# Patient Record
Sex: Female | Born: 1945 | Race: White | Hispanic: No | Marital: Married
Health system: Southern US, Academic
[De-identification: ages and names within clinical notes are randomized; demographics above are authoritative.]

## PROBLEM LIST (undated history)

## (undated) DIAGNOSIS — E7800 Pure hypercholesterolemia, unspecified: Secondary | ICD-10-CM

## (undated) DIAGNOSIS — J45909 Unspecified asthma, uncomplicated: Secondary | ICD-10-CM

## (undated) DIAGNOSIS — C50919 Malignant neoplasm of unspecified site of unspecified female breast: Secondary | ICD-10-CM

## (undated) DIAGNOSIS — R0600 Dyspnea, unspecified: Secondary | ICD-10-CM

## (undated) DIAGNOSIS — K219 Gastro-esophageal reflux disease without esophagitis: Secondary | ICD-10-CM

## (undated) DIAGNOSIS — T4145XA Adverse effect of unspecified anesthetic, initial encounter: Secondary | ICD-10-CM

## (undated) DIAGNOSIS — T7840XA Allergy, unspecified, initial encounter: Secondary | ICD-10-CM

## (undated) DIAGNOSIS — T8859XA Other complications of anesthesia, initial encounter: Secondary | ICD-10-CM

## (undated) DIAGNOSIS — K589 Irritable bowel syndrome without diarrhea: Secondary | ICD-10-CM

## (undated) DIAGNOSIS — M1990 Unspecified osteoarthritis, unspecified site: Secondary | ICD-10-CM

## (undated) DIAGNOSIS — J189 Pneumonia, unspecified organism: Secondary | ICD-10-CM

## (undated) DIAGNOSIS — R011 Cardiac murmur, unspecified: Secondary | ICD-10-CM

## (undated) DIAGNOSIS — F419 Anxiety disorder, unspecified: Secondary | ICD-10-CM

## (undated) DIAGNOSIS — Z8669 Personal history of other diseases of the nervous system and sense organs: Secondary | ICD-10-CM

## (undated) HISTORY — PX: SKIN CANCER EXCISION: SHX779

## (undated) HISTORY — PX: COLONOSCOPY: SHX174

## (undated) HISTORY — PX: BREAST EXCISIONAL BIOPSY: SUR124

## (undated) HISTORY — PX: CHOLECYSTECTOMY: SHX55

## (undated) HISTORY — PX: BREAST BIOPSY: SHX20

## (undated) HISTORY — DX: Allergy, unspecified, initial encounter: T78.40XA

## (undated) HISTORY — PX: HX GALL BLADDER SURGERY/CHOLE: SHX55

## (undated) HISTORY — DX: Pure hypercholesterolemia, unspecified: E78.00

---

## 1965-03-07 HISTORY — PX: DILATION AND CURETTAGE OF UTERUS: SHX78

## 2004-08-23 ENCOUNTER — Ambulatory Visit (HOSPITAL_COMMUNITY): Payer: Self-pay

## 2006-06-16 ENCOUNTER — Other Ambulatory Visit: Payer: Self-pay | Admitting: Surgery

## 2007-03-08 HISTORY — PX: SKIN CANCER EXCISION: SHX779

## 2008-03-07 HISTORY — PX: CHOLECYSTECTOMY: SHX55

## 2008-05-19 ENCOUNTER — Ambulatory Visit (HOSPITAL_COMMUNITY): Payer: Self-pay | Admitting: Specialist

## 2011-02-26 ENCOUNTER — Emergency Department (INDEPENDENT_AMBULATORY_CARE_PROVIDER_SITE_OTHER)
Admission: EM | Admit: 2011-02-26 | Discharge: 2011-02-26 | Disposition: A | Discharge status: Home / Self Care | Payer: Medicare Other | Source: Home / Self Care

## 2011-02-26 ENCOUNTER — Encounter: Payer: Self-pay | Admitting: *Deleted

## 2011-02-26 DIAGNOSIS — M533 Sacrococcygeal disorders, not elsewhere classified: Secondary | ICD-10-CM

## 2011-02-26 HISTORY — DX: Pure hypercholesterolemia, unspecified: E78.00

## 2011-02-26 LAB — POCT URINALYSIS DIP (DEVICE)
Bilirubin Urine: NEGATIVE
Hgb urine dipstick: NEGATIVE
Ketones, ur: NEGATIVE mg/dL
Protein, ur: NEGATIVE mg/dL
Specific Gravity, Urine: 1.025 (ref 1.005–1.030)
pH: 5 (ref 5.0–8.0)

## 2011-02-26 MED ORDER — HYDROCODONE-ACETAMINOPHEN 5-325 MG PO TABS: 2.0000 | ORAL_TABLET | ORAL | Status: DC | PRN

## 2011-02-26 MED ORDER — HYDROCODONE-ACETAMINOPHEN 5-325 MG PO TABS: ORAL_TABLET | ORAL | Status: DC

## 2011-02-26 MED ORDER — DICLOFENAC SODIUM 75 MG PO TBEC: 75.0000 mg | DELAYED_RELEASE_TABLET | Freq: Two times a day (BID) | ORAL | Status: AC

## 2011-02-26 NOTE — ED Notes (Signed)
Pt c/o RIGHT sided lower back, flank, and hip pain x 3 days. Denies known injury/cause. Also reports "urinary problems. It just doesn't feel right when I go." Pt noted to be slow to ambulate.

## 2011-02-26 NOTE — ED Provider Notes (Signed)
Medical screening examination/treatment/procedure(s) were performed by non-physician practitioner and as supervising physician I was immediately available for consultation/collaboration.  LANEY,RONNIE   Ronnie Laney, MD 02/26/11 2109 

## 2011-02-26 NOTE — ED Provider Notes (Signed)
History     CSN: [id number]  Arrival date & time 02/26/11  1241   None     Chief Complaint  Patient presents with  . Back Pain  . Flank Pain  . Hip Pain    (Consider location/radiation/quality/duration/timing/severity/associated sxs/prior treatment) HPI Comments: Pt is visiting from [place] for the Christmas holiday. She began having Rt lower back pain 3 days ago. Pain is predominantly in one spot, with mild discomfort across her lower back. Pain is worse with movement and certain positions. And is also very tender to touch. No radiation to LE, and denies parasthesias. No abd pain, dysuria or urinary frequency.   Patient is a 65 y.o. female presenting with back pain, flank pain, and hip pain.  Back Pain  Pertinent negatives include no chest pain, no fever, no numbness, no abdominal pain and no dysuria.  Flank Pain Pertinent negatives include no chest pain, no abdominal pain and no shortness of breath.  Hip Pain Pertinent negatives include no chest pain, no abdominal pain and no shortness of breath.    Past Medical History  Diagnosis Date  . High cholesterol     History reviewed. No pertinent past surgical history.  Family History  Problem Relation Age of Onset  . Heart disease Other     History  Substance Use Topics  . Smoking status: Never Smoker   . Smokeless tobacco: Never Used  . Alcohol Use: Yes     Occasionally    OB History    Grav Para Term Preterm Abortions TAB SAB Ect Mult Living                  Review of Systems  Constitutional: Negative for fever and chills.  Respiratory: Negative for shortness of breath.   Cardiovascular: Negative for chest pain.  Gastrointestinal: Negative for nausea, vomiting and abdominal pain.  Genitourinary: Negative for dysuria, urgency, frequency and flank pain.  Musculoskeletal: Positive for back pain.  Neurological: Negative for numbness.    Allergies  Tetracyclines & related and [place]  Home  Medications   Current Outpatient Rx  Name Route Sig Dispense Refill  . ASPIRIN 81 MG PO TABS Oral Take 81 mg by mouth daily.      . ATORVASTATIN CALCIUM 40 MG PO TABS Oral Take 40 mg by mouth daily.      . CHOLESTYRAMINE 4 G PO PACK Oral Take 1 packet by mouth 1 day or 1 dose.      Marland Kitchen DICYCLOMINE HCL 10 MG PO CAPS Oral Take 10 mg by mouth 2 (two) times daily with a meal.      . DICLOFENAC SODIUM 75 MG PO TBEC Oral Take 1 tablet (75 mg total) by mouth 2 (two) times daily. 14 tablet 0  . HYDROCODONE-ACETAMINOPHEN 5-325 MG PO TABS  1/2 to one tablet every 6 hrs prn pain 10 tablet 0    BP 144/47  Pulse 61  Temp(Src) 98.7 F (37.1 C) (Oral)  Resp 18  SpO2 98%  Physical Exam  Nursing note and vitals reviewed. Constitutional: She appears well-developed and well-nourished. No distress.  Cardiovascular: Normal rate, regular rhythm and normal heart sounds.   Pulses:      Dorsalis pedis pulses are 2+ on the right side, and 2+ on the left side.       Posterior tibial pulses are 2+ on the right side, and 2+ on the left side.  Pulmonary/Chest: Effort normal and breath sounds normal. No respiratory distress.  Abdominal: Soft.  Bowel sounds are normal. She exhibits no distension and no mass. There is no tenderness.  Musculoskeletal: Normal range of motion.       Right hip: Normal.       Lumbar back: She exhibits tenderness. She exhibits no bony tenderness, no swelling, no deformity and no spasm.       Back:  Neurological: She has normal strength. Gait normal.  Reflex Scores:      Patellar reflexes are 2+ on the right side and 2+ on the left side.   ED Course  Procedures (including critical care time)  Labs Reviewed  POCT URINALYSIS DIP (DEVICE) - Abnormal; Notable for the following:    Leukocytes, UA SMALL (*) Biochemical Testing Only. Please order routine urinalysis from main lab if confirmatory testing is needed.   All other components within normal limits  POCT URINALYSIS DIPSTICK    URINE CULTURE   No results found.   1. Sacroiliac pain       MDM  UA small leuks. No urinary symptoms. Cx ordered.  Tender Rt SI joint non exam.        Melody Comas, PA 02/26/11 1538

## 2011-02-27 LAB — URINE CULTURE
Colony Count: NO GROWTH
Culture  Setup Time: 201212222155
Culture: NO GROWTH

## 2012-03-07 DIAGNOSIS — C50919 Malignant neoplasm of unspecified site of unspecified female breast: Secondary | ICD-10-CM | POA: Insufficient documentation

## 2012-03-07 HISTORY — DX: Malignant neoplasm of unspecified site of unspecified female breast: C50.919

## 2012-10-05 DIAGNOSIS — C50919 Malignant neoplasm of unspecified site of unspecified female breast: Secondary | ICD-10-CM

## 2012-10-05 HISTORY — PX: BREAST BIOPSY: SHX20

## 2012-10-05 HISTORY — DX: Malignant neoplasm of unspecified site of unspecified female breast: C50.919

## 2012-11-05 HISTORY — PX: BREAST LUMPECTOMY: SHX2

## 2012-11-20 ENCOUNTER — Ambulatory Visit (INDEPENDENT_AMBULATORY_CARE_PROVIDER_SITE_OTHER): Payer: Self-pay | Admitting: HEMATOLOGY/ONCOLOGY

## 2014-03-17 DIAGNOSIS — M79672 Pain in left foot: Secondary | ICD-10-CM | POA: Diagnosis not present

## 2014-03-17 DIAGNOSIS — M722 Plantar fascial fibromatosis: Secondary | ICD-10-CM | POA: Diagnosis not present

## 2014-03-17 DIAGNOSIS — M71572 Other bursitis, not elsewhere classified, left ankle and foot: Secondary | ICD-10-CM | POA: Diagnosis not present

## 2014-03-17 DIAGNOSIS — M7732 Calcaneal spur, left foot: Secondary | ICD-10-CM | POA: Diagnosis not present

## 2014-04-11 ENCOUNTER — Encounter (HOSPITAL_COMMUNITY): Payer: Self-pay | Admitting: Vascular Surgery

## 2014-04-11 ENCOUNTER — Inpatient Hospital Stay (HOSPITAL_COMMUNITY)
Admission: EM | Admit: 2014-04-11 | Discharge: 2014-04-12 | DRG: 871 | Disposition: A | Discharge status: Home / Self Care | Payer: Medicare Other | Attending: Internal Medicine | Admitting: Internal Medicine

## 2014-04-11 ENCOUNTER — Emergency Department (INDEPENDENT_AMBULATORY_CARE_PROVIDER_SITE_OTHER)
Admission: EM | Admit: 2014-04-11 | Discharge: 2014-04-11 | Disposition: A | Discharge status: Other Acute Inpatient Hospital | Payer: Medicare Other | Source: Home / Self Care | Attending: Emergency Medicine | Admitting: Emergency Medicine

## 2014-04-11 ENCOUNTER — Encounter (HOSPITAL_COMMUNITY): Payer: Self-pay

## 2014-04-11 ENCOUNTER — Emergency Department (INDEPENDENT_AMBULATORY_CARE_PROVIDER_SITE_OTHER): Payer: Medicare Other

## 2014-04-11 DIAGNOSIS — Z7982 Long term (current) use of aspirin: Secondary | ICD-10-CM | POA: Diagnosis not present

## 2014-04-11 DIAGNOSIS — K589 Irritable bowel syndrome without diarrhea: Secondary | ICD-10-CM | POA: Diagnosis present

## 2014-04-11 DIAGNOSIS — Z91013 Allergy to seafood: Secondary | ICD-10-CM

## 2014-04-11 DIAGNOSIS — Z888 Allergy status to other drugs, medicaments and biological substances status: Secondary | ICD-10-CM | POA: Diagnosis not present

## 2014-04-11 DIAGNOSIS — D0512 Intraductal carcinoma in situ of left breast: Secondary | ICD-10-CM

## 2014-04-11 DIAGNOSIS — N390 Urinary tract infection, site not specified: Secondary | ICD-10-CM | POA: Diagnosis present

## 2014-04-11 DIAGNOSIS — E780 Pure hypercholesterolemia: Secondary | ICD-10-CM | POA: Diagnosis present

## 2014-04-11 DIAGNOSIS — Z881 Allergy status to other antibiotic agents status: Secondary | ICD-10-CM | POA: Diagnosis not present

## 2014-04-11 DIAGNOSIS — A419 Sepsis, unspecified organism: Principal | ICD-10-CM | POA: Diagnosis present

## 2014-04-11 DIAGNOSIS — Z853 Personal history of malignant neoplasm of breast: Secondary | ICD-10-CM | POA: Diagnosis not present

## 2014-04-11 DIAGNOSIS — E785 Hyperlipidemia, unspecified: Secondary | ICD-10-CM | POA: Diagnosis present

## 2014-04-11 DIAGNOSIS — Z9049 Acquired absence of other specified parts of digestive tract: Secondary | ICD-10-CM | POA: Diagnosis present

## 2014-04-11 DIAGNOSIS — J189 Pneumonia, unspecified organism: Secondary | ICD-10-CM | POA: Diagnosis not present

## 2014-04-11 DIAGNOSIS — R0602 Shortness of breath: Secondary | ICD-10-CM | POA: Diagnosis not present

## 2014-04-11 DIAGNOSIS — R05 Cough: Secondary | ICD-10-CM | POA: Diagnosis not present

## 2014-04-11 DIAGNOSIS — C50919 Malignant neoplasm of unspecified site of unspecified female breast: Secondary | ICD-10-CM | POA: Diagnosis not present

## 2014-04-11 HISTORY — DX: Malignant neoplasm of unspecified site of unspecified female breast: C50.919

## 2014-04-11 LAB — COMPREHENSIVE METABOLIC PANEL
ALK PHOS: 53 U/L (ref 39–117)
ALT: 30 U/L (ref 0–35)
ANION GAP: 8 (ref 5–15)
AST: 36 U/L (ref 0–37)
Albumin: 3.5 g/dL (ref 3.5–5.2)
BUN: 14 mg/dL (ref 6–23)
CO2: 26 mmol/L (ref 19–32)
CREATININE: 1.27 mg/dL — AB (ref 0.50–1.10)
Calcium: 8.8 mg/dL (ref 8.4–10.5)
Chloride: 102 mmol/L (ref 96–112)
GFR calc Af Amer: 49 mL/min — ABNORMAL LOW (ref >90)
GFR, EST NON AFRICAN AMERICAN: 42 mL/min — AB (ref >90)
GLUCOSE: 136 mg/dL — AB (ref 70–99)
POTASSIUM: 3.9 mmol/L (ref 3.5–5.1)
SODIUM: 136 mmol/L (ref 135–145)
Total Bilirubin: 0.5 mg/dL (ref 0.3–1.2)
Total Protein: 6.8 g/dL (ref 6.0–8.3)

## 2014-04-11 LAB — I-STAT CHEM 8, ED
BUN: 14 mg/dL (ref 6–23)
CALCIUM ION: 1.06 mmol/L — AB (ref 1.13–1.30)
CHLORIDE: 101 mmol/L (ref 96–112)
Creatinine, Ser: 1.2 mg/dL — ABNORMAL HIGH (ref 0.50–1.10)
GLUCOSE: 135 mg/dL — AB (ref 70–99)
HCT: 39 % (ref 36.0–46.0)
HEMOGLOBIN: 13.3 g/dL (ref 12.0–15.0)
Potassium: 3.8 mmol/L (ref 3.5–5.1)
SODIUM: 136 mmol/L (ref 135–145)
TCO2: 21 mmol/L (ref 0–100)

## 2014-04-11 LAB — URINALYSIS, ROUTINE W REFLEX MICROSCOPIC
Bilirubin Urine: NEGATIVE
Glucose, UA: NEGATIVE mg/dL
Ketones, ur: 15 mg/dL — AB
Nitrite: NEGATIVE
PROTEIN: NEGATIVE mg/dL
SPECIFIC GRAVITY, URINE: 1.007 (ref 1.005–1.030)
UROBILINOGEN UA: 0.2 mg/dL (ref 0.0–1.0)
pH: 5.5 (ref 5.0–8.0)

## 2014-04-11 LAB — CBC WITH DIFFERENTIAL/PLATELET
BASOS ABS: 0 10*3/uL (ref 0.0–0.1)
Basophils Relative: 0 % (ref 0–1)
EOS ABS: 0 10*3/uL (ref 0.0–0.7)
EOS PCT: 0 % (ref 0–5)
HEMATOCRIT: 36.6 % (ref 36.0–46.0)
HEMOGLOBIN: 12.4 g/dL (ref 12.0–15.0)
LYMPHS PCT: 9 % — AB (ref 12–46)
Lymphs Abs: 0.7 10*3/uL (ref 0.7–4.0)
MCH: 32 pg (ref 26.0–34.0)
MCHC: 33.9 g/dL (ref 30.0–36.0)
MCV: 94.3 fL (ref 78.0–100.0)
MONOS PCT: 8 % (ref 3–12)
Monocytes Absolute: 0.6 10*3/uL (ref 0.1–1.0)
NEUTROS ABS: 6.4 10*3/uL (ref 1.7–7.7)
Neutrophils Relative %: 83 % — ABNORMAL HIGH (ref 43–77)
Platelets: 150 10*3/uL (ref 150–400)
RBC: 3.88 MIL/uL (ref 3.87–5.11)
RDW: 12.8 % (ref 11.5–15.5)
WBC: 7.7 10*3/uL (ref 4.0–10.5)

## 2014-04-11 LAB — URINE MICROSCOPIC-ADD ON

## 2014-04-11 LAB — POCT RAPID STREP A: Streptococcus, Group A Screen (Direct): NEGATIVE

## 2014-04-11 LAB — I-STAT CG4 LACTIC ACID, ED: Lactic Acid, Venous: 0.94 mmol/L (ref 0.5–2.0)

## 2014-04-11 MED ORDER — DOCUSATE SODIUM 100 MG PO CAPS
100.0000 mg | ORAL_CAPSULE | Freq: Two times a day (BID) | ORAL | Status: DC
  Filled 2014-04-11 (×3): qty 1

## 2014-04-11 MED ORDER — CHOLESTYRAMINE 4 G PO PACK
1.0000 | PACK | Freq: Every day | ORAL | Status: DC
  Filled 2014-04-11 (×2): qty 1

## 2014-04-11 MED ORDER — SODIUM CHLORIDE 0.9 % IV SOLN
INTRAVENOUS | Status: DC
  Administered 2014-04-11: 19:00:00 via INTRAVENOUS

## 2014-04-11 MED ORDER — GUAIFENESIN ER 600 MG PO TB12
600.0000 mg | ORAL_TABLET | Freq: Two times a day (BID) | ORAL | Status: DC
  Filled 2014-04-11 (×3): qty 1

## 2014-04-11 MED ORDER — ATORVASTATIN CALCIUM 40 MG PO TABS
40.0000 mg | ORAL_TABLET | Freq: Every day | ORAL | Status: DC
  Filled 2014-04-11 (×2): qty 1

## 2014-04-11 MED ORDER — ALBUTEROL SULFATE (2.5 MG/3ML) 0.083% IN NEBU: 2.0000 mL | INHALATION_SOLUTION | Freq: Four times a day (QID) | RESPIRATORY_TRACT | Status: DC | PRN

## 2014-04-11 MED ORDER — CEFTRIAXONE SODIUM IN DEXTROSE 20 MG/ML IV SOLN
1.0000 g | INTRAVENOUS | Status: DC
  Filled 2014-04-11: qty 50

## 2014-04-11 MED ORDER — TAMOXIFEN CITRATE 20 MG PO TABS
20.0000 mg | ORAL_TABLET | Freq: Every day | ORAL | Status: DC
  Administered 2014-04-11: 20 mg via ORAL
  Filled 2014-04-11 (×2): qty 1

## 2014-04-11 MED ORDER — ASPIRIN EC 81 MG PO TBEC
81.0000 mg | DELAYED_RELEASE_TABLET | ORAL | Status: DC
  Administered 2014-04-11: 81 mg via ORAL
  Filled 2014-04-11: qty 1

## 2014-04-11 MED ORDER — DEXTROSE 5 % IV SOLN
500.0000 mg | INTRAVENOUS | Status: DC
  Filled 2014-04-11: qty 500

## 2014-04-11 MED ORDER — DEXTROSE 5 % IV SOLN
1.0000 g | INTRAVENOUS | Status: DC
  Filled 2014-04-11: qty 10

## 2014-04-11 MED ORDER — AZITHROMYCIN 500 MG IV SOLR
500.0000 mg | Freq: Once | INTRAVENOUS | Status: AC
  Administered 2014-04-11: 500 mg via INTRAVENOUS
  Filled 2014-04-11: qty 500

## 2014-04-11 MED ORDER — ASPIRIN 81 MG PO TABS: 81.0000 mg | ORAL_TABLET | ORAL | Status: DC

## 2014-04-11 MED ORDER — ACETAMINOPHEN 325 MG PO TABS
650.0000 mg | ORAL_TABLET | Freq: Once | ORAL | Status: AC
  Administered 2014-04-11: 650 mg via ORAL
  Filled 2014-04-11: qty 2

## 2014-04-11 MED ORDER — HEPARIN SODIUM (PORCINE) 5000 UNIT/ML IJ SOLN
5000.0000 [IU] | Freq: Three times a day (TID) | INTRAMUSCULAR | Status: DC
  Filled 2014-04-11 (×4): qty 1

## 2014-04-11 MED ORDER — ACETAMINOPHEN 650 MG RE SUPP
650.0000 mg | Freq: Four times a day (QID) | RECTAL | Status: DC | PRN
Start: 2014-04-11 — End: 2014-04-12

## 2014-04-11 MED ORDER — DICYCLOMINE HCL 10 MG PO CAPS
10.0000 mg | ORAL_CAPSULE | Freq: Every day | ORAL | Status: DC
  Filled 2014-04-11 (×2): qty 1

## 2014-04-11 MED ORDER — ALBUTEROL SULFATE (2.5 MG/3ML) 0.083% IN NEBU: 2.5000 mg | INHALATION_SOLUTION | RESPIRATORY_TRACT | Status: DC | PRN

## 2014-04-11 MED ORDER — DIPHENHYDRAMINE HCL 50 MG/ML IJ SOLN
25.0000 mg | Freq: Once | INTRAMUSCULAR | Status: AC
  Administered 2014-04-11: 25 mg via INTRAVENOUS
  Filled 2014-04-11: qty 1

## 2014-04-11 MED ORDER — ACETAMINOPHEN 325 MG PO TABS
650.0000 mg | ORAL_TABLET | Freq: Four times a day (QID) | ORAL | Status: DC | PRN
  Administered 2014-04-11 – 2014-04-12 (×2): 650 mg via ORAL
  Filled 2014-04-11 (×3): qty 2

## 2014-04-11 MED ORDER — SENNA 8.6 MG PO TABS
1.0000 | ORAL_TABLET | Freq: Two times a day (BID) | ORAL | Status: DC
  Filled 2014-04-11 (×3): qty 1

## 2014-04-11 MED ORDER — DEXTROSE 5 % IV SOLN
1.0000 g | Freq: Once | INTRAVENOUS | Status: AC
  Administered 2014-04-11: 1 g via INTRAVENOUS
  Filled 2014-04-11: qty 10

## 2014-04-11 MED ORDER — SODIUM CHLORIDE 0.9 % IV BOLUS (SEPSIS)
1000.0000 mL | INTRAVENOUS | Status: AC
  Administered 2014-04-11 (×3): 1000 mL via INTRAVENOUS

## 2014-04-11 MED ORDER — SODIUM CHLORIDE 0.9 % IV SOLN
Freq: Once | INTRAVENOUS | Status: AC
  Administered 2014-04-11: 12:00:00 via INTRAVENOUS

## 2014-04-11 MED ORDER — ONDANSETRON HCL 4 MG/2ML IJ SOLN: 4.0000 mg | Freq: Four times a day (QID) | INTRAMUSCULAR | Status: DC | PRN

## 2014-04-11 MED ORDER — ONDANSETRON HCL 4 MG PO TABS: 4.0000 mg | ORAL_TABLET | Freq: Four times a day (QID) | ORAL | Status: DC | PRN

## 2014-04-11 NOTE — ED Notes (Signed)
Discussed need for antipyretics . Patient declines tylenol at this point

## 2014-04-11 NOTE — ED Notes (Signed)
C/o cough x 2 days, dizzy x 1 week. Dizzy when changes position too quickly, easily fatigues. History of breast CA

## 2014-04-11 NOTE — ED Notes (Signed)
Attempted report 

## 2014-04-11 NOTE — Progress Notes (Signed)
Pt admitted to the unit at 1845. Pt mental status is alert and oriented. Pt oriented to room, staff, and call bell. Skin is intact. Full assessment charted in [facility]. Call bell within reach. Visitor guidelines reviewed w/ pt and/or family.

## 2014-04-11 NOTE — ED Notes (Signed)
Lab at bedside drawing blood cultures 

## 2014-04-11 NOTE — ED Notes (Signed)
Pt watching tv, still cannot urinate at this time; no needs at this time; visitor at bedside

## 2014-04-11 NOTE — H&P (Signed)
Patient Demographics  Colleen Hancock, is a 69 y.o. female  MRN: [id number]   DOB - 1945-04-08  Admit Date - 04/11/2014  Outpatient Primary MD for the patient is No primary care provider on file.   With History of -  Past Medical History  Diagnosis Date  . High cholesterol   . Breast CA       Past Surgical History  Procedure Laterality Date  . Cholecystectomy      in for   Chief Complaint  Patient presents with  . Pneumonia     HPI  Colleen Hancock  is a 69 y.o. female, with past medical history of hyperlipidemia, breast cancer in remission, presents with complaints of shortness of breath, cough, weakness, fever and chills, patient is visiting from [place], reports she's been in [place] for the last 2 month, denies any sick contacts, no one sick at home, reports she took her to onset vaccine this year, reports she feels congested, but she can't produce any sputum, patient was febrile in ED 102.1, tachypneic, hypoxic on exertion, chest x-ray was significant for left upper lobe infiltrate, patient was started on IV Rocephin and azithromycin for community-acquired pneumonia after blood cultures were done, patient urinalysis significant for UTI.  Review of Systems    In addition to the HPI above, Reports fever and chills No Headache, No changes with Vision or hearing, No problems swallowing food or Liquids, No Chest pain, and planes of Cough and Shortness of Breath, No Abdominal pain, No Nausea or Vommitting, Bowel movements are regular, No Blood in stool or Urine, No dysuria, No new skin rashes or bruises, No new joints pains-aches,  No new weakness, tingling, numbness in any extremity, No recent weight gain or loss, No polyuria, polydypsia or polyphagia, No significant Mental Stressors.  A full 10 point Review of Systems was done, except as stated above, all other Review of Systems were negative.   Social History History  Substance Use Topics  .  Smoking status: Never Smoker   . Smokeless tobacco: Never Used  . Alcohol Use: Yes     Comment: Occasionally     Family History Family History  Problem Relation Age of Onset  . Heart disease Other     Prior to Admission medications   Medication Sig Start Date End Date Taking? Authorizing Provider  albuterol (PROVENTIL HFA;VENTOLIN HFA) 108 (90 BASE) MCG/ACT inhaler Inhale 1 puff into the lungs every 6 (six) hours as needed for wheezing or shortness of breath.     Historical Provider, MD  aspirin 81 MG tablet Take 81 mg by mouth every other day.     Historical Provider, MD  atorvastatin (LIPITOR) 40 MG tablet Take 40 mg by mouth daily.      Historical Provider, MD  cholestyramine Lucrezia Starch) 4 G packet Take 1 packet by mouth 1 day or 1 dose.      Historical Provider, MD  dicyclomine (BENTYL) 10 MG capsule Take 10 mg by mouth daily.     Historical Provider, MD  tamoxifen (NOLVADEX) 20 MG tablet Take 20 mg by mouth daily.    Historical Provider, MD    Allergies  Allergen Reactions  . Tetracyclines & Related Hives  . Pentazocine Lactate Nausea And Vomiting  . Clams [Shellfish Allergy] Diarrhea    Physical Exam  Vitals  Blood pressure 121/60, pulse 85, temperature 100 F (37.8 C), temperature source Oral, resp. rate 25, height 5\' 3"  (1.6 m), weight 95.255 kg (210 lb),  SpO2 98 %.   1. General  lying in bed in NAD,    2. Normal affect and insight, Not Suicidal or Homicidal, Awake Alert, Oriented X 3.  3. No F.N deficits, ALL C.Nerves Intact, Strength 5/5 all 4 extremities, Sensation intact all 4 extremities, Plantars down going.  4. Ears and Eyes appear Normal, Conjunctivae clear, PERRLA. Moist Oral Mucosa.  5. Supple Neck, No JVD, No cervical lymphadenopathy appriciated, No Carotid Bruits.  6. Symmetrical Chest wall movement, Good air movement bilaterally, no wheezing.  7. RRR, No Gallops, Rubs or Murmurs, No Parasternal Heave.  8. Positive Bowel Sounds, Abdomen Soft, No  tenderness, No organomegaly appriciated,No rebound -guarding or rigidity.  9.  No Cyanosis, Normal Skin Turgor, No Skin Rash or Bruise.  10. Good muscle tone,  joints appear normal , no effusions, Normal ROM.      Data Review  CBC  Recent Labs Lab 04/11/14 1305 04/11/14 1404  WBC 7.7  --   HGB 12.4 13.3  HCT 36.6 39.0  PLT 150  --   MCV 94.3  --   MCH 32.0  --   MCHC 33.9  --   RDW 12.8  --   LYMPHSABS 0.7  --   MONOABS 0.6  --   EOSABS 0.0  --   BASOSABS 0.0  --    ------------------------------------------------------------------------------------------------------------------  Chemistries   Recent Labs Lab 04/11/14 1305 04/11/14 1404  NA 136 136  K 3.9 3.8  CL 102 101  CO2 26  --   GLUCOSE 136* 135*  BUN 14 14  CREATININE 1.27* 1.20*  CALCIUM 8.8  --   AST 36  --   ALT 30  --   ALKPHOS 53  --   BILITOT 0.5  --    ------------------------------------------------------------------------------------------------------------------ estimated creatinine clearance is 48.6 mL/min (by C-G formula based on Cr of 1.2). ------------------------------------------------------------------------------------------------------------------ No results for input(s): TSH, T4TOTAL, T3FREE, THYROIDAB in the last 72 hours.  Invalid input(s): FREET3   Coagulation profile No results for input(s): INR, PROTIME in the last 168 hours. ------------------------------------------------------------------------------------------------------------------- No results for input(s): DDIMER in the last 72 hours. -------------------------------------------------------------------------------------------------------------------  Cardiac Enzymes No results for input(s): CKMB, TROPONINI, MYOGLOBIN in the last 168 hours.  Invalid input(s): CK ------------------------------------------------------------------------------------------------------------------ Invalid input(s):  POCBNP   ---------------------------------------------------------------------------------------------------------------  Urinalysis    Component Value Date/Time   COLORURINE YELLOW 04/11/2014 1556   APPEARANCEUR CLEAR 04/11/2014 1556   LABSPEC 1.007 04/11/2014 1556   PHURINE 5.5 04/11/2014 1556   GLUCOSEU NEGATIVE 04/11/2014 1556   HGBUR TRACE* 04/11/2014 1556   BILIRUBINUR NEGATIVE 04/11/2014 1556   KETONESUR 15* 04/11/2014 1556   PROTEINUR NEGATIVE 04/11/2014 1556   UROBILINOGEN 0.2 04/11/2014 1556   NITRITE NEGATIVE 04/11/2014 1556   LEUKOCYTESUR TRACE* 04/11/2014 1556    ----------------------------------------------------------------------------------------------------------------  Imaging results:   Dg Chest 2 View  04/11/2014   CLINICAL DATA:  Two day history of cough  EXAM: CHEST  2 VIEW  COMPARISON:  None.  FINDINGS: There is patchy infiltrate in the anterior segment of the left upper lobe. There is mild left base scarring. Lungs elsewhere clear. Heart size and pulmonary vascularity are normal. No adenopathy. No bone lesions.  IMPRESSION: Infiltrate left upper lobe anterior segment. Mild scarring left base. Elsewhere lungs clear.  These results will be called to the ordering clinician or representative by the Radiologist Assistant, and communication documented in the PACS or zVision Dashboard.   Electronically Signed   By: Lowella Grip M.D.   On: 04/11/2014 11:28  My personal review of EKG: Rhythm NSR, Rate  88 /min, QTc 423 , no Acute ST changes    Assessment & Plan  Active Problems:   PNA (pneumonia)   Hyperlipemia   Breast cancer   Sepsis    Sepsis -Patient presents with fever, tachypnea, sepsis-most likely secondary to community-acquired pneumonia and UTI. -Follow on blood cultures, continue with gentle hydration  Community-acquired pneumonia -We'll start on Rocephin and azithromycin - When necessary nebulizer as needed, oxygen as needed, Mucinex,  flutter valve.  UTI -Continue with Rocephin, follow on urine cultures.  Hyperlipidemia -Continue with statin  History of breast cancer -Continue with tamoxifen   DVT Prophylaxis Heparin   AM Labs Ordered, also please review Full Orders  Family Communication: Admission, patients condition and plan of care including tests being ordered have been discussed with the patient and husband  who indicate understanding and agree with the plan and Code Status.  Code Status Full  Likely DC to  Home when stable  Condition GUARDED    Time spent in minutes : 55 minutes    ELGERGAWY, DAWOOD M.D on 04/11/2014 at 4:38 PM  Between 7am to 7pm - Pager - [phone]  After 7pm go to [email or web address] - password [email or web address]  And look for the night coverage person covering me after hours  [facility] Office  [phone]   **Disclaimer: This note may have been dictated with voice recognition software. Similar sounding words can inadvertently be transcribed and this note may contain transcription errors which may not have been corrected upon publication of note.**

## 2014-04-11 NOTE — ED Notes (Signed)
Pt called out to report rash on right arm. This nurse assessed pts arm. Pt has small red spot on arm which Zithromax is going in. Reported this to primary RN and Dr. Kathrynn Humble. Dr. Kathrynn Humble instructed this RN to slow infusion to 125 an hour.

## 2014-04-11 NOTE — ED Notes (Signed)
Pt reports to the ED from [facility] for eval of PNA. Pt reports a non-productive cough x 2 days. She has been trying to use OTC cough medications with no relief. She does take Tamoxifin every day for her hx of breat cancer. Last dose was 2 days ago. Pt febrile at 102.1. Denies any CP or SOB. Reports she has been having some dizziness since the end of January. Chest X-ray shows left upper lobe infiltrate. Reports she has had some increased belching. Denies any N/V. Pt reports decreased appetite and malaise. PT NSR on the monitor. RA sats 93%. PtA&Ox4, resp e/u, and skin very warm and dry.

## 2014-04-11 NOTE — ED Notes (Signed)
Pt made aware of need of urine specimen; pt stated "a nurse just took me to the bathroom, she didn't tell me she needed one"; informed pt to let us know when she has to go again so a sample can be collected; pt acknowledged

## 2014-04-11 NOTE — ED Provider Notes (Signed)
CSN: [id number]     Arrival date & time 04/11/14  1228 History   First MD Initiated Contact with Patient 04/11/14 1301     Chief Complaint  Patient presents with  . Pneumonia     (Consider location/radiation/quality/duration/timing/severity/associated sxs/prior Treatment) HPI   69 year old female with history of breast cancer, hypercholesterolemia and prior surgical history of cholecystectomy who was sent here from urgent care center for further management of pneumonia. For the past 2 days patient has endorsed the fever and a cough. She reported having sore throat, generalized fatigue, increased nausea with increased belching. She has decreased appetite, and for the past 2 weeks she has been lightheadedness, dizziness and difficulty walking. She also endorses dizziness worsen with positional change. She's been drinking fluids but haven't been eating much due to her loss of appetite. Denies any significant headache, neck pain, chest pain, increased shortness of breath, abdominal pain, back pain, dysuria, rash. She currently taking tamoxifen for breast cancer and has been doing so for the past 14 months. Her primary care doctor is in [place], she is here to visit her daughter and has been staying since last November.  Past Medical History  Diagnosis Date  . High cholesterol   . Breast CA    Past Surgical History  Procedure Laterality Date  . Cholecystectomy     Family History  Problem Relation Age of Onset  . Heart disease Other    History  Substance Use Topics  . Smoking status: Never Smoker   . Smokeless tobacco: Never Used  . Alcohol Use: Yes     Comment: Occasionally   OB History    No data available     Review of Systems  All other systems reviewed and are negative.     Allergies  Tetracyclines & related; Pentazocine lactate; and Clams  Home Medications   Prior to Admission medications   Medication Sig Start Date End Date Taking? Authorizing Provider   albuterol (PROVENTIL HFA;VENTOLIN HFA) 108 (90 BASE) MCG/ACT inhaler Inhale into the lungs every 6 (six) hours as needed for wheezing or shortness of breath.    Historical Provider, MD  aspirin 81 MG tablet Take 81 mg by mouth daily.      Historical Provider, MD  atorvastatin (LIPITOR) 40 MG tablet Take 40 mg by mouth daily.      Historical Provider, MD  cholestyramine Lucrezia Starch) 4 G packet Take 1 packet by mouth 1 day or 1 dose.      Historical Provider, MD  dicyclomine (BENTYL) 10 MG capsule Take 10 mg by mouth 2 (two) times daily with a meal.      Historical Provider, MD  HYDROcodone-acetaminophen (NORCO) 5-325 MG per tablet 1/2 to one tablet every 6 hrs prn pain 02/26/11   Carmel [place], PA-C  tamoxifen (NOLVADEX) 20 MG tablet Take 20 mg by mouth daily.    Historical Provider, MD   BP 137/48 mmHg  Pulse 90  Temp(Src) 102.1 F (38.9 C) (Oral)  Resp 20  Ht 5\' 3"  (1.6 m)  Wt 210 lb (95.255 kg)  BMI 37.21 kg/m2  SpO2 95% Physical Exam  Constitutional: She is oriented to person, place, and time. She appears well-developed and well-nourished. No distress.  HENT:  Head: Atraumatic.  Mouth/Throat: Oropharynx is clear and moist.  Eyes: Conjunctivae are normal.  Neck: Normal range of motion. Neck supple.  Cardiovascular: Normal rate and regular rhythm.  Exam reveals no gallop and no friction rub.   No murmur heard. Pulmonary/Chest:  Effort normal and breath sounds normal. No respiratory distress. She has no wheezes. She has no rales. She exhibits no tenderness.  Abdominal: Soft. There is no tenderness.  Musculoskeletal: She exhibits no edema.  Lymphadenopathy:    She has no cervical adenopathy.  Neurological: She is alert and oriented to person, place, and time.  Skin: No rash noted.  Psychiatric: She has a normal mood and affect.  Nursing note and vitals reviewed.   ED Course  Procedures (including critical care time)  Patient sent here for a chest x-ray that shows pneumonia.  This is a community-acquired pneumonia as patient has not been hospitalized recently. She meets sepsis criteria based on temperature, tachypnea and a positive chest x-ray for pneumonia. Code sepsis initiated.  CURB-65 = 2 (RR 30,  Age>65)  3:10 PM Patient has positive orthostatic vital signs. She was tachypnea with respiratory rate 30. At one point she became hypoxic with an O2 89%, oxygen saturation given. She is febrile with temperature of 102.1, Tylenol given.  ABx given, IVF given, BP is stable.  Plan to consult for admission.    3:35 PM i have consulted with [facility]. Waldron [facility] who agrees to admit pt for further care.    CRITICAL CARE Performed by: Domenic Moras Total critical care time: 30 min Critical care time was exclusive of separately billable procedures and treating other patients. Critical care was necessary to treat or prevent imminent or life-threatening deterioration. Critical care was time spent personally by me on the following activities: development of treatment plan with patient and/or surrogate as well as nursing, discussions with consultants, evaluation of patient's response to treatment, examination of patient, obtaining history from patient or surrogate, ordering and performing treatments and interventions, ordering and review of laboratory studies, ordering and review of radiographic studies, pulse oximetry and re-evaluation of patient's condition.  Labs Review Labs Reviewed  CBC WITH DIFFERENTIAL/PLATELET - Abnormal; Notable for the following:    Neutrophils Relative % 83 (*)    Lymphocytes Relative 9 (*)    All other components within normal limits  COMPREHENSIVE METABOLIC PANEL - Abnormal; Notable for the following:    Glucose, Bld 136 (*)    Creatinine, Ser 1.27 (*)    GFR calc non Af Amer 42 (*)    GFR calc Af Amer 49 (*)    All other components within normal limits  I-STAT CHEM 8, ED - Abnormal; Notable for the following:    Creatinine, Ser 1.20  (*)    Glucose, Bld 135 (*)    Calcium, Ion 1.06 (*)    All other components within normal limits  CULTURE, GROUP A STREP  CULTURE, BLOOD (ROUTINE X 2)  CULTURE, BLOOD (ROUTINE X 2)  URINE CULTURE  URINALYSIS, ROUTINE W REFLEX MICROSCOPIC  I-STAT CG4 LACTIC ACID, ED    Imaging Review Dg Chest 2 View  04/11/2014   CLINICAL DATA:  Two day history of cough  EXAM: CHEST  2 VIEW  COMPARISON:  None.  FINDINGS: There is patchy infiltrate in the anterior segment of the left upper lobe. There is mild left base scarring. Lungs elsewhere clear. Heart size and pulmonary vascularity are normal. No adenopathy. No bone lesions.  IMPRESSION: Infiltrate left upper lobe anterior segment. Mild scarring left base. Elsewhere lungs clear.  These results will be called to the ordering clinician or representative by the Radiologist Assistant, and communication documented in the PACS or zVision Dashboard.   Electronically Signed   By: Lowella Grip M.D.  On: 04/11/2014 11:28     EKG Interpretation   Date/Time:  Friday April 11 2014 12:49:20 EST Ventricular Rate:  88 PR Interval:  156 QRS Duration: 84 QT Interval:  391 QTC Calculation: 473 R Axis:   29 Text Interpretation:  Sinus rhythm Atrial premature complex No previous  ECGs available Confirmed by Kathrynn Humble, MD, Thelma Comp [phone]) on 04/11/2014  1:05:49 PM      MDM   Final diagnoses:  CAP (community acquired pneumonia)    BP 121/60 mmHg  Pulse 85  Temp(Src) 100 F (37.8 C) (Oral)  Resp 25  Ht 5\' 3"  (1.6 m)  Wt 210 lb (95.255 kg)  BMI 37.21 kg/m2  SpO2 98%  I have reviewed nursing notes and vital signs. I personally reviewed the imaging tests through PACS system  I reviewed available ER/hospitalization records thought the EMR     Domenic Moras, PA-C 04/11/14 [place], MD 04/15/14 1058

## 2014-04-11 NOTE — ED Notes (Signed)
Refused Tylenol because she is on the Tamoxifin. She states she was told to take Tramadol.

## 2014-04-11 NOTE — ED Provider Notes (Signed)
CSN: [id number]     Arrival date & time 04/11/14  1003 History   First MD Initiated Contact with Patient 04/11/14 1025     Chief Complaint  Patient presents with  . Fever   (Consider location/radiation/quality/duration/timing/severity/associated sxs/prior Treatment) HPI She is a 69 year old woman here with her husband for evaluation of fever. For the last 2 days she has had fever and cough. This is associated with a sore throat and fatigue. No nasal discharge or congestion. She denies any shortness of breath. She does report nausea and increased belching. No vomiting or diarrhea. She reports decreased appetite. She is tolerating fluids. She has had fever to 102 at home.    She also reports 2 weeks of dizziness and disequilibrium. She states when she is lying in the bed every time she turns to the left the bed spins. Her husband also states she has difficulty walking a straight line.  Past Medical History  Diagnosis Date  . High cholesterol   . Breast CA    Past Surgical History  Procedure Laterality Date  . Cholecystectomy     Family History  Problem Relation Age of Onset  . Heart disease Other    History  Substance Use Topics  . Smoking status: Never Smoker   . Smokeless tobacco: Never Used  . Alcohol Use: Yes     Comment: Occasionally   OB History    No data available     Review of Systems  Constitutional: Positive for fever, appetite change and fatigue.  HENT: Positive for sinus pressure. Negative for congestion, rhinorrhea and trouble swallowing.   Respiratory: Positive for cough. Negative for shortness of breath.   Cardiovascular: Negative for chest pain.  Gastrointestinal: Positive for nausea. Negative for vomiting, abdominal pain and diarrhea.  Musculoskeletal: Negative for myalgias.  Neurological: Positive for dizziness.    Allergies  Tetracyclines & related and Pentazocine lactate  Home Medications   Prior to Admission medications   Medication Sig Start  Date End Date Taking? Authorizing Provider  aspirin 81 MG tablet Take 81 mg by mouth daily.     Yes Historical Provider, MD  atorvastatin (LIPITOR) 40 MG tablet Take 40 mg by mouth daily.     Yes Historical Provider, MD  cholestyramine Lucrezia Starch) 4 G packet Take 1 packet by mouth 1 day or 1 dose.     Yes Historical Provider, MD  dicyclomine (BENTYL) 10 MG capsule Take 10 mg by mouth 2 (two) times daily with a meal.     Yes Historical Provider, MD  albuterol (PROVENTIL HFA;VENTOLIN HFA) 108 (90 BASE) MCG/ACT inhaler Inhale into the lungs every 6 (six) hours as needed for wheezing or shortness of breath.    Historical Provider, MD  HYDROcodone-acetaminophen [facility]) 5-325 MG per tablet 1/2 to one tablet every 6 hrs prn pain 02/26/11   Carmel Sacramento, PA-C  tamoxifen (NOLVADEX) 20 MG tablet Take 20 mg by mouth daily.    Historical Provider, MD   BP 135/68 mmHg  Pulse 98  Temp(Src) 102.2 F (39 C) (Oral)  Resp 20  SpO2 93% Physical Exam  Constitutional: She is oriented to person, place, and time. She appears well-developed and well-nourished. She appears distressed (mild).  HENT:  Head: Normocephalic and atraumatic.  Nose: Nose normal.  Mouth/Throat: Posterior oropharyngeal erythema present. No oropharyngeal exudate.  Neck: Neck supple.  Cardiovascular: Normal rate, regular rhythm and normal heart sounds.   No murmur heard. Pulmonary/Chest: Effort normal and breath sounds normal. No respiratory distress. She  has no wheezes. She has no rales.  Lymphadenopathy:    She has no cervical adenopathy.  Neurological: She is alert and oriented to person, place, and time.    ED Course  Procedures (including critical care time) Labs Review Labs Reviewed - No data to display  Imaging Review Dg Chest 2 View  04/11/2014   CLINICAL DATA:  Two day history of cough  EXAM: CHEST  2 VIEW  COMPARISON:  None.  FINDINGS: There is patchy infiltrate in the anterior segment of the left upper lobe. There is mild  left base scarring. Lungs elsewhere clear. Heart size and pulmonary vascularity are normal. No adenopathy. No bone lesions.  IMPRESSION: Infiltrate left upper lobe anterior segment. Mild scarring left base. Elsewhere lungs clear.  These results will be called to the ordering clinician or representative by the Radiologist Assistant, and communication documented in the PACS or zVision Dashboard.   Electronically Signed   By: Lowella Grip M.D.   On: 04/11/2014 11:28     MDM   1. CAP (community acquired pneumonia)    X-ray shows pneumonia. Orthostatic vital signs are negative from a number standpoint but she reports symptomatic dizziness. We'll start IV and transfer to [facility] ER. Pneumonia severity score is indicate need for short-term admission. She also would likely benefit from additional workup of her vertigo and dizziness as she does have breast cancer.    Melony Overly, MD 04/11/14 [phone]

## 2014-04-11 NOTE — ED Notes (Signed)
Pts O2 sats dropped to 89% on RA. Pt placed on 2 L via nasal cannula and O2 now >92%. EDP made aware.

## 2014-04-11 NOTE — ED Notes (Signed)
RN attempted to obtain blood cultures IV flushes well but unable to draw blood. Phlebotomy called.

## 2014-04-12 DIAGNOSIS — C50919 Malignant neoplasm of unspecified site of unspecified female breast: Secondary | ICD-10-CM

## 2014-04-12 DIAGNOSIS — E785 Hyperlipidemia, unspecified: Secondary | ICD-10-CM

## 2014-04-12 DIAGNOSIS — A419 Sepsis, unspecified organism: Principal | ICD-10-CM

## 2014-04-12 DIAGNOSIS — J189 Pneumonia, unspecified organism: Secondary | ICD-10-CM

## 2014-04-12 LAB — CBC
HCT: 33.8 % — ABNORMAL LOW (ref 36.0–46.0)
HEMOGLOBIN: 11.2 g/dL — AB (ref 12.0–15.0)
MCH: 31.6 pg (ref 26.0–34.0)
MCHC: 33.1 g/dL (ref 30.0–36.0)
MCV: 95.5 fL (ref 78.0–100.0)
Platelets: 133 10*3/uL — ABNORMAL LOW (ref 150–400)
RBC: 3.54 MIL/uL — ABNORMAL LOW (ref 3.87–5.11)
RDW: 12.9 % (ref 11.5–15.5)
WBC: 6 10*3/uL (ref 4.0–10.5)

## 2014-04-12 LAB — BASIC METABOLIC PANEL
Anion gap: 9 (ref 5–15)
BUN: 9 mg/dL (ref 6–23)
CO2: 20 mmol/L (ref 19–32)
CREATININE: 1.12 mg/dL — AB (ref 0.50–1.10)
Calcium: 7.7 mg/dL — ABNORMAL LOW (ref 8.4–10.5)
Chloride: 109 mmol/L (ref 96–112)
GFR calc Af Amer: 57 mL/min — ABNORMAL LOW (ref >90)
GFR calc non Af Amer: 49 mL/min — ABNORMAL LOW (ref >90)
Glucose, Bld: 146 mg/dL — ABNORMAL HIGH (ref 70–99)
Potassium: 3.6 mmol/L (ref 3.5–5.1)
SODIUM: 138 mmol/L (ref 135–145)

## 2014-04-12 MED ORDER — LEVOFLOXACIN 750 MG PO TABS: 750.0000 mg | ORAL_TABLET | Freq: Every day | ORAL | Status: DC

## 2014-04-12 NOTE — Discharge Summary (Signed)
Physician Discharge Summary  Colleen Hancock PPI:[id number] DOB: 06-18-1945 DOA: 04/11/2014  PCP: No primary care provider on file.  Admit date: 04/11/2014 Discharge date: 04/12/2014  Time spent: 40 minutes  Recommendations for Outpatient Follow-up:  1. Follow-up with primary care physician within one week. 2. Levofloxacin for total 7 days. 3. Patient requested to be discharged from the hospital, her husband and daughter at bedside and requested the same, I think patient can benefit from IV antibiotics but the refused and requested oral antibiotics at home.  Discharge Diagnoses:  Active Problems:   PNA (pneumonia)   Hyperlipemia   Breast cancer   Sepsis   Discharge Condition: Stable  Diet recommendation: Heart healthy  Filed Weights   04/11/14 1236 04/11/14 1800  Weight: 95.255 kg (210 lb) 95.255 kg (210 lb)    History of present illness:  Colleen Hancock is a 69 y.o. female, with past medical history of hyperlipidemia, breast cancer in remission, presents with complaints of shortness of breath, cough, weakness, fever and chills, patient is visiting from [place], reports she's been in [place] for the last 2 month, denies any sick contacts, no one sick at home, reports she took her to onset vaccine this year, reports she feels congested, but she can't produce any sputum, patient was febrile in ED 102.1, tachypneic, hypoxic on exertion, chest x-ray was significant for left upper lobe infiltrate, patient was started on IV Rocephin and azithromycin for community-acquired pneumonia after blood cultures were done, patient urinalysis significant for UTI.  Hospital Course:   Sepsis -Patient presents with fever, tachypnea, sepsis-most likely secondary to community-acquired pneumonia. -Follow on blood cultures, continue with gentle hydration. -Patient was still febrile earlier today, feels better today and requested to be discharged. -I asked the patient to stay in the hospital in  the presence of her husband and daughter at bedside, she refused. -Patient wants to be discharged home, pressing that she has a lot of help and can do antibiotics at home.  Community-acquired pneumonia -We'll start on Rocephin and azithromycin -When necessary nebulizer as needed, oxygen as needed, Mucinex, flutter valve. -Patient discharged on levofloxacin 750 mg for 7 days. Asked to take Mucinex and home.  UTI -Urinalysis showed questionable UTI, patient will be on levofloxacin anyway.  Hyperlipidemia -Continue with statin  History of breast cancer -Continue with tamoxifen  IBS -Continue Bentyl and cholestyramine per home dose. -Patient said she had diarrhea because she did not get her medications.  Procedures:  None  Consultations:  None  Discharge Exam: Filed Vitals:   04/12/14 0546  BP: 140/55  Pulse: 93  Temp: 102.9 F (39.4 C)  Resp: 18   General: Alert and awake, oriented x3, not in any acute distress. HEENT: anicteric sclera, pupils reactive to light and accommodation, EOMI CVS: S1-S2 clear, no murmur rubs or gallops Chest: clear to auscultation bilaterally, no wheezing, rales or rhonchi Abdomen: soft nontender, nondistended, normal bowel sounds, no organomegaly Extremities: no cyanosis, clubbing or edema noted bilaterally Neuro: Cranial nerves II-XII intact, no focal neurological deficits  Discharge Instructions   Discharge Instructions    Diet - low sodium heart healthy    Complete by:  As directed      Increase activity slowly    Complete by:  As directed           Current Discharge Medication List    START taking these medications   Details  levofloxacin (LEVAQUIN) 750 MG tablet Take 1 tablet (750 mg total) by mouth daily. Qty: 7  tablet, Refills: 0      CONTINUE these medications which have NOT CHANGED   Details  albuterol (PROVENTIL HFA;VENTOLIN HFA) 108 (90 BASE) MCG/ACT inhaler Inhale 1 puff into the lungs every 6 (six) hours as needed  for wheezing or shortness of breath.     aspirin 81 MG tablet Take 81 mg by mouth every other day.     atorvastatin (LIPITOR) 40 MG tablet Take 40 mg by mouth daily.      cholestyramine (QUESTRAN) 4 G packet Take 1 packet by mouth 1 day or 1 dose.      dicyclomine (BENTYL) 10 MG capsule Take 10 mg by mouth daily.     tamoxifen (NOLVADEX) 20 MG tablet Take 20 mg by mouth daily.       Allergies  Allergen Reactions  . Tetracyclines & Related Hives  . Pentazocine Lactate Nausea And Vomiting  . Clams [Shellfish Allergy] Diarrhea   Follow-up Information    Follow up with Velna Hatchet, MD In 1 week.   Specialty:  Internal Medicine   Contact information:   [place] [address] [phone]        The results of significant diagnostics from this hospitalization (including imaging, microbiology, ancillary and laboratory) are listed below for reference.    Significant Diagnostic Studies: Dg Chest 2 View  04/11/2014   CLINICAL DATA:  Two day history of cough  EXAM: CHEST  2 VIEW  COMPARISON:  None.  FINDINGS: There is patchy infiltrate in the anterior segment of the left upper lobe. There is mild left base scarring. Lungs elsewhere clear. Heart size and pulmonary vascularity are normal. No adenopathy. No bone lesions.  IMPRESSION: Infiltrate left upper lobe anterior segment. Mild scarring left base. Elsewhere lungs clear.  These results will be called to the ordering clinician or representative by the Radiologist Assistant, and communication documented in the PACS or zVision Dashboard.   Electronically Signed   By: Lowella Grip M.D.   On: 04/11/2014 11:28    Microbiology: Recent Results (from the past 240 hour(s))  Blood Culture (routine x 2)     Status: None (Preliminary result)   Collection Time: 04/11/14  1:50 PM  Result Value Ref Range Status   Specimen Description BLOOD LEFT ANTECUBITAL  Final   Special Requests BOTTLES DRAWN AEROBIC AND ANAEROBIC 5CC   Final   Culture   Final           BLOOD CULTURE RECEIVED NO GROWTH TO DATE CULTURE WILL BE HELD FOR 5 DAYS BEFORE ISSUING A FINAL NEGATIVE REPORT Performed at [facility]    Report Status PENDING  Incomplete  Blood Culture (routine x 2)     Status: None (Preliminary result)   Collection Time: 04/11/14  1:59 PM  Result Value Ref Range Status   Specimen Description BLOOD LEFT HAND  Final   Special Requests BOTTLES DRAWN AEROBIC AND ANAEROBIC 5CC  Final   Culture   Final           BLOOD CULTURE RECEIVED NO GROWTH TO DATE CULTURE WILL BE HELD FOR 5 DAYS BEFORE ISSUING A FINAL NEGATIVE REPORT Performed at [facility]    Report Status PENDING  Incomplete     Labs: Basic Metabolic Panel:  Recent Labs Lab 04/11/14 1305 04/11/14 1404 04/12/14 0515  NA 136 136 138  K 3.9 3.8 3.6  CL 102 101 109  CO2 26  --  20  GLUCOSE 136* 135* 146*  BUN 14 14  9  CREATININE 1.27* 1.20* 1.12*  CALCIUM 8.8  --  7.7*   Liver Function Tests:  Recent Labs Lab 04/11/14 1305  AST 36  ALT 30  ALKPHOS 53  BILITOT 0.5  PROT 6.8  ALBUMIN 3.5   No results for input(s): LIPASE, AMYLASE in the last 168 hours. No results for input(s): AMMONIA in the last 168 hours. CBC:  Recent Labs Lab 04/11/14 1305 04/11/14 1404 04/12/14 0515  WBC 7.7  --  6.0  NEUTROABS 6.4  --   --   HGB 12.4 13.3 11.2*  HCT 36.6 39.0 33.8*  MCV 94.3  --  95.5  PLT 150  --  133*   Cardiac Enzymes: No results for input(s): CKTOTAL, CKMB, CKMBINDEX, TROPONINI in the last 168 hours. BNP: BNP (last 3 results) No results for input(s): BNP in the last 8760 hours.  ProBNP (last 3 results) No results for input(s): PROBNP in the last 8760 hours.  CBG: No results for input(s): GLUCAP in the last 168 hours.     Signed:  Elainah Rhyne A  [facility] 04/12/2014, 10:12 AM

## 2014-04-12 NOTE — Progress Notes (Signed)
Patient discharge teaching given, including activity, diet, follow-up appoints, and medications. Patient verbalized understanding of all discharge instructions. IV access was d/c'd. Vitals are stable. Skin is intact except as charted in most recent assessments. Pt to be escorted out by NT, to be driven home by family.Patient discharge teaching given, including activity, diet, follow-up appoints, and medications. Patient verbalized understanding of all discharge instructions. IV access was d/c'd. Vitals are stable. Skin is intact except as charted in most recent assessments. Pt to be escorted out by NT, to be driven home by family.

## 2014-04-13 LAB — CULTURE, GROUP A STREP

## 2014-04-13 LAB — URINE CULTURE: Colony Count: >=100000

## 2014-04-17 LAB — CULTURE, BLOOD (ROUTINE X 2)
CULTURE: NO GROWTH
CULTURE: NO GROWTH

## 2014-04-18 DIAGNOSIS — E785 Hyperlipidemia, unspecified: Secondary | ICD-10-CM | POA: Diagnosis not present

## 2014-04-18 DIAGNOSIS — C50919 Malignant neoplasm of unspecified site of unspecified female breast: Secondary | ICD-10-CM | POA: Diagnosis not present

## 2014-04-18 DIAGNOSIS — J189 Pneumonia, unspecified organism: Secondary | ICD-10-CM | POA: Diagnosis not present

## 2014-04-18 DIAGNOSIS — Z6834 Body mass index (BMI) 34.0-34.9, adult: Secondary | ICD-10-CM | POA: Diagnosis not present

## 2014-04-18 DIAGNOSIS — K589 Irritable bowel syndrome without diarrhea: Secondary | ICD-10-CM | POA: Diagnosis not present

## 2014-04-18 DIAGNOSIS — K259 Gastric ulcer, unspecified as acute or chronic, without hemorrhage or perforation: Secondary | ICD-10-CM | POA: Diagnosis not present

## 2014-05-19 ENCOUNTER — Encounter (INDEPENDENT_AMBULATORY_CARE_PROVIDER_SITE_OTHER): Payer: Medicare Other | Admitting: Gastroenterology

## 2014-06-25 DIAGNOSIS — I10 Essential (primary) hypertension: Secondary | ICD-10-CM | POA: Diagnosis not present

## 2014-06-25 DIAGNOSIS — H8120 Vestibular neuronitis, unspecified ear: Secondary | ICD-10-CM | POA: Diagnosis not present

## 2014-06-25 DIAGNOSIS — M150 Primary generalized (osteo)arthritis: Secondary | ICD-10-CM | POA: Diagnosis not present

## 2014-06-25 DIAGNOSIS — Z6837 Body mass index (BMI) 37.0-37.9, adult: Secondary | ICD-10-CM | POA: Diagnosis not present

## 2014-06-25 DIAGNOSIS — E780 Pure hypercholesterolemia: Secondary | ICD-10-CM | POA: Diagnosis not present

## 2014-06-25 DIAGNOSIS — E785 Hyperlipidemia, unspecified: Secondary | ICD-10-CM | POA: Diagnosis not present

## 2014-06-25 DIAGNOSIS — K580 Irritable bowel syndrome with diarrhea: Secondary | ICD-10-CM | POA: Diagnosis not present

## 2014-06-25 DIAGNOSIS — R5383 Other fatigue: Secondary | ICD-10-CM | POA: Diagnosis not present

## 2014-06-25 DIAGNOSIS — Z79899 Other long term (current) drug therapy: Secondary | ICD-10-CM | POA: Diagnosis not present

## 2014-06-26 DIAGNOSIS — H8112 Benign paroxysmal vertigo, left ear: Secondary | ICD-10-CM | POA: Diagnosis not present

## 2014-06-26 DIAGNOSIS — Z6835 Body mass index (BMI) 35.0-35.9, adult: Secondary | ICD-10-CM | POA: Diagnosis not present

## 2014-07-02 DIAGNOSIS — C50919 Malignant neoplasm of unspecified site of unspecified female breast: Secondary | ICD-10-CM | POA: Diagnosis not present

## 2014-07-03 DIAGNOSIS — Z853 Personal history of malignant neoplasm of breast: Secondary | ICD-10-CM | POA: Diagnosis not present

## 2014-07-03 DIAGNOSIS — Z08 Encounter for follow-up examination after completed treatment for malignant neoplasm: Secondary | ICD-10-CM | POA: Diagnosis not present

## 2014-07-14 DIAGNOSIS — H8112 Benign paroxysmal vertigo, left ear: Secondary | ICD-10-CM | POA: Diagnosis not present

## 2014-07-14 DIAGNOSIS — Z6835 Body mass index (BMI) 35.0-35.9, adult: Secondary | ICD-10-CM | POA: Diagnosis not present

## 2014-07-17 DIAGNOSIS — Z01419 Encounter for gynecological examination (general) (routine) without abnormal findings: Secondary | ICD-10-CM | POA: Diagnosis not present

## 2014-07-17 DIAGNOSIS — C50912 Malignant neoplasm of unspecified site of left female breast: Secondary | ICD-10-CM | POA: Diagnosis not present

## 2014-07-17 DIAGNOSIS — Z6837 Body mass index (BMI) 37.0-37.9, adult: Secondary | ICD-10-CM | POA: Diagnosis not present

## 2014-07-17 DIAGNOSIS — Z853 Personal history of malignant neoplasm of breast: Secondary | ICD-10-CM | POA: Diagnosis not present

## 2014-07-17 DIAGNOSIS — M8580 Other specified disorders of bone density and structure, unspecified site: Secondary | ICD-10-CM | POA: Diagnosis not present

## 2014-07-17 DIAGNOSIS — M899 Disorder of bone, unspecified: Secondary | ICD-10-CM | POA: Diagnosis not present

## 2014-07-17 DIAGNOSIS — Z1211 Encounter for screening for malignant neoplasm of colon: Secondary | ICD-10-CM | POA: Diagnosis not present

## 2014-07-17 DIAGNOSIS — R928 Other abnormal and inconclusive findings on diagnostic imaging of breast: Secondary | ICD-10-CM | POA: Diagnosis not present

## 2014-07-17 DIAGNOSIS — N6489 Other specified disorders of breast: Secondary | ICD-10-CM | POA: Diagnosis not present

## 2014-07-17 DIAGNOSIS — Z124 Encounter for screening for malignant neoplasm of cervix: Secondary | ICD-10-CM | POA: Diagnosis not present

## 2014-07-22 DIAGNOSIS — Z6836 Body mass index (BMI) 36.0-36.9, adult: Secondary | ICD-10-CM | POA: Diagnosis not present

## 2014-07-22 DIAGNOSIS — C50919 Malignant neoplasm of unspecified site of unspecified female breast: Secondary | ICD-10-CM | POA: Diagnosis not present

## 2014-10-09 ENCOUNTER — Encounter (INDEPENDENT_AMBULATORY_CARE_PROVIDER_SITE_OTHER): Payer: Self-pay | Admitting: Gastroenterology

## 2014-10-09 ENCOUNTER — Ambulatory Visit: Payer: Medicare Other | Attending: Gastroenterology | Admitting: Gastroenterology

## 2014-10-09 VITALS — BP 153/80 | HR 88 | Temp 98.5°F | Ht 63.0 in | Wt 213.8 lb

## 2014-10-09 DIAGNOSIS — K580 Irritable bowel syndrome with diarrhea: Secondary | ICD-10-CM | POA: Insufficient documentation

## 2014-10-09 DIAGNOSIS — K589 Irritable bowel syndrome without diarrhea: Secondary | ICD-10-CM

## 2014-10-09 DIAGNOSIS — R197 Diarrhea, unspecified: Secondary | ICD-10-CM

## 2014-10-09 DIAGNOSIS — Z9049 Acquired absence of other specified parts of digestive tract: Secondary | ICD-10-CM | POA: Insufficient documentation

## 2014-10-09 DIAGNOSIS — K5790 Diverticulosis of intestine, part unspecified, without perforation or abscess without bleeding: Secondary | ICD-10-CM | POA: Insufficient documentation

## 2014-10-09 DIAGNOSIS — Z7982 Long term (current) use of aspirin: Secondary | ICD-10-CM | POA: Insufficient documentation

## 2014-10-09 DIAGNOSIS — E780 Pure hypercholesterolemia: Secondary | ICD-10-CM | POA: Insufficient documentation

## 2014-10-09 DIAGNOSIS — IMO0001 Reserved for inherently not codable concepts without codable children: Secondary | ICD-10-CM

## 2014-10-09 DIAGNOSIS — R143 Flatulence: Secondary | ICD-10-CM | POA: Insufficient documentation

## 2014-10-09 NOTE — Progress Notes (Signed)
Requesting Physician: Maud Deed,*  History of Present Illness  Christine Gilmore is a 69 y.o. female who presents with a chief complaint of Irritable Bowel Syndrome and Diarrhea   to clinic.  Has been dealing with diarrhea-predominant IBS for many years (since 36s).  Was seen by me at [facility] >3 years ago.  Has been taking dicyclomine and cholestyramine for many years.  Takes 3/4 scoop of cholestyramine daily. +flatus. Has tried more but causes constipation.  Taking dicyclomine once daily. Had colonoscopy 3 years ago that was normal. Has had 10 over the years.  BMs are 5-10 times daily. Loose, watery, runny. Last normal BM was 10 years ago.  Had a chole years ago. Tried hyoscyamine in the past.  Peas, spinach, other foods don't digest. No rectal bleeding, no weight loss.  Has not tried imodium in the past.    I have previously seen her at my old office, more than 3 years ago, similar complaints today as in the past as well as 20 years ago. Continues to have diarrhea/loose bowel movements on a daily basis. Sometimes associated with abdominal cramping. Denies any unintentional weight loss or rectal bleeding. Has been treated with dicyclomine and cholestyramine for many years. Feels that this medication is helping but is not completely resolving her symptoms.    Past History  Current Outpatient Prescriptions   Medication Sig    albuterol sulfate (PROVENTIL OR VENTOLIN OR PROAIR) 90 mcg/actuation Inhalation HFA Aerosol Inhaler Take 1-2 Puffs by inhalation Every 6 hours as needed    aspirin (ECOTRIN) 81 mg Oral Tablet, Delayed Release (E.C.) Take 81 mg by mouth Once a day    atorvastatin (LIPITOR) 20 mg Oral Tablet Take 20 mg by mouth Every evening    CALCIUM CARBONATE/VITAMIN D3 (CALTRATE 600 + D ORAL) Take by mouth    cholestyramine-aspartame (PREVALITE) 4 gram Oral Powder in Packet Take 4 g by mouth Every evening with dinner    dicyclomine (BENTYL) 10 mg Oral Capsule Take 10 mg by mouth Four times  a day    FOLIC ACID/MULTIVIT-MIN/LUTEIN (CENTRUM SILVER ORAL) Take by mouth    OMEGA-3S/DHA/EPA/FISH OIL (OMEGA 3 ORAL) Take by mouth    Tamoxifen (NOLVADEX) 20 mg Oral Tablet Take 20 mg by mouth Once a day     Allergies   Allergen Reactions    Other      Sea food    [place] [Pentazocine Lactate] Nausea/ Vomiting    Tetracycline Hives/ Urticaria     Past Medical History   Diagnosis Date    Hypercholesterolemia      Past Surgical History   Procedure Laterality Date    Hx gall bladder surgery/chole      Hx cesarean section       Family History  Family History   Problem Relation Age of Onset    Alzheimer's/Dementia Mother     Coronary Artery Disease Father      Social History  History     Social History    Marital Status: Married     Spouse Name: N/A    Number of Children: N/A    Years of Education: N/A     Social History Main Topics    Smoking status: Not on file    Smokeless tobacco: Not on file    Alcohol Use: Not on file    Drug Use: Not on file    Sexual Activity: Not on file     Other Topics Concern    Not  on file     Social History Narrative     Review of Systems      Constitutional: negative for fevers, chills and sweats   Eyes: negative for visual disturbance and icterus   Ears, nose, mouth, throat, and face: negative for hearing loss and tinnitus   Respiratory: negative for cough; +dyspnea on exertion   Cardiovascular: negative for chest pain and palpitations   Gastrointestinal: See HPI   Genitourinary:negative for urinary incontinence and hematuria   Integument/breast: negative for rash and skin lesion(s)   Hematologic/lymphatic: negative for easy bruising and bleeding   Musculoskeletal: negative for myalgias and arthralgias   Neurological: negative for headaches and dizziness   Behavioral/Psych: negative for anxiety and depression   Allergic/Immunologic: negative for urticaria      Examination  Vitals: BP 153/80 mmHg   Pulse 88   Temp(Src) 36.9 C (98.5 F) (Tympanic)   Ht 1.6 m (5\' 3" )    Wt 97 kg (213 lb 13.5 oz)   BMI 37.89 kg/m2   SpO2 93%  General: appears in good health and appears stated age  Eyes: Pupils equal and round, anicteric sclera.   HENT:normocephalic, mucous membranes moist.   Lungs: clear to auscultation bilaterally.   Cardiovascular:    Heart regular rate and rhythm without murmer  Abdomen: soft, non-tender, bowel sounds normal, non-distended   Extremities: no cyanosis or edema  Skin: Skin warm and dry, no obvious lesions  Neurologic: grossly normal  Lymphatics: no lower extremity edema  Psychiatric: Alert and Oriented x3, affect normal    Impression  1. Irritable bowel syndrome    2. Diarrhea    3. Gas    4. Diverticulosis      Very pleasant 69 year old female with long-standing irritable bowel syndrome, diarrhea predominant, likely compounded by her post cholecystectomy status. No alarm features such as unintentional weight loss or rectal bleeding.    Recommendations:  No orders of the defined types were placed in this encounter.     We had a discussion about continuation of the medication she is currently taking. Anticholinergics have been deemed somewhat concerning in patients older than 70 years old however since she has no ill effects from the medication and seems to be getting some relief from the medication, it would be in her best interest to continue medication once daily. I do not feel that increasing the dosage would be of any benefit.    She will continue the cholestyramine. I feel that she can alter the dosage of this medication as needed so as not to worsen her symptoms or to cause constipation.    I feel that ideally she would benefit from the use of soluble fiber. I recommended several supplements over-the-counter that might be beneficial to her.    In addition, judicious use of Imodium might be beneficial, she may end up with some constipation if she is not careful.    Currently no indication to repeat an EGD or colonoscopy-she has had multiple colonoscopies in the  past without any abnormal findings.    Thank you for the referral, followup as needed    Idalia Needle, MD, [facility]   10/09/2014, 13:38  Assistant Professor of Medicine  Section of Digestive Diseases  [facility] / [place]

## 2014-10-13 DIAGNOSIS — Z6836 Body mass index (BMI) 36.0-36.9, adult: Secondary | ICD-10-CM | POA: Diagnosis not present

## 2014-10-13 DIAGNOSIS — K580 Irritable bowel syndrome with diarrhea: Secondary | ICD-10-CM | POA: Diagnosis not present

## 2014-10-13 DIAGNOSIS — J309 Allergic rhinitis, unspecified: Secondary | ICD-10-CM | POA: Diagnosis not present

## 2014-10-13 DIAGNOSIS — E780 Pure hypercholesterolemia: Secondary | ICD-10-CM | POA: Diagnosis not present

## 2014-10-13 DIAGNOSIS — Z79899 Other long term (current) drug therapy: Secondary | ICD-10-CM | POA: Diagnosis not present

## 2014-10-13 DIAGNOSIS — J4520 Mild intermittent asthma, uncomplicated: Secondary | ICD-10-CM | POA: Diagnosis not present

## 2014-10-13 DIAGNOSIS — R350 Frequency of micturition: Secondary | ICD-10-CM | POA: Diagnosis not present

## 2014-10-13 DIAGNOSIS — I2510 Atherosclerotic heart disease of native coronary artery without angina pectoris: Secondary | ICD-10-CM | POA: Diagnosis not present

## 2014-10-13 DIAGNOSIS — M722 Plantar fascial fibromatosis: Secondary | ICD-10-CM | POA: Diagnosis not present

## 2014-10-15 DIAGNOSIS — M79672 Pain in left foot: Secondary | ICD-10-CM | POA: Diagnosis not present

## 2014-10-15 DIAGNOSIS — M722 Plantar fascial fibromatosis: Secondary | ICD-10-CM | POA: Diagnosis not present

## 2014-11-03 DIAGNOSIS — Z6836 Body mass index (BMI) 36.0-36.9, adult: Secondary | ICD-10-CM | POA: Diagnosis not present

## 2014-11-03 DIAGNOSIS — L299 Pruritus, unspecified: Secondary | ICD-10-CM | POA: Diagnosis not present

## 2014-11-04 DIAGNOSIS — I658 Occlusion and stenosis of other precerebral arteries: Secondary | ICD-10-CM | POA: Diagnosis not present

## 2014-11-06 DIAGNOSIS — M722 Plantar fascial fibromatosis: Secondary | ICD-10-CM | POA: Diagnosis not present

## 2014-11-06 DIAGNOSIS — M79672 Pain in left foot: Secondary | ICD-10-CM | POA: Diagnosis not present

## 2014-11-25 DIAGNOSIS — M79672 Pain in left foot: Secondary | ICD-10-CM | POA: Diagnosis not present

## 2014-11-25 DIAGNOSIS — M722 Plantar fascial fibromatosis: Secondary | ICD-10-CM | POA: Diagnosis not present

## 2014-12-18 DIAGNOSIS — M79672 Pain in left foot: Secondary | ICD-10-CM | POA: Diagnosis not present

## 2014-12-18 DIAGNOSIS — M722 Plantar fascial fibromatosis: Secondary | ICD-10-CM | POA: Diagnosis not present

## 2015-01-13 DIAGNOSIS — D0590 Unspecified type of carcinoma in situ of unspecified breast: Secondary | ICD-10-CM | POA: Diagnosis not present

## 2015-01-15 DIAGNOSIS — H2513 Age-related nuclear cataract, bilateral: Secondary | ICD-10-CM | POA: Diagnosis not present

## 2015-01-15 DIAGNOSIS — Z79899 Other long term (current) drug therapy: Secondary | ICD-10-CM | POA: Diagnosis not present

## 2015-01-15 DIAGNOSIS — Z23 Encounter for immunization: Secondary | ICD-10-CM | POA: Diagnosis not present

## 2015-01-15 DIAGNOSIS — I10 Essential (primary) hypertension: Secondary | ICD-10-CM | POA: Diagnosis not present

## 2015-01-15 DIAGNOSIS — E7800 Pure hypercholesterolemia, unspecified: Secondary | ICD-10-CM | POA: Diagnosis not present

## 2015-01-15 DIAGNOSIS — R350 Frequency of micturition: Secondary | ICD-10-CM | POA: Diagnosis not present

## 2015-01-15 DIAGNOSIS — H16223 Keratoconjunctivitis sicca, not specified as Sjogren's, bilateral: Secondary | ICD-10-CM | POA: Diagnosis not present

## 2015-01-15 DIAGNOSIS — I2510 Atherosclerotic heart disease of native coronary artery without angina pectoris: Secondary | ICD-10-CM | POA: Diagnosis not present

## 2015-01-20 DIAGNOSIS — Z6837 Body mass index (BMI) 37.0-37.9, adult: Secondary | ICD-10-CM | POA: Diagnosis not present

## 2015-01-20 DIAGNOSIS — J4520 Mild intermittent asthma, uncomplicated: Secondary | ICD-10-CM | POA: Diagnosis not present

## 2015-01-20 DIAGNOSIS — J309 Allergic rhinitis, unspecified: Secondary | ICD-10-CM | POA: Diagnosis not present

## 2015-01-20 DIAGNOSIS — M722 Plantar fascial fibromatosis: Secondary | ICD-10-CM | POA: Diagnosis not present

## 2015-01-20 DIAGNOSIS — K580 Irritable bowel syndrome with diarrhea: Secondary | ICD-10-CM | POA: Diagnosis not present

## 2015-01-20 DIAGNOSIS — E7800 Pure hypercholesterolemia, unspecified: Secondary | ICD-10-CM | POA: Diagnosis not present

## 2015-02-24 DIAGNOSIS — M722 Plantar fascial fibromatosis: Secondary | ICD-10-CM | POA: Diagnosis not present

## 2015-02-24 DIAGNOSIS — M79672 Pain in left foot: Secondary | ICD-10-CM | POA: Diagnosis not present

## 2015-03-10 DIAGNOSIS — M722 Plantar fascial fibromatosis: Secondary | ICD-10-CM | POA: Diagnosis not present

## 2015-03-24 DIAGNOSIS — M722 Plantar fascial fibromatosis: Secondary | ICD-10-CM | POA: Diagnosis not present

## 2015-04-01 DIAGNOSIS — M722 Plantar fascial fibromatosis: Secondary | ICD-10-CM | POA: Diagnosis not present

## 2015-04-07 DIAGNOSIS — M79672 Pain in left foot: Secondary | ICD-10-CM | POA: Diagnosis not present

## 2015-04-15 DIAGNOSIS — M722 Plantar fascial fibromatosis: Secondary | ICD-10-CM | POA: Diagnosis not present

## 2015-05-12 DIAGNOSIS — M722 Plantar fascial fibromatosis: Secondary | ICD-10-CM | POA: Diagnosis not present

## 2015-06-29 DIAGNOSIS — C50919 Malignant neoplasm of unspecified site of unspecified female breast: Secondary | ICD-10-CM | POA: Diagnosis not present

## 2015-07-06 DIAGNOSIS — Z6836 Body mass index (BMI) 36.0-36.9, adult: Secondary | ICD-10-CM | POA: Diagnosis not present

## 2015-07-06 DIAGNOSIS — R5383 Other fatigue: Secondary | ICD-10-CM | POA: Diagnosis not present

## 2015-07-06 DIAGNOSIS — K580 Irritable bowel syndrome with diarrhea: Secondary | ICD-10-CM | POA: Diagnosis not present

## 2015-07-06 DIAGNOSIS — J309 Allergic rhinitis, unspecified: Secondary | ICD-10-CM | POA: Diagnosis not present

## 2015-07-06 DIAGNOSIS — I2510 Atherosclerotic heart disease of native coronary artery without angina pectoris: Secondary | ICD-10-CM | POA: Diagnosis not present

## 2015-07-06 DIAGNOSIS — M150 Primary generalized (osteo)arthritis: Secondary | ICD-10-CM | POA: Diagnosis not present

## 2015-07-06 DIAGNOSIS — E7800 Pure hypercholesterolemia, unspecified: Secondary | ICD-10-CM | POA: Diagnosis not present

## 2015-07-06 DIAGNOSIS — I10 Essential (primary) hypertension: Secondary | ICD-10-CM | POA: Diagnosis not present

## 2015-07-06 DIAGNOSIS — E559 Vitamin D deficiency, unspecified: Secondary | ICD-10-CM | POA: Diagnosis not present

## 2015-07-06 DIAGNOSIS — Z79899 Other long term (current) drug therapy: Secondary | ICD-10-CM | POA: Diagnosis not present

## 2015-07-06 DIAGNOSIS — J4520 Mild intermittent asthma, uncomplicated: Secondary | ICD-10-CM | POA: Diagnosis not present

## 2015-07-15 DIAGNOSIS — Z79810 Long term (current) use of selective estrogen receptor modulators (SERMs): Secondary | ICD-10-CM | POA: Diagnosis not present

## 2015-07-15 DIAGNOSIS — Z853 Personal history of malignant neoplasm of breast: Secondary | ICD-10-CM | POA: Diagnosis not present

## 2015-07-15 DIAGNOSIS — Z923 Personal history of irradiation: Secondary | ICD-10-CM | POA: Diagnosis not present

## 2015-07-15 DIAGNOSIS — N644 Mastodynia: Secondary | ICD-10-CM | POA: Diagnosis not present

## 2015-07-15 DIAGNOSIS — E785 Hyperlipidemia, unspecified: Secondary | ICD-10-CM | POA: Diagnosis not present

## 2015-07-24 DIAGNOSIS — Z1231 Encounter for screening mammogram for malignant neoplasm of breast: Secondary | ICD-10-CM | POA: Diagnosis not present

## 2015-07-29 DIAGNOSIS — Z853 Personal history of malignant neoplasm of breast: Secondary | ICD-10-CM | POA: Diagnosis not present

## 2015-09-25 DIAGNOSIS — M79661 Pain in right lower leg: Secondary | ICD-10-CM | POA: Diagnosis not present

## 2015-09-25 DIAGNOSIS — M7989 Other specified soft tissue disorders: Secondary | ICD-10-CM | POA: Diagnosis not present

## 2015-09-26 DIAGNOSIS — S86912A Strain of unspecified muscle(s) and tendon(s) at lower leg level, left leg, initial encounter: Secondary | ICD-10-CM | POA: Diagnosis not present

## 2015-09-26 DIAGNOSIS — X501XXA Overexertion from prolonged static or awkward postures, initial encounter: Secondary | ICD-10-CM | POA: Diagnosis not present

## 2015-09-26 DIAGNOSIS — M79661 Pain in right lower leg: Secondary | ICD-10-CM | POA: Diagnosis not present

## 2015-09-26 DIAGNOSIS — K589 Irritable bowel syndrome without diarrhea: Secondary | ICD-10-CM | POA: Diagnosis not present

## 2015-09-26 DIAGNOSIS — S86811A Strain of other muscle(s) and tendon(s) at lower leg level, right leg, initial encounter: Secondary | ICD-10-CM | POA: Diagnosis not present

## 2015-09-26 DIAGNOSIS — E785 Hyperlipidemia, unspecified: Secondary | ICD-10-CM | POA: Diagnosis not present

## 2015-10-02 DIAGNOSIS — M79661 Pain in right lower leg: Secondary | ICD-10-CM | POA: Diagnosis not present

## 2015-10-02 DIAGNOSIS — S86011A Strain of right Achilles tendon, initial encounter: Secondary | ICD-10-CM | POA: Diagnosis not present

## 2015-10-06 DIAGNOSIS — Z1211 Encounter for screening for malignant neoplasm of colon: Secondary | ICD-10-CM | POA: Diagnosis not present

## 2015-10-06 DIAGNOSIS — Z01419 Encounter for gynecological examination (general) (routine) without abnormal findings: Secondary | ICD-10-CM | POA: Diagnosis not present

## 2015-11-27 DIAGNOSIS — R0609 Other forms of dyspnea: Secondary | ICD-10-CM | POA: Diagnosis not present

## 2015-11-27 DIAGNOSIS — Z23 Encounter for immunization: Secondary | ICD-10-CM | POA: Diagnosis not present

## 2015-11-27 DIAGNOSIS — Z6831 Body mass index (BMI) 31.0-31.9, adult: Secondary | ICD-10-CM | POA: Diagnosis not present

## 2015-11-27 DIAGNOSIS — C50919 Malignant neoplasm of unspecified site of unspecified female breast: Secondary | ICD-10-CM | POA: Diagnosis not present

## 2015-12-09 ENCOUNTER — Encounter: Payer: Self-pay | Admitting: Hematology and Oncology

## 2015-12-15 ENCOUNTER — Other Ambulatory Visit: Payer: Self-pay | Admitting: Internal Medicine

## 2015-12-15 DIAGNOSIS — R0600 Dyspnea, unspecified: Secondary | ICD-10-CM

## 2015-12-15 DIAGNOSIS — E785 Hyperlipidemia, unspecified: Secondary | ICD-10-CM

## 2015-12-15 DIAGNOSIS — R0609 Other forms of dyspnea: Secondary | ICD-10-CM

## 2015-12-17 ENCOUNTER — Telehealth (HOSPITAL_COMMUNITY): Payer: Self-pay

## 2015-12-17 NOTE — Telephone Encounter (Signed)
Encounter complete. 

## 2015-12-18 ENCOUNTER — Ambulatory Visit (HOSPITAL_COMMUNITY)
Admission: RE | Admit: 2015-12-18 | Discharge: 2015-12-18 | Disposition: A | Discharge status: Home / Self Care | Payer: Medicare Other | Source: Ambulatory Visit | Attending: Cardiology | Admitting: Cardiology

## 2015-12-18 DIAGNOSIS — E785 Hyperlipidemia, unspecified: Secondary | ICD-10-CM | POA: Insufficient documentation

## 2015-12-18 DIAGNOSIS — R0609 Other forms of dyspnea: Secondary | ICD-10-CM | POA: Diagnosis not present

## 2015-12-18 DIAGNOSIS — R0600 Dyspnea, unspecified: Secondary | ICD-10-CM

## 2015-12-18 LAB — EXERCISE TOLERANCE TEST
CHL CUP RESTING HR STRESS: 86 {beats}/min
CHL RATE OF PERCEIVED EXERTION: 17
CSEPEW: 5.8 METS
CSEPPHR: 141 {beats}/min
Exercise duration (min): 4 min
Exercise duration (sec): 0 s
MPHR: 150 {beats}/min
Percent HR: 94 %

## 2016-01-05 ENCOUNTER — Ambulatory Visit: Payer: Medicare Other | Admitting: Hematology and Oncology

## 2016-01-05 ENCOUNTER — Telehealth: Payer: Self-pay | Admitting: Hematology and Oncology

## 2016-01-05 ENCOUNTER — Other Ambulatory Visit: Payer: Medicare Other

## 2016-01-05 NOTE — Telephone Encounter (Signed)
Returned the pt's vm to reschedule appt. Reschedule due to tooth extraction. Appt scheduled w/Gudena on 11/21 at 345pm. Pt agreed.

## 2016-01-26 ENCOUNTER — Encounter: Payer: Self-pay | Admitting: Hematology and Oncology

## 2016-01-26 ENCOUNTER — Ambulatory Visit (HOSPITAL_BASED_OUTPATIENT_CLINIC_OR_DEPARTMENT_OTHER): Payer: Medicare Other | Admitting: Hematology and Oncology

## 2016-01-26 VITALS — BP 154/52 | HR 84 | Temp 98.5°F | Resp 18 | Wt 160.1 lb

## 2016-01-26 DIAGNOSIS — D0512 Intraductal carcinoma in situ of left breast: Secondary | ICD-10-CM | POA: Diagnosis not present

## 2016-01-26 DIAGNOSIS — Z171 Estrogen receptor negative status [ER-]: Secondary | ICD-10-CM | POA: Diagnosis not present

## 2016-01-26 DIAGNOSIS — J45909 Unspecified asthma, uncomplicated: Secondary | ICD-10-CM | POA: Insufficient documentation

## 2016-01-26 MED ORDER — TAMOXIFEN CITRATE 20 MG PO TABS: 20.0000 mg | ORAL_TABLET | Freq: Every day | ORAL | 3 refills | Status: DC

## 2016-01-26 NOTE — Progress Notes (Signed)
[place] NOTE  Patient Care Team: Velna Hatchet, MD as PCP - General (Internal Medicine)  CHIEF COMPLAINTS/PURPOSE OF CONSULTATION:  Left breast DCIS transfer of care  HISTORY OF PRESENTING ILLNESS:  Colleen Hancock 70 y.o. 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 She had a mammogram that showed an abnormality which on biopsy came back as DCIS. She underwent lumpectomy followed by adjuvant radiation. 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 Apparently lymph node was also sampled and it was negative. HER-2 was also done it was negative. She started tamoxifen therapy in September 2014 and has been on tamoxifen ever since. It appears that she had been tolerating tamoxifen fairly well. This was all done in [place]. She moved to [facility] to be closer to her daughter because her husband had a gastric bypass surgery and is currently undergoing rehabilitation and wanted to stay close to her daughter's in [place]. She reports that tamoxifen is being well tolerated   I reviewed her records extensively and collaborated the history with the patient.  MEDICAL HISTORY:  Past Medical History:  Diagnosis Date  . Breast CA   . High cholesterol     SURGICAL HISTORY: Past Surgical History:  Procedure Laterality Date  . CHOLECYSTECTOMY      SOCIAL HISTORY: Social History   Social History  . Marital status: Married    Spouse name: N/A  . Number of children: N/A  . Years of education: N/A   Occupational History  . Not on file.   Social History Main Topics  . Smoking status: Never Smoker  . Smokeless tobacco: Never Used  . Alcohol use Yes     Comment: Occasionally  . Drug use: No  . Sexual activity: Not on file   Other Topics Concern  . Not on file   Social History Narrative  . No narrative on file    FAMILY HISTORY: Family History  Problem Relation Age of Onset  . Heart  disease Other     ALLERGIES:  is allergic to tetracyclines & related; pentazocine lactate; clams [shellfish allergy]; and fentanyl.  MEDICATIONS:  Current Outpatient Prescriptions  Medication Sig Dispense Refill  . albuterol (PROVENTIL HFA;VENTOLIN HFA) 108 (90 BASE) MCG/ACT inhaler Inhale 1 puff into the lungs every 6 (six) hours as needed for wheezing or shortness of breath.     Marland Kitchen aspirin 81 MG tablet Take 81 mg by mouth every other day.     Marland Kitchen atorvastatin (LIPITOR) 40 MG tablet Take 40 mg by mouth daily.      . cholestyramine (QUESTRAN) 4 G packet Take 1 packet by mouth 1 day or 1 dose.      . dicyclomine (BENTYL) 10 MG capsule Take 10 mg by mouth daily.     Marland Kitchen levofloxacin (LEVAQUIN) 750 MG tablet Take 1 tablet (750 mg total) by mouth daily. 7 tablet 0  . tamoxifen (NOLVADEX) 20 MG tablet Take 1 tablet (20 mg total) by mouth daily. 90 tablet 3   No current facility-administered medications for this visit.     REVIEW OF SYSTEMS:   Constitutional: Denies fevers, chills or abnormal night sweats Eyes: Denies blurriness of vision, double vision or watery eyes Ears, nose, mouth, throat, and face: Denies mucositis or sore throat Respiratory: Denies cough, dyspnea or wheezes Cardiovascular: Denies palpitation, chest discomfort or lower extremity swelling Gastrointestinal:  Denies nausea, heartburn or change in bowel habits Skin: Denies abnormal skin rashes  Lymphatics: Denies new lymphadenopathy or easy bruising Neurological:Denies numbness, tingling or new weaknesses Behavioral/Psych: Mood is stable, no new changes  Breast:  Denies any palpable lumps or discharge All other systems were reviewed with the patient and are negative.  PHYSICAL EXAMINATION: ECOG PERFORMANCE STATUS: 0 - Asymptomatic  Vitals:   01/26/16 1522  BP: (!) 154/52  Pulse: 84  Resp: 18  Temp: 98.5 F (36.9 C)   Filed Weights   01/26/16 1522  Weight: 160 lb 1.6 oz (72.6 kg)    GENERAL:alert, no distress  and comfortable SKIN: skin color, texture, turgor are normal, no rashes or significant lesions EYES: normal, conjunctiva are pink and non-injected, sclera clear OROPHARYNX:no exudate, no erythema and lips, buccal mucosa, and tongue normal  NECK: supple, thyroid normal size, non-tender, without nodularity LYMPH:  no palpable lymphadenopathy in the cervical, axillary or inguinal LUNGS: clear to auscultation and percussion with normal breathing effort HEART: regular rate & rhythm and no murmurs and no lower extremity edema ABDOMEN:abdomen soft, non-tender and normal bowel sounds Musculoskeletal:no cyanosis of digits and no clubbing  PSYCH: alert & oriented x 3 with fluent speech NEURO: no focal motor/sensory deficits BREAST: No palpable nodules in breast. No palpable axillary or supraclavicular lymphadenopathy (exam performed in the presence of a chaperone)   LABORATORY DATA:  I have reviewed the data as listed Lab Results  Component Value Date   WBC 6.0 04/12/2014   HGB 11.2 (L) 04/12/2014   HCT 33.8 (L) 04/12/2014   MCV 95.5 04/12/2014   PLT 133 (L) 04/12/2014   Lab Results  Component Value Date   NA 138 04/12/2014   K 3.6 04/12/2014   CL 109 04/12/2014   CO2 20 04/12/2014    RADIOGRAPHIC STUDIES: I have personally reviewed the radiological reports and agreed with the findings in the report.  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 Tamoxifen toxicities: No major side effects to tamoxifen. Breast cancer surveillance: 1. Mammograms done May 2017: normal 2. breast exam November 2017: Benign  I discussed with her extensively about what is DCIS and the significance of ER and PR receptors. I anticipate that she would have very low benefit from tamoxifen therapy since her DCIS was ER/PR negative. However it is reasonable to continue with this treatment for the total  duration of 5 years.  I sent a prescription for tamoxifen to her pharmacy  Return to clinic in 1 year for follow-up.  All questions were answered. The patient knows to call the clinic with any problems, questions or concerns.    Rulon Eisenmenger, MD 01/26/16

## 2016-01-31 DIAGNOSIS — H2513 Age-related nuclear cataract, bilateral: Secondary | ICD-10-CM | POA: Diagnosis not present

## 2016-03-07 HISTORY — PX: MASTECTOMY: SHX3

## 2016-04-25 ENCOUNTER — Other Ambulatory Visit: Payer: Self-pay | Admitting: *Deleted

## 2016-04-25 MED ORDER — TAMOXIFEN CITRATE 20 MG PO TABS: 20.0000 mg | ORAL_TABLET | Freq: Every day | ORAL | 3 refills | Status: DC

## 2016-07-05 ENCOUNTER — Ambulatory Visit
Admission: RE | Admit: 2016-07-05 | Discharge: 2016-07-05 | Disposition: A | Discharge status: Home / Self Care | Payer: Medicare Other | Source: Ambulatory Visit | Attending: Hematology and Oncology | Admitting: Hematology and Oncology

## 2016-07-05 DIAGNOSIS — D0512 Intraductal carcinoma in situ of left breast: Secondary | ICD-10-CM

## 2016-07-06 ENCOUNTER — Ambulatory Visit: Payer: Medicare Other | Admitting: Hematology and Oncology

## 2016-07-11 DIAGNOSIS — M545 Low back pain: Secondary | ICD-10-CM | POA: Diagnosis not present

## 2016-07-11 DIAGNOSIS — E784 Other hyperlipidemia: Secondary | ICD-10-CM | POA: Diagnosis not present

## 2016-07-11 DIAGNOSIS — Z0000 Encounter for general adult medical examination without abnormal findings: Secondary | ICD-10-CM | POA: Diagnosis not present

## 2016-07-18 DIAGNOSIS — C50919 Malignant neoplasm of unspecified site of unspecified female breast: Secondary | ICD-10-CM | POA: Diagnosis not present

## 2016-07-18 DIAGNOSIS — Z6833 Body mass index (BMI) 33.0-33.9, adult: Secondary | ICD-10-CM | POA: Diagnosis not present

## 2016-07-18 DIAGNOSIS — Z0000 Encounter for general adult medical examination without abnormal findings: Secondary | ICD-10-CM | POA: Diagnosis not present

## 2016-07-18 DIAGNOSIS — R5383 Other fatigue: Secondary | ICD-10-CM | POA: Diagnosis not present

## 2016-07-18 DIAGNOSIS — M545 Low back pain: Secondary | ICD-10-CM | POA: Diagnosis not present

## 2016-07-18 DIAGNOSIS — K589 Irritable bowel syndrome without diarrhea: Secondary | ICD-10-CM | POA: Diagnosis not present

## 2016-07-18 DIAGNOSIS — E784 Other hyperlipidemia: Secondary | ICD-10-CM | POA: Diagnosis not present

## 2016-07-18 DIAGNOSIS — M859 Disorder of bone density and structure, unspecified: Secondary | ICD-10-CM | POA: Diagnosis not present

## 2016-07-18 DIAGNOSIS — Z1389 Encounter for screening for other disorder: Secondary | ICD-10-CM | POA: Diagnosis not present

## 2016-07-19 DIAGNOSIS — Z1212 Encounter for screening for malignant neoplasm of rectum: Secondary | ICD-10-CM | POA: Diagnosis not present

## 2016-07-25 ENCOUNTER — Other Ambulatory Visit: Payer: Self-pay | Admitting: Hematology and Oncology

## 2016-07-25 ENCOUNTER — Ambulatory Visit
Admission: RE | Admit: 2016-07-25 | Discharge: 2016-07-25 | Disposition: A | Discharge status: Home / Self Care | Payer: Medicare Other | Source: Ambulatory Visit | Attending: Hematology and Oncology | Admitting: Hematology and Oncology

## 2016-07-25 DIAGNOSIS — R921 Mammographic calcification found on diagnostic imaging of breast: Secondary | ICD-10-CM

## 2016-07-25 DIAGNOSIS — D0512 Intraductal carcinoma in situ of left breast: Secondary | ICD-10-CM

## 2016-07-25 DIAGNOSIS — R928 Other abnormal and inconclusive findings on diagnostic imaging of breast: Secondary | ICD-10-CM | POA: Diagnosis not present

## 2016-07-25 DIAGNOSIS — N6489 Other specified disorders of breast: Secondary | ICD-10-CM | POA: Diagnosis not present

## 2016-07-26 ENCOUNTER — Ambulatory Visit
Admission: RE | Admit: 2016-07-26 | Discharge: 2016-07-26 | Disposition: A | Discharge status: Home / Self Care | Payer: Medicare Other | Source: Ambulatory Visit | Attending: Hematology and Oncology | Admitting: Hematology and Oncology

## 2016-07-26 ENCOUNTER — Other Ambulatory Visit: Payer: Self-pay | Admitting: Hematology and Oncology

## 2016-07-26 DIAGNOSIS — R921 Mammographic calcification found on diagnostic imaging of breast: Secondary | ICD-10-CM

## 2016-07-26 DIAGNOSIS — D0512 Intraductal carcinoma in situ of left breast: Secondary | ICD-10-CM | POA: Diagnosis not present

## 2016-07-27 ENCOUNTER — Telehealth: Payer: Self-pay

## 2016-07-27 NOTE — Telephone Encounter (Signed)
Pt was diagnosed and worked up at [facility] for return of DCIS. On same side and area as prior. They are referring her to a surgeon. Pt wants to talk with Dr Lindi Adie on phone about which surgeon he would suggest. And if she should have a mastectomy rather than a lumpectomy if it keeps coming back. She has appt on 29th at [facility] but wants to talk with Dr Lindi Adie as soon as possible.

## 2016-07-27 NOTE — Telephone Encounter (Signed)
Will forward this message of concern to Dr.Gudena today.

## 2016-07-29 ENCOUNTER — Other Ambulatory Visit: Payer: Self-pay | Admitting: General Surgery

## 2016-07-29 DIAGNOSIS — D0512 Intraductal carcinoma in situ of left breast: Secondary | ICD-10-CM

## 2016-08-02 ENCOUNTER — Encounter: Payer: Self-pay | Admitting: Hematology and Oncology

## 2016-08-02 ENCOUNTER — Ambulatory Visit (HOSPITAL_BASED_OUTPATIENT_CLINIC_OR_DEPARTMENT_OTHER): Payer: Medicare Other | Admitting: Hematology and Oncology

## 2016-08-02 DIAGNOSIS — Z171 Estrogen receptor negative status [ER-]: Secondary | ICD-10-CM

## 2016-08-02 DIAGNOSIS — D0512 Intraductal carcinoma in situ of left breast: Secondary | ICD-10-CM | POA: Diagnosis not present

## 2016-08-02 MED ORDER — CETIRIZINE HCL 10 MG PO TABS: 10.0000 mg | ORAL_TABLET | Freq: Every day | ORAL | Status: AC

## 2016-08-02 MED ORDER — OMEGA-3-ACID ETHYL ESTERS 1 G PO CAPS: 1.0000 g | ORAL_CAPSULE | Freq: Every day | ORAL | Status: DC

## 2016-08-02 MED ORDER — CENTRUM SILVER 50+WOMEN PO TABS: 1.0000 | ORAL_TABLET | Freq: Every day | ORAL | Status: DC

## 2016-08-02 NOTE — Assessment & Plan Note (Signed)
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 Tamoxifen toxicities: No major side effects to tamoxifen.  Recurrence: 07/25/2016: Suspicious 0.9 cm group of calcifications lower inner quadrant left breast biopsy-proven to be ER/PR negative high-grade DCIS  Pathology review: I discussed with the patient that once again she has DCIS. Being on tamoxifen did not prevent her DCIS which is ER/PR negative.  Recommendation: 1. Mastectomy Followed by surveillance

## 2016-08-02 NOTE — Progress Notes (Signed)
Patient Care Team: Velna Hatchet, MD as PCP - General (Internal Medicine)  DIAGNOSIS:  Encounter Diagnosis  Name Primary?  . Ductal carcinoma in situ (DCIS) of left breast     SUMMARY OF ONCOLOGIC HISTORY:   Ductal carcinoma in situ (DCIS) of left breast   09/2012 Initial Diagnosis    Mammographically detected left breast abnormality biopsy-proven high-grade  DCIS ER/PR/ HER-2 negative status post lumpectomy (SLN neg) followed by radiation (in [place])      10/2012 -  Anti-estrogen oral therapy    Tamoxifen 20 mg daily started in [place] (patient wishes to take it in spite of her DCIS being ER/PR negative)      07/26/2016 Relapse/Recurrence    Left breast suspicious 0.9 cm group of linear branching calcifications, biopsy revealed DCIS with calcifications ER 0%, PR 0%       CHIEF COMPLIANT: Follow-up after recently diagnosed relapse of DCIS  INTERVAL HISTORY: Colleen Hancock is a 71 year old with above-mentioned history of left breast DCIS diagnosed in 2014 and took tamoxifen for the past 4 years in spite of the DCIS being ER/PR negative. Most recently she felt a knot in the left breast and subsequently underwent a mammogram the detected a 0.9 cm group of calcifications which on biopsy revealed DCIS with calcifications ER PR negative. She is here today to discuss optimal treatment plan. She had seen Dr. Donne Hazel who recommended mastectomy.  REVIEW OF SYSTEMS:   Constitutional: Denies fevers, chills or abnormal weight loss Eyes: Denies blurriness of vision Ears, nose, mouth, throat, and face: Denies mucositis or sore throat Respiratory: Denies cough, dyspnea or wheezes Cardiovascular: Denies palpitation, chest discomfort Gastrointestinal:  Denies nausea, heartburn or change in bowel habits Skin: Denies abnormal skin rashes Lymphatics: Denies new lymphadenopathy or easy bruising Neurological:Denies numbness, tingling or new weaknesses Behavioral/Psych: Mood is  stable, no new changes  Extremities: No lower extremity edema Breast:  denies any pain or lumps or nodules in either breasts All other systems were reviewed with the patient and are negative.  I have reviewed the past medical history, past surgical history, social history and family history with the patient and they are unchanged from previous note.  ALLERGIES:  is allergic to tetracyclines & related; pentazocine lactate; clams [shellfish allergy]; and fentanyl.  MEDICATIONS:  Current Outpatient Prescriptions  Medication Sig Dispense Refill  . albuterol (PROVENTIL HFA;VENTOLIN HFA) 108 (90 BASE) MCG/ACT inhaler Inhale 1 puff into the lungs every 6 (six) hours as needed for wheezing or shortness of breath.     Marland Kitchen aspirin 81 MG tablet Take 81 mg by mouth every other day.     Marland Kitchen atorvastatin (LIPITOR) 40 MG tablet Take 40 mg by mouth daily.      . cholestyramine (QUESTRAN) 4 G packet Take 1 packet by mouth 1 day or 1 dose.      . dicyclomine (BENTYL) 10 MG capsule Take 10 mg by mouth daily.     Marland Kitchen levofloxacin (LEVAQUIN) 750 MG tablet Take 1 tablet (750 mg total) by mouth daily. 7 tablet 0  . tamoxifen (NOLVADEX) 20 MG tablet Take 1 tablet (20 mg total) by mouth daily. 90 tablet 3   No current facility-administered medications for this visit.     PHYSICAL EXAMINATION: ECOG PERFORMANCE STATUS: 1 - Symptomatic but completely ambulatory  Vitals:   08/02/16 1544  BP: (!) 109/59  Pulse: 78  Resp: 18  Temp: 98.6 F (37 C)   Filed Weights   08/02/16 1544  Weight: 194  lb 11.2 oz (88.3 kg)    GENERAL:alert, no distress and comfortable SKIN: skin color, texture, turgor are normal, no rashes or significant lesions EYES: normal, Conjunctiva are pink and non-injected, sclera clear OROPHARYNX:no exudate, no erythema and lips, buccal mucosa, and tongue normal  NECK: supple, thyroid normal size, non-tender, without nodularity LYMPH:  no palpable lymphadenopathy in the cervical, axillary or  inguinal LUNGS: clear to auscultation and percussion with normal breathing effort HEART: regular rate & rhythm and no murmurs and no lower extremity edema ABDOMEN:abdomen soft, non-tender and normal bowel sounds MUSCULOSKELETAL:no cyanosis of digits and no clubbing  NEURO: alert & oriented x 3 with fluent speech, no focal motor/sensory deficits EXTREMITIES: No lower extremity edema  LABORATORY DATA:  I have reviewed the data as listed   Chemistry      Component Value Date/Time   NA 138 04/12/2014 0515   K 3.6 04/12/2014 0515   CL 109 04/12/2014 0515   CO2 20 04/12/2014 0515   BUN 9 04/12/2014 0515   CREATININE 1.12 (H) 04/12/2014 0515      Component Value Date/Time   CALCIUM 7.7 (L) 04/12/2014 0515   ALKPHOS 53 04/11/2014 1305   AST 36 04/11/2014 1305   ALT 30 04/11/2014 1305   BILITOT 0.5 04/11/2014 1305       Lab Results  Component Value Date   WBC 6.0 04/12/2014   HGB 11.2 (L) 04/12/2014   HCT 33.8 (L) 04/12/2014   MCV 95.5 04/12/2014   PLT 133 (L) 04/12/2014   NEUTROABS 6.4 04/11/2014    ASSESSMENT & PLAN:  Ductal carcinoma in situ (DCIS) of left breast Left breast DCIS ER/PR negative high-grade status post lumpectomy July 2014 currently on adjuvant tamoxifen since September 2014. Tamoxifen toxicities: No major side effects to tamoxifen.  Recurrence: 07/25/2016: Suspicious 0.9 cm group of calcifications lower inner quadrant left breast biopsy-proven to be ER/PR negative high-grade DCIS  Pathology review: I discussed with the patient that once again she has DCIS. Being on tamoxifen did not prevent her DCIS which is ER/PR negative.  Recommendation: 1. Mastectomy Followed by surveillance I recommended the patient discontinue tamoxifen. Return to clinic after surgery to discuss the final pathology report  I spent 25 minutes talking to the patient of which more than half was spent in counseling and coordination of care.  No orders of the defined types were  placed in this encounter.  The patient has a good understanding of the overall plan. she agrees with it. she will call with any problems that may develop before the next visit here.   Rulon Eisenmenger, MD 08/02/16

## 2016-08-16 ENCOUNTER — Encounter (HOSPITAL_COMMUNITY): Payer: Self-pay

## 2016-08-16 ENCOUNTER — Encounter (HOSPITAL_COMMUNITY)
Admission: RE | Admit: 2016-08-16 | Discharge: 2016-08-16 | Disposition: A | Discharge status: Home / Self Care | Payer: Medicare Other | Source: Ambulatory Visit | Attending: General Surgery | Admitting: General Surgery

## 2016-08-16 DIAGNOSIS — E7800 Pure hypercholesterolemia, unspecified: Secondary | ICD-10-CM | POA: Diagnosis not present

## 2016-08-16 DIAGNOSIS — Z79899 Other long term (current) drug therapy: Secondary | ICD-10-CM | POA: Diagnosis not present

## 2016-08-16 DIAGNOSIS — E785 Hyperlipidemia, unspecified: Secondary | ICD-10-CM | POA: Diagnosis not present

## 2016-08-16 DIAGNOSIS — Z79810 Long term (current) use of selective estrogen receptor modulators (SERMs): Secondary | ICD-10-CM | POA: Diagnosis not present

## 2016-08-16 DIAGNOSIS — J45909 Unspecified asthma, uncomplicated: Secondary | ICD-10-CM | POA: Diagnosis not present

## 2016-08-16 DIAGNOSIS — Z923 Personal history of irradiation: Secondary | ICD-10-CM | POA: Diagnosis not present

## 2016-08-16 DIAGNOSIS — D0512 Intraductal carcinoma in situ of left breast: Secondary | ICD-10-CM | POA: Diagnosis not present

## 2016-08-16 DIAGNOSIS — Z6833 Body mass index (BMI) 33.0-33.9, adult: Secondary | ICD-10-CM | POA: Diagnosis not present

## 2016-08-16 HISTORY — DX: Unspecified asthma, uncomplicated: J45.909

## 2016-08-16 HISTORY — DX: Gastro-esophageal reflux disease without esophagitis: K21.9

## 2016-08-16 HISTORY — DX: Other complications of anesthesia, initial encounter: T88.59XA

## 2016-08-16 HISTORY — DX: Adverse effect of unspecified anesthetic, initial encounter: T41.45XA

## 2016-08-16 HISTORY — DX: Irritable bowel syndrome without diarrhea: K58.9

## 2016-08-16 HISTORY — DX: Dyspnea, unspecified: R06.00

## 2016-08-16 HISTORY — DX: Anxiety disorder, unspecified: F41.9

## 2016-08-16 HISTORY — DX: Cardiac murmur, unspecified: R01.1

## 2016-08-16 HISTORY — DX: Unspecified osteoarthritis, unspecified site: M19.90

## 2016-08-16 LAB — CBC
HCT: 39.4 % (ref 36.0–46.0)
HEMOGLOBIN: 13.1 g/dL (ref 12.0–15.0)
MCH: 31.8 pg (ref 26.0–34.0)
MCHC: 33.2 g/dL (ref 30.0–36.0)
MCV: 95.6 fL (ref 78.0–100.0)
Platelets: 180 10*3/uL (ref 150–400)
RBC: 4.12 MIL/uL (ref 3.87–5.11)
RDW: 12.1 % (ref 11.5–15.5)
WBC: 6.2 10*3/uL (ref 4.0–10.5)

## 2016-08-16 LAB — BASIC METABOLIC PANEL
Anion gap: 8 (ref 5–15)
BUN: 17 mg/dL (ref 6–20)
CALCIUM: 9.2 mg/dL (ref 8.9–10.3)
CHLORIDE: 105 mmol/L (ref 101–111)
CO2: 26 mmol/L (ref 22–32)
Creatinine, Ser: 1.1 mg/dL — ABNORMAL HIGH (ref 0.44–1.00)
GFR calc non Af Amer: 49 mL/min — ABNORMAL LOW (ref >60)
GFR, EST AFRICAN AMERICAN: 57 mL/min — AB (ref >60)
GLUCOSE: 169 mg/dL — AB (ref 65–99)
Potassium: 3.8 mmol/L (ref 3.5–5.1)
Sodium: 139 mmol/L (ref 135–145)

## 2016-08-16 NOTE — Pre-Procedure Instructions (Signed)
Colleen Hancock  08/16/2016      [facility] [place], [place] [address] [place] [address] Phone: [phone] Fax: [phone]  [facility] [place], [place] [address] [place] [address] Phone: [phone] Fax: [phone]    Your procedure is scheduled on Thurs. June 14  Report to [facility] Admitting at 5:30 A.M.  Call this number if you have problems the morning of surgery:  [phone]   Remember:  Do not eat food or drink liquids after midnight on Wed. June 13         EXCEPT  DRINK BOOST BREEZE   3:30 a.m.   Take these medicines the morning of surgery with A SIP OF WATER :albuterol inhaler if needed-bring to hospital, bentyl, eye drops if needed, tramadol if needed             Stop advil, motrin, aleve, ibuprofen, BC Powders, Goody's, vitamins/herbal medicines: fish oil,   Do not wear jewelry, make-up or nail polish.  Do not wear lotions, powders, or perfumes, or deoderant.  Do not shave 48 hours prior to surgery.  Men may shave face and neck.  Do not bring valuables to the hospital.  [facility] is not responsible for any belongings or valuables.  Contacts, dentures or bridgework may not be worn into surgery.  Leave your suitcase in the car.  After surgery it may be brought to your room.  For patients admitted to the hospital, discharge time will be determined by your treatment team.  Patients discharged the day of surgery will not be allowed to drive home.    Special instructions:  [place] [facility]- Preparing For Surgery  Before surgery, you can play an important role. Because skin is not sterile, your skin needs to be as free of germs as possible. You can reduce the number of germs on your skin by washing with CHG (chlorahexidine gluconate) Soap before surgery.  CHG is an antiseptic cleaner which kills germs and bonds with the  skin to continue killing germs even after washing.  Please do not use if you have an allergy to CHG or antibacterial soaps. If your skin becomes reddened/irritated stop using the CHG.  Do not shave (including legs and underarms) for at least 48 hours prior to first CHG shower. It is OK to shave your face.  Please follow these instructions carefully.   1. Shower the NIGHT BEFORE SURGERY and the MORNING OF SURGERY with CHG.   2. If you chose to wash your hair, wash your hair first as usual with your normal shampoo.  3. After you shampoo, rinse your hair and body thoroughly to remove the shampoo.  4. Use CHG as you would any other liquid soap. You can apply CHG directly to the skin and wash gently with a scrungie or a clean washcloth.   5. Apply the CHG Soap to your body ONLY FROM THE NECK DOWN.  Do not use on open wounds or open sores. Avoid contact with your eyes, ears, mouth and genitals (private parts). Wash genitals (private parts) with your normal soap.  6. Wash thoroughly, paying special attention to the area where your surgery will be performed.  7. Thoroughly rinse your body with warm water from the neck down.  8. DO NOT shower/wash with your normal soap after using and rinsing off the CHG Soap.  9. Pat yourself  dry with a CLEAN TOWEL.   10. Wear CLEAN PAJAMAS   11. Place CLEAN SHEETS on your bed the night of your first shower and DO NOT SLEEP WITH PETS.    Day of Surgery: Do not apply any deodorants/lotions. Please wear clean clothes to the hospital/surgery center.      Please read over the following fact sheets that you were given. Coughing and Deep Breathing and Surgical Site Infection Prevention

## 2016-08-16 NOTE — Progress Notes (Signed)
PCP: Dr. Ardeth Perfect @ [facility]

## 2016-08-18 ENCOUNTER — Ambulatory Visit (HOSPITAL_COMMUNITY): Payer: Medicare Other | Admitting: Certified Registered Nurse Anesthetist

## 2016-08-18 ENCOUNTER — Encounter (HOSPITAL_COMMUNITY)
Admission: RE | Disposition: A | Discharge status: Home / Self Care | Payer: Self-pay | Source: Ambulatory Visit | Attending: General Surgery

## 2016-08-18 ENCOUNTER — Encounter (HOSPITAL_COMMUNITY)
Admission: RE | Admit: 2016-08-18 | Discharge: 2016-08-18 | Disposition: A | Discharge status: Home / Self Care | Payer: Medicare Other | Source: Ambulatory Visit | Attending: General Surgery | Admitting: General Surgery

## 2016-08-18 ENCOUNTER — Encounter (HOSPITAL_COMMUNITY): Payer: Self-pay | Admitting: *Deleted

## 2016-08-18 ENCOUNTER — Observation Stay (HOSPITAL_COMMUNITY)
Admission: RE | Admit: 2016-08-18 | Discharge: 2016-08-20 | Disposition: A | Discharge status: Home / Self Care | Payer: Medicare Other | Source: Ambulatory Visit | Attending: General Surgery | Admitting: General Surgery

## 2016-08-18 DIAGNOSIS — Z6833 Body mass index (BMI) 33.0-33.9, adult: Secondary | ICD-10-CM | POA: Diagnosis not present

## 2016-08-18 DIAGNOSIS — J45909 Unspecified asthma, uncomplicated: Secondary | ICD-10-CM | POA: Diagnosis not present

## 2016-08-18 DIAGNOSIS — Z79810 Long term (current) use of selective estrogen receptor modulators (SERMs): Secondary | ICD-10-CM | POA: Diagnosis not present

## 2016-08-18 DIAGNOSIS — E7800 Pure hypercholesterolemia, unspecified: Secondary | ICD-10-CM | POA: Diagnosis not present

## 2016-08-18 DIAGNOSIS — D0512 Intraductal carcinoma in situ of left breast: Principal | ICD-10-CM | POA: Diagnosis present

## 2016-08-18 DIAGNOSIS — Z79899 Other long term (current) drug therapy: Secondary | ICD-10-CM | POA: Diagnosis not present

## 2016-08-18 DIAGNOSIS — G8918 Other acute postprocedural pain: Secondary | ICD-10-CM | POA: Diagnosis not present

## 2016-08-18 DIAGNOSIS — E785 Hyperlipidemia, unspecified: Secondary | ICD-10-CM | POA: Diagnosis not present

## 2016-08-18 DIAGNOSIS — Z923 Personal history of irradiation: Secondary | ICD-10-CM | POA: Diagnosis not present

## 2016-08-18 HISTORY — PX: MASTECTOMY W/ SENTINEL NODE BIOPSY: SHX2001

## 2016-08-18 SURGERY — MASTECTOMY WITH SENTINEL LYMPH NODE BIOPSY
Anesthesia: Regional | Site: Breast | Laterality: Left

## 2016-08-18 MED ORDER — HYDROMORPHONE HCL 1 MG/ML IJ SOLN
INTRAMUSCULAR | Status: AC
  Filled 2016-08-18: qty 0.5

## 2016-08-18 MED ORDER — MIDAZOLAM HCL 2 MG/2ML IJ SOLN
INTRAMUSCULAR | Status: AC
Start: 2016-08-18 — End: 2016-08-18
  Filled 2016-08-18: qty 2

## 2016-08-18 MED ORDER — CEFAZOLIN SODIUM-DEXTROSE 2-4 GM/100ML-% IV SOLN
2.0000 g | INTRAVENOUS | Status: AC
  Administered 2016-08-18: 2 g via INTRAVENOUS
  Filled 2016-08-18: qty 100

## 2016-08-18 MED ORDER — CHLORHEXIDINE GLUCONATE CLOTH 2 % EX PADS: 6.0000 | MEDICATED_PAD | Freq: Once | CUTANEOUS | Status: DC

## 2016-08-18 MED ORDER — OXYCODONE HCL 5 MG PO TABS
5.0000 mg | ORAL_TABLET | ORAL | Status: DC | PRN
  Administered 2016-08-18 (×2): 5 mg via ORAL
  Administered 2016-08-19 (×2): 10 mg via ORAL
  Filled 2016-08-18: qty 1
  Filled 2016-08-18 (×2): qty 2
  Filled 2016-08-18: qty 1
  Filled 2016-08-18: qty 2

## 2016-08-18 MED ORDER — METHYLENE BLUE 0.5 % INJ SOLN
INTRAVENOUS | Status: AC
  Filled 2016-08-18: qty 10

## 2016-08-18 MED ORDER — FENTANYL CITRATE (PF) 250 MCG/5ML IJ SOLN
INTRAMUSCULAR | Status: AC
  Filled 2016-08-18: qty 5

## 2016-08-18 MED ORDER — ONDANSETRON HCL 4 MG/2ML IJ SOLN: 4.0000 mg | Freq: Four times a day (QID) | INTRAMUSCULAR | Status: DC | PRN

## 2016-08-18 MED ORDER — LORATADINE 10 MG PO TABS
10.0000 mg | ORAL_TABLET | Freq: Every day | ORAL | Status: DC
  Filled 2016-08-18 (×2): qty 1

## 2016-08-18 MED ORDER — PHENYLEPHRINE HCL 10 MG/ML IJ SOLN
INTRAVENOUS | Status: DC | PRN
  Administered 2016-08-18: 20 ug/min via INTRAVENOUS

## 2016-08-18 MED ORDER — ACETAMINOPHEN 500 MG PO TABS: 1000.0000 mg | ORAL_TABLET | Freq: Four times a day (QID) | ORAL | Status: DC

## 2016-08-18 MED ORDER — ONDANSETRON HCL 4 MG/2ML IJ SOLN
INTRAMUSCULAR | Status: DC | PRN
  Administered 2016-08-18: 4 mg via INTRAVENOUS

## 2016-08-18 MED ORDER — 0.9 % SODIUM CHLORIDE (POUR BTL) OPTIME
TOPICAL | Status: DC | PRN
  Administered 2016-08-18: 1000 mL

## 2016-08-18 MED ORDER — MORPHINE SULFATE (PF) 4 MG/ML IV SOLN: 2.0000 mg | INTRAVENOUS | Status: DC | PRN

## 2016-08-18 MED ORDER — TECHNETIUM TC 99M SULFUR COLLOID FILTERED
1.0000 | Freq: Once | INTRAVENOUS | Status: AC | PRN
  Administered 2016-08-18: 1 via INTRADERMAL

## 2016-08-18 MED ORDER — OXYCODONE HCL 5 MG PO TABS: 5.0000 mg | ORAL_TABLET | Freq: Once | ORAL | Status: DC | PRN

## 2016-08-18 MED ORDER — LACTATED RINGERS IV SOLN
INTRAVENOUS | Status: DC | PRN
  Administered 2016-08-18: 07:00:00 via INTRAVENOUS

## 2016-08-18 MED ORDER — HYDROMORPHONE HCL 1 MG/ML IJ SOLN
INTRAMUSCULAR | Status: DC | PRN
  Administered 2016-08-18 (×2): 0.5 mg via INTRAVENOUS

## 2016-08-18 MED ORDER — ATORVASTATIN CALCIUM 20 MG PO TABS
20.0000 mg | ORAL_TABLET | Freq: Every day | ORAL | Status: DC
  Administered 2016-08-18 – 2016-08-20 (×3): 20 mg via ORAL
  Filled 2016-08-18 (×3): qty 1

## 2016-08-18 MED ORDER — ALBUTEROL SULFATE (2.5 MG/3ML) 0.083% IN NEBU: 2.5000 mg | INHALATION_SOLUTION | Freq: Four times a day (QID) | RESPIRATORY_TRACT | Status: DC | PRN

## 2016-08-18 MED ORDER — CHOLESTYRAMINE 4 G PO PACK
3.0000 g | PACK | Freq: Every day | ORAL | Status: DC
  Administered 2016-08-18 – 2016-08-19 (×2): 4 g via ORAL
  Filled 2016-08-18 (×4): qty 1

## 2016-08-18 MED ORDER — METHOCARBAMOL 500 MG PO TABS
500.0000 mg | ORAL_TABLET | Freq: Four times a day (QID) | ORAL | Status: DC | PRN
  Administered 2016-08-18 – 2016-08-20 (×5): 500 mg via ORAL
  Filled 2016-08-18 (×6): qty 1

## 2016-08-18 MED ORDER — OXYCODONE HCL 5 MG/5ML PO SOLN
5.0000 mg | Freq: Once | ORAL | Status: DC | PRN
Start: 2016-08-18 — End: 2016-08-18

## 2016-08-18 MED ORDER — ONDANSETRON 4 MG PO TBDP: 4.0000 mg | ORAL_TABLET | Freq: Four times a day (QID) | ORAL | Status: DC | PRN

## 2016-08-18 MED ORDER — PROPOFOL 10 MG/ML IV BOLUS
INTRAVENOUS | Status: AC
  Filled 2016-08-18: qty 20

## 2016-08-18 MED ORDER — HYDROMORPHONE HCL 1 MG/ML IJ SOLN
0.2500 mg | INTRAMUSCULAR | Status: DC | PRN
  Administered 2016-08-18: 0.25 mg via INTRAVENOUS

## 2016-08-18 MED ORDER — SODIUM CHLORIDE 0.9 % IV SOLN
INTRAVENOUS | Status: DC
  Administered 2016-08-18: 18:00:00 via INTRAVENOUS

## 2016-08-18 MED ORDER — POLYVINYL ALCOHOL 1.4 % OP SOLN
1.0000 [drp] | OPHTHALMIC | Status: DC | PRN
  Filled 2016-08-18: qty 15

## 2016-08-18 MED ORDER — CHOLESTYRAMINE 4 G PO PACK: 3.0000 g | PACK | Freq: Every day | ORAL | Status: DC

## 2016-08-18 MED ORDER — SIMETHICONE 80 MG PO CHEW: 40.0000 mg | CHEWABLE_TABLET | Freq: Four times a day (QID) | ORAL | Status: DC | PRN

## 2016-08-18 MED ORDER — POLYETHYL GLYCOL-PROPYL GLYCOL 0.4-0.3 % OP SOLN: 1.0000 [drp] | Freq: Every day | OPHTHALMIC | Status: DC | PRN

## 2016-08-18 MED ORDER — MIDAZOLAM HCL 5 MG/5ML IJ SOLN
INTRAMUSCULAR | Status: DC | PRN
  Administered 2016-08-18: 2 mg via INTRAVENOUS

## 2016-08-18 MED ORDER — SODIUM CHLORIDE 0.9 % IJ SOLN
INTRAVENOUS | Status: DC | PRN
  Administered 2016-08-18: 08:00:00 via INTRADERMAL

## 2016-08-18 MED ORDER — PROPOFOL 10 MG/ML IV BOLUS
INTRAVENOUS | Status: DC | PRN
  Administered 2016-08-18: 150 mg via INTRAVENOUS

## 2016-08-18 MED ORDER — EPHEDRINE SULFATE 50 MG/ML IJ SOLN
INTRAMUSCULAR | Status: DC | PRN
  Administered 2016-08-18 (×2): 5 mg via INTRAVENOUS

## 2016-08-18 MED ORDER — DICYCLOMINE HCL 10 MG PO CAPS
10.0000 mg | ORAL_CAPSULE | Freq: Every day | ORAL | Status: DC
  Administered 2016-08-19 – 2016-08-20 (×2): 10 mg via ORAL
  Filled 2016-08-18 (×3): qty 1

## 2016-08-18 SURGICAL SUPPLY — 64 items
APPLIER CLIP 9.375 MED OPEN (MISCELLANEOUS)
BINDER BREAST LRG (GAUZE/BANDAGES/DRESSINGS) IMPLANT
BINDER BREAST XLRG (GAUZE/BANDAGES/DRESSINGS) IMPLANT
BINDER BREAST XXLRG (GAUZE/BANDAGES/DRESSINGS) ×2 IMPLANT
BIOPATCH RED 1 DISK 7.0 (GAUZE/BANDAGES/DRESSINGS) ×2 IMPLANT
CANISTER SUCT 3000ML PPV (MISCELLANEOUS) ×2 IMPLANT
CHLORAPREP W/TINT 26ML (MISCELLANEOUS) ×2 IMPLANT
CLIP APPLIE 9.375 MED OPEN (MISCELLANEOUS) IMPLANT
CONT SPEC 4OZ CLIKSEAL STRL BL (MISCELLANEOUS) ×2 IMPLANT
COVER PROBE W GEL 5X96 (DRAPES) ×2 IMPLANT
COVER SURGICAL LIGHT HANDLE (MISCELLANEOUS) ×2 IMPLANT
DERMABOND ADVANCED (GAUZE/BANDAGES/DRESSINGS) ×1
DERMABOND ADVANCED .7 DNX12 (GAUZE/BANDAGES/DRESSINGS) ×1 IMPLANT
DRAIN CHANNEL 19F RND (DRAIN) ×2 IMPLANT
DRAPE LAPAROSCOPIC ABDOMINAL (DRAPES) ×2 IMPLANT
DRSG TEGADERM 4X4.75 (GAUZE/BANDAGES/DRESSINGS) ×2 IMPLANT
ELECT BLADE 4.0 EZ CLEAN MEGAD (MISCELLANEOUS) ×2
ELECT CAUTERY BLADE 6.4 (BLADE) ×2 IMPLANT
ELECT REM PT RETURN 9FT ADLT (ELECTROSURGICAL) ×2
ELECTRODE BLDE 4.0 EZ CLN MEGD (MISCELLANEOUS) ×1 IMPLANT
ELECTRODE REM PT RTRN 9FT ADLT (ELECTROSURGICAL) ×1 IMPLANT
EVACUATOR SILICONE 100CC (DRAIN) ×2 IMPLANT
GAUZE SPONGE 4X4 12PLY STRL (GAUZE/BANDAGES/DRESSINGS) ×2 IMPLANT
GAUZE SPONGE 4X4 12PLY STRL LF (GAUZE/BANDAGES/DRESSINGS) ×2 IMPLANT
GLOVE BIO SURGEON STRL SZ7 (GLOVE) ×2 IMPLANT
GLOVE BIOGEL PI IND STRL 6.5 (GLOVE) ×1 IMPLANT
GLOVE BIOGEL PI IND STRL 7.0 (GLOVE) ×1 IMPLANT
GLOVE BIOGEL PI IND STRL 7.5 (GLOVE) ×1 IMPLANT
GLOVE BIOGEL PI INDICATOR 6.5 (GLOVE) ×1
GLOVE BIOGEL PI INDICATOR 7.0 (GLOVE) ×1
GLOVE BIOGEL PI INDICATOR 7.5 (GLOVE) ×1
GLOVE SURG SS PI 6.5 STRL IVOR (GLOVE) ×2 IMPLANT
GLOVE SURG SS PI 7.0 STRL IVOR (GLOVE) ×2 IMPLANT
GOWN STRL REUS W/ TWL LRG LVL3 (GOWN DISPOSABLE) ×4 IMPLANT
GOWN STRL REUS W/TWL LRG LVL3 (GOWN DISPOSABLE) ×4
KIT BASIN OR (CUSTOM PROCEDURE TRAY) ×2 IMPLANT
KIT ROOM TURNOVER OR (KITS) ×2 IMPLANT
LIGHT WAVEGUIDE WIDE FLAT (MISCELLANEOUS) ×2 IMPLANT
MARKER SKIN DUAL TIP RULER LAB (MISCELLANEOUS) ×2 IMPLANT
NEEDLE 18GX1X1/2 (RX/OR ONLY) (NEEDLE) ×2 IMPLANT
NEEDLE FILTER BLUNT 18X 1/2SAF (NEEDLE) ×1
NEEDLE FILTER BLUNT 18X1 1/2 (NEEDLE) ×1 IMPLANT
NEEDLE HYPO 25GX1X1/2 BEV (NEEDLE) ×2 IMPLANT
NS IRRIG 1000ML POUR BTL (IV SOLUTION) ×2 IMPLANT
PACK GENERAL/GYN (CUSTOM PROCEDURE TRAY) ×2 IMPLANT
PAD ABD 8X10 STRL (GAUZE/BANDAGES/DRESSINGS) ×4 IMPLANT
PAD ARMBOARD 7.5X6 YLW CONV (MISCELLANEOUS) ×2 IMPLANT
PIN SAFETY STERILE (MISCELLANEOUS) IMPLANT
SPECIMEN JAR X LARGE (MISCELLANEOUS) ×2 IMPLANT
SPONGE LAP 18X18 X RAY DECT (DISPOSABLE) ×2 IMPLANT
STAPLER VISISTAT 35W (STAPLE) ×2 IMPLANT
STRIP CLOSURE SKIN 1/2X4 (GAUZE/BANDAGES/DRESSINGS) ×2 IMPLANT
SUT ETHILON 2 0 FS 18 (SUTURE) ×2 IMPLANT
SUT ETHILON 3 0 FSL (SUTURE) ×4 IMPLANT
SUT MNCRL AB 4-0 PS2 18 (SUTURE) ×2 IMPLANT
SUT MON AB 4-0 PC3 18 (SUTURE) ×2 IMPLANT
SUT SILK 2 0 SH (SUTURE) IMPLANT
SUT VIC AB 3-0 54X BRD REEL (SUTURE) ×1 IMPLANT
SUT VIC AB 3-0 BRD 54 (SUTURE) ×1
SUT VIC AB 3-0 SH 18 (SUTURE) ×2 IMPLANT
SUT VIC AB 3-0 SH 8-18 (SUTURE) ×4 IMPLANT
SYR CONTROL 10ML LL (SYRINGE) ×2 IMPLANT
TOWEL OR 17X24 6PK STRL BLUE (TOWEL DISPOSABLE) ×2 IMPLANT
TOWEL OR 17X26 10 PK STRL BLUE (TOWEL DISPOSABLE) IMPLANT

## 2016-08-18 NOTE — Op Note (Signed)
Preoperative diagnosis: recurrent left breast dcis s/p lump/radiotherapy at outside facility Postoperative diagnosis: saa Procedure 1. Left total mastectomy 2. Left deep axillary sentinel node biopsy 3. Injection blue dye for sn identification Surgeon Dr Serita Grammes EBL 50 cc Drains 19 Fr Blake drain to mastectomy space Complications none Specimens left breast marked short superior, long lateral Left axillary sentinel node with count of 69 Sponge and needle count correct times two dispo to recovery stable  Indications:70 yof with prior dcis treated in [facility] with lumpectomy/radiotherapy with recurrent dcis near prior site.  We discussed options and have elected to proceed with total mastectomy, attempted sn biopsy without reconstruction.  Procedure: After informed consent obtained patient was taken to the OR. She was given antibiotics and scds were in place.  She had pectoral block placed. She was placed under general anesthesia without complication.  She was prepped and draped in standard sterile surgical fashion. A timeout was performed.  I injected a mixture of saline and blue dye around the areola and then massaged this. I made a large elliptical incision to encompass old incision and nipple areola complex. I then created flaps to the clavicle, parasternal area, inframammary fold and the latissiumus laterally. I then removed the breast and fascia from the pectoralis fascia. This was marked and passed of the table as above. I then was able to locate a single sentinel node with count as above. I excised this and there was no background radioactivity.  There was no blue dye present.  I then obtained hemostasis.  I inserted a 19 Fr blake drain and secured this with a 2-0 nylon. biopatch and tegaderm were eventually placed. I then closed the medial 2/3's of incision with 3-0 vicryl. There was a lot of excess tissue that I then excised and closed the lateral portion in a Y fashion with 3-0 vicryl.  Skin was closed with 4-0 monocryl.. I did placed some external 3-0 nylon sutures on inferior portion of the Y.  I then placed glue and steristrips.  Binder was placed. She was extubated and transferred to recovery stable

## 2016-08-18 NOTE — Anesthesia Procedure Notes (Addendum)
Procedure Name: LMA Insertion Performed by: Shirlyn Goltz Pre-anesthesia Checklist: Patient identified, Emergency Drugs available, Suction available, Patient being monitored and Timeout performed Patient Re-evaluated:Patient Re-evaluated prior to inductionOxygen Delivery Method: Circle system utilized Preoxygenation: Pre-oxygenation with 100% oxygen Intubation Type: IV induction LMA: LMA inserted LMA Size: 4.0 Tube secured with: Tape Dental Injury: Teeth and Oropharynx as per pre-operative assessment

## 2016-08-18 NOTE — Progress Notes (Signed)
Discontinued Tylenol order as patient states she is allergic to it.

## 2016-08-18 NOTE — Brief Op Note (Signed)
08/18/2016  9:21 AM  PATIENT:  Colleen Hancock  71 y.o. female  PRE-OPERATIVE DIAGNOSIS:  LEFT BREAST DCIS  POST-OPERATIVE DIAGNOSIS:  LEFT BREAST DCIS  PROCEDURE:  Procedure(s): LEFT TOTAL MASTECTOMY WITH SENTINEL LYMPH NODE BIOPSY WITH BLUE DYE INJECTION (Left)  SURGEON:  Surgeon(s) and Role:    Rolm Bookbinder, MD - Primary  PHYSICIAN ASSISTANT:   ASSISTANTS: none   ANESTHESIA:   general with pec block  EBL:  Total I/O In: -  Out: 150 [Blood:150]  BLOOD ADMINISTERED:none  DRAINS: 1 blake drain  LOCAL MEDICATIONS USED:  NONE  SPECIMEN:  Simple Mastectomy  DISPOSITION OF SPECIMEN:  PATHOLOGY  COUNTS:  YES  TOURNIQUET:  * No tourniquets in log *  DICTATION: .Dragon Dictation  PLAN OF CARE: Admit for overnight observation  PATIENT DISPOSITION:  PACU - hemodynamically stable.   Delay start of Pharmacological VTE agent (>24hrs) due to surgical blood loss or risk of bleeding: not app

## 2016-08-18 NOTE — H&P (Signed)
71 yof referred by Dr Curlene Dolphin for new left breast cancer. She has prior history of left lump with sentinel node in [place] that sounds like was hr negative but was treated with tamoxifen. this was dcis. she had hematoma after last surgery. she moved to [place] and lives with daughter now as her husband has had cardiac issues. she underwent routine screening mm recently that shows c density breasts. near the lumpectomy site in liq of left breast there is a 34mm group of calcifications. there was concern over palpable site also that is not evident on mm or [place]. she underwent stereo biopsy with clip placement. 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 she is here today with her daughter and her husband   Past Surgical History Dalbert Mayotte, [place]; 07/29/2016 10:32 AM) Breast Mass; Local Excision  Left. Cesarean Section - 1  Gallbladder Surgery - Laparoscopic  Sentinel Lymph Node Biopsy   Diagnostic Studies History Dalbert Mayotte, [place]; 07/29/2016 10:32 AM) Colonoscopy  1-5 years ago Mammogram  within last year Pap Smear  1-5 years ago  Allergies Dalbert Mayotte, CMA; 07/29/2016 10:36 AM) Fish  shell fish Acetaminophen  Tetracyclines  FentaNYL *ANALGESICS - OPIOID*  Allergies Reconciled   Medication History Dalbert Mayotte, CMA; 07/29/2016 10:39 AM) Tamoxifen Citrate (20MG  Tablet, Oral) Active. Dicyclomine HCl (10MG  Capsule, Oral) Active. Proventil HFA (108 (90 Base)MCG/ACT Aerosol Soln, Inhalation) Active. Atorvastatin Calcium (40MG  Tablet, Oral) Active. Cholestyramine (4GM/DOSE Powder, Oral) Active. Caltrate 600+D Plus Minis (Oral) Specific strength unknown - Active. Centrum Silver (Oral) Specific strength unknown - Active. Medications Reconciled  Social History Dalbert Mayotte, [place]; 07/29/2016 10:32 AM) Alcohol use  Occasional alcohol use. Caffeine use  Tea. No drug use  Tobacco use  Never smoker.  Family History Dalbert Mayotte, [place]; 07/29/2016 10:32  AM) Arthritis  Brother, Daughter, Father, Mother. Cancer  Son. Cerebrovascular Accident  Mother. Diabetes Mellitus  Brother, Son. Heart Disease  Brother, Father, Son. Heart disease in female family member before age 81  Kidney Disease  Brother. Seizure disorder  Brother.  Pregnancy / Birth History Dalbert Mayotte, [place]; 07/29/2016 10:32 AM) Age at menarche  39 years. Age of menopause  51-55 Contraceptive History  Oral contraceptives. Gravida  3 Maternal age  47-20 Para  2  Other Problems Dalbert Mayotte, [place]; 07/29/2016 10:32 AM) Arthritis  Asthma  Breast Cancer  Cholelithiasis  Gastric Ulcer  General anesthesia - complications  Hypercholesterolemia  Lump In Breast   Review of Systems Dalbert Mayotte CMA; 07/29/2016 10:32 AM) General Present- Fatigue, Weight Gain and Weight Loss. Not Present- Appetite Loss, Chills, Fever and Night Sweats. Skin Present- Dryness. Not Present- Change in Wart/Mole, Hives, Jaundice, New Lesions, Non-Healing Wounds, Rash and Ulcer. HEENT Present- Earache, Seasonal Allergies and Wears glasses/contact lenses. Not Present- Hearing Loss, Hoarseness, Nose Bleed, Oral Ulcers, Ringing in the Ears, Sinus Pain, Sore Throat, Visual Disturbances and Yellow Eyes. Respiratory Present- Chronic Cough. Not Present- Bloody sputum, Difficulty Breathing, Snoring and Wheezing. Cardiovascular Present- Shortness of Breath and Swelling of Extremities. Not Present- Chest Pain, Difficulty Breathing Lying Down, Leg Cramps, Palpitations and Rapid Heart Rate. Gastrointestinal Present- Abdominal Pain, Bloating, Chronic diarrhea, Excessive gas and Hemorrhoids. Not Present- Bloody Stool, Change in Bowel Habits, Constipation, Difficulty Swallowing, Gets full quickly at meals, Indigestion, Nausea, Rectal Pain and Vomiting. Female Genitourinary Present- Frequency. Not Present- Nocturia, Painful Urination, Pelvic Pain and Urgency. Musculoskeletal Present- Back Pain and  Joint Stiffness. Not Present- Joint Pain, Muscle Pain, Muscle Weakness and Swelling of Extremities. Neurological Present- Decreased Memory  and Trouble walking. Not Present- Fainting, Headaches, Numbness, Seizures, Tingling, Tremor and Weakness. Psychiatric Present- Anxiety. Not Present- Bipolar, Change in Sleep Pattern, Depression, Fearful and Frequent crying. Endocrine Present- Hair Changes and Heat Intolerance. Not Present- Cold Intolerance, Excessive Hunger, Hot flashes and New Diabetes. Hematology Not Present- Blood Thinners, Easy Bruising, Excessive bleeding, Gland problems, HIV and Persistent Infections.  Vitals Dalbert Mayotte CMA; 07/29/2016 10:40 AM) 07/29/2016 10:39 AM Weight: 194 lb Height: 64in Body Surface Area: 1.93 m Body Mass Index: 33.3 kg/m  Temp.: 99.79F  Pulse: 85 (Regular)  BP: 160/80 (Sitting, Left Arm, Standard) Physical Exam Rolm Bookbinder MD; 07/30/2016 3:24 PM) General Mental Status-Alert. Orientation-Oriented X3. Head and Neck Note: no thyromegaly Eye Sclera/Conjunctiva - Bilateral-No scleral icterus. Pupil - Bilateral-Equal. Chest and Lung Exam Chest and lung exam reveals -quiet, even and easy respiratory effort with no use of accessory muscles and on auscultation, normal breath sounds, no adventitious sounds and normal vocal resonance. Breast Nipples-No Discharge. Note: mulitple left breast incisions, lumpectomy incision is radial from nac inferiorly, scar tissue throughout liq no discrete mass Cardiovascular Cardiovascular examination reveals -normal heart sounds, regular rate and rhythm with no murmurs. Abdomen Note: soft nt Lymphatic Head & Neck General Head & Neck Lymphatics: Bilateral - Description - Normal. Axillary General Axillary Region: Bilateral - Description - Normal. Note: no [place] adenopathy   Assessment & Plan Rolm Bookbinder MD; 07/30/2016 3:27 PM) BREAST NEOPLASM, TIS (DCIS), LEFT (D05.12) Story:  Left total mastectomy, attempt repeat left axillary sn biopsy We discussed the staging and pathophysiology of breast cancer. We discussed all of the different options for treatment for breast cancer including surgery, chemotherapy, radiation therapy, Herceptin, and antiestrogen therapy. she is aware of much of this due to recent treatment. this does appear to be dcis recurrence. We discussed a sentinel lymph node biopsy as she does not appear to having lymph node involvement right now. We discussed the performance of that with injection of radioactive tracer. this might not be successful given prior surgery but will attempt. if unable to localize will stop as this does appear to be dcis. We discussed up to a 5% risk lifetime of chronic shoulder pain as well as lymphedema associated with a sentinel lymph node biopsy. I think she also needs total mastectomy due to prior radiotherapy. she desires this as well. discussed possibility of reconstruction which she declined. discussed mastectomy, postop risks and then further therapy will be based on results of surgery. we discussed there is no benefit to double mastectomy for her.

## 2016-08-18 NOTE — Transfer of Care (Signed)
Immediate Anesthesia Transfer of Care Note  Patient: Colleen Hancock  Procedure(s) Performed: Procedure(s): LEFT TOTAL MASTECTOMY WITH SENTINEL LYMPH NODE BIOPSY WITH BLUE DYE INJECTION (Left)  Patient Location: PACU  Anesthesia Type:General and Regional  Level of Consciousness: awake, alert , oriented and patient cooperative  Airway & Oxygen Therapy: Patient Spontanous Breathing and Patient connected to nasal cannula oxygen  Post-op Assessment: Report given to RN and Post -op Vital signs reviewed and stable  Post vital signs: Reviewed and stable  Last Vitals:  Vitals:   08/18/16 0630 08/18/16 0932  BP: (!) 144/40   Pulse: 84   Resp: 16   Temp: 36.9 C 36.2 C    Last Pain:  Vitals:   08/18/16 0630  TempSrc: Oral      Patients Stated Pain Goal: 5 (70/17/79 3903)  Complications: No apparent anesthesia complications

## 2016-08-18 NOTE — Interval H&P Note (Signed)
History and Physical Interval Note:  08/18/2016 7:10 AM  Colleen Hancock  has presented today for surgery, with the diagnosis of LEFT BREAST DCIS  The various methods of treatment have been discussed with the patient and family. After consideration of risks, benefits and other options for treatment, the patient has consented to  Procedure(s): LEFT TOTAL MASTECTOMY WITH SENTINEL LYMPH NODE BIOPSY WITH BLUE DYE (Left) as a surgical intervention .  The patient's history has been reviewed, patient examined, no change in status, stable for surgery.  I have reviewed the patient's chart and labs.  Questions were answered to the patient's satisfaction.     Thurza Kwiecinski

## 2016-08-18 NOTE — Care Management Note (Signed)
Case Management Note  Patient Details  Name: Kathline Banbury MRN: [id number] Date of Birth: 1945-09-30  Subjective/Objective:                    Action/Plan:  1. Left total mastectomy Expected Discharge Date:                  Expected Discharge Plan:  Home/Self Care  In-House Referral:     Discharge planning Services     Post Acute Care Choice:    Choice offered to:     DME Arranged:    DME Agency:     HH Arranged:    HH Agency:     Status of Service:  In process, will continue to follow  If discussed at Long Length of Stay Meetings, dates discussed:    Additional Comments:  Marilu Favre, RN 08/18/2016, 11:33 AM

## 2016-08-18 NOTE — Anesthesia Preprocedure Evaluation (Addendum)
Anesthesia Evaluation  Patient identified by MRN, date of birth, ID band Patient awake    Reviewed: Allergy & Precautions, NPO status , Patient's Chart, lab work & pertinent test results  History of Anesthesia Complications Negative for: history of anesthetic complications  Airway Mallampati: II  TM Distance: >3 FB Neck ROM: Full    Dental  (+) Teeth Intact   Pulmonary shortness of breath, asthma ,    breath sounds clear to auscultation       Cardiovascular negative cardio ROS   Rhythm:Regular     Neuro/Psych PSYCHIATRIC DISORDERS Anxiety negative neurological ROS     GI/Hepatic Neg liver ROS, GERD  Controlled,  Endo/Other  Morbid obesity  Renal/GU negative Renal ROS     Musculoskeletal  (+) Arthritis ,   Abdominal   Peds  Hematology negative hematology ROS (+)   Anesthesia Other Findings   Reproductive/Obstetrics                            Anesthesia Physical Anesthesia Plan  ASA: II  Anesthesia Plan: General and Regional   Post-op Pain Management:    Induction: Intravenous  PONV Risk Score and Plan: 3 and Ondansetron, Dexamethasone, Propofol and Midazolam  Airway Management Planned: Oral ETT and LMA  Additional Equipment:   Intra-op Plan:   Post-operative Plan: Extubation in OR  Informed Consent: I have reviewed the patients History and Physical, chart, labs and discussed the procedure including the risks, benefits and alternatives for the proposed anesthesia with the patient or authorized representative who has indicated his/her understanding and acceptance.   Dental advisory given  Plan Discussed with: CRNA and Surgeon  Anesthesia Plan Comments:         Anesthesia Quick Evaluation

## 2016-08-18 NOTE — Discharge Instructions (Signed)
[facility] [place] °[phone] ° °MASTECTOMY: POST OP INSTRUCTIONS ° °Always review your discharge instruction sheet given to you by the facility where your surgery was performed. °IF YOU HAVE DISABILITY OR FAMILY LEAVE FORMS, YOU MUST BRING THEM TO THE OFFICE FOR PROCESSING.   °DO NOT GIVE THEM TO YOUR DOCTOR. °A prescription for pain medication may be given to you upon discharge.  Take your pain medication as prescribed, if needed.  If narcotic pain medicine is not needed, then you may take acetaminophen (Tylenol), naprosyn (Alleve) or ibuprofen (Advil) as needed. °1. Take your usually prescribed medications unless otherwise directed. °2. If you need a refill on your pain medication, please contact your pharmacy.  They will contact our office to request authorization.  Prescriptions will not be filled after 5pm or on week-ends. °3. You should follow a light diet the first few days after arrival home, such as soup and crackers, etc.  Resume your normal diet the day after surgery. °4. Most patients will experience some swelling and bruising on the chest and underarm.  Ice packs will help.  Swelling and bruising can take several days to resolve. Wear the binder day and night until you return to the office.  °5. It is common to experience some constipation if taking pain medication after surgery.  Increasing fluid intake and taking a stool softener (such as Colace) will usually help or prevent this problem from occurring.  A mild laxative (Milk of Magnesia or Miralax) should be taken according to package instructions if there are no bowel movements after 48 hours. °6. Unless discharge instructions indicate otherwise, leave your bandage dry and in place until your next appointment in 3-5 days.  You may take a limited sponge bath.  No tube baths or showers until the drains are removed.  You may have steri-strips (small skin tapes) in place directly over the incision.  These strips should be left on the  skin for 7-10 days. If you have glue it will come off in next couple week.  Any sutures will be removed at an office visit °7. DRAINS:  If you have drains in place, it is important to keep a list of the amount of drainage produced each day in your drains.  Before leaving the hospital, you should be instructed on drain care.  Call our office if you have any questions about your drains. I will remove your drains when they put out less than 30 cc or ml for 2 consecutive days. °8. ACTIVITIES:  You may resume regular (light) daily activities beginning the next day--such as daily self-care, walking, climbing stairs--gradually increasing activities as tolerated.  You may have sexual intercourse when it is comfortable.  Refrain from any heavy lifting or straining until approved by your doctor. °a. You may drive when you are no longer taking prescription pain medication, you can comfortably wear a seatbelt, and you can safely maneuver your car and apply brakes. °b. RETURN TO WORK:  __________________________________________________________ °9. You should see your doctor in the office for a follow-up appointment approximately 3-5 days after your surgery.  Your doctor’s nurse will typically make your follow-up appointment when she calls you with your pathology report.  Expect your pathology report 3-4business days after surgery. °10. OTHER INSTRUCTIONS: ______________________________________________________________________________________________ ____________________________________________________________________________________________ °WHEN TO CALL YOUR DR Damaris Geers: °1. Fever over 101.0 °2. Nausea and/or vomiting °3. Extreme swelling or bruising °4. Continued bleeding from incision. °5. Increased pain, redness, or drainage from the incision. °The clinic staff is available   to answer your questions during regular business hours.  Please don’t hesitate to call and ask to speak to one of the nurses for clinical concerns.  If  you have a medical emergency, go to the nearest emergency room or call 911.  A surgeon from [facility] is always on call at the hospital. °[address], [address], [place], [place]  [address] ? [address] [address], [place],    [address] °[phone] ? [phone] ? FAX [phone] °Web site: [email or web address] ° °

## 2016-08-19 ENCOUNTER — Encounter (HOSPITAL_COMMUNITY): Payer: Self-pay | Admitting: General Surgery

## 2016-08-19 DIAGNOSIS — D0512 Intraductal carcinoma in situ of left breast: Secondary | ICD-10-CM | POA: Diagnosis not present

## 2016-08-19 DIAGNOSIS — E785 Hyperlipidemia, unspecified: Secondary | ICD-10-CM | POA: Diagnosis not present

## 2016-08-19 DIAGNOSIS — Z923 Personal history of irradiation: Secondary | ICD-10-CM | POA: Diagnosis not present

## 2016-08-19 DIAGNOSIS — J45909 Unspecified asthma, uncomplicated: Secondary | ICD-10-CM | POA: Diagnosis not present

## 2016-08-19 DIAGNOSIS — E7800 Pure hypercholesterolemia, unspecified: Secondary | ICD-10-CM | POA: Diagnosis not present

## 2016-08-19 LAB — BASIC METABOLIC PANEL
Anion gap: 9 (ref 5–15)
BUN: 12 mg/dL (ref 6–20)
CALCIUM: 8 mg/dL — AB (ref 8.9–10.3)
CO2: 20 mmol/L — AB (ref 22–32)
CREATININE: 0.97 mg/dL (ref 0.44–1.00)
Chloride: 105 mmol/L (ref 101–111)
GFR calc non Af Amer: 57 mL/min — ABNORMAL LOW (ref >60)
Glucose, Bld: 122 mg/dL — ABNORMAL HIGH (ref 65–99)
Potassium: 4.2 mmol/L (ref 3.5–5.1)
SODIUM: 134 mmol/L — AB (ref 135–145)

## 2016-08-19 MED ORDER — OXYCODONE HCL 5 MG PO TABS: 5.0000 mg | ORAL_TABLET | ORAL | 0 refills | Status: DC | PRN

## 2016-08-19 NOTE — Progress Notes (Signed)
1 Day Post-Op   Subjective/Chief Complaint: Sore, tol diet, voiding   Objective: Vital signs in last 24 hours: Temp:  [99 F (37.2 C)-99.8 F (37.7 C)] 99 F (37.2 C) (06/15 0509) Pulse Rate:  [77-85] 83 (06/15 0509) Resp:  [16-18] 18 (06/15 0509) BP: (118-125)/(39-48) 119/48 (06/15 0509) SpO2:  [92 %-95 %] 92 % (06/15 0509) Last BM Date: 08/18/16  Intake/Output from previous day: 06/14 0701 - 06/15 0700 In: 1941 [P.O.:210; I.V.:1731] Out: 2370 [Urine:2150; Drains:70; Blood:150] Intake/Output this shift: Total I/O In: -  Out: 430 [Urine:400; Drains:30]  mastectomy incision clean, drains with minimal serosang drainage, swelling above binder  Lab Results:   Recent Labs  08/16/16 1329  WBC 6.2  HGB 13.1  HCT 39.4  PLT 180   BMET  Recent Labs  08/16/16 1329 08/19/16 0625  NA 139 134*  K 3.8 4.2  CL 105 105  CO2 26 20*  GLUCOSE 169* 122*  BUN 17 12  CREATININE 1.10* 0.97  CALCIUM 9.2 8.0*   PT/INR No results for input(s): LABPROT, INR in the last 72 hours. ABG No results for input(s): PHART, HCO3 in the last 72 hours.  Invalid input(s): PCO2, PO2  Studies/Results: No results found.  Anti-infectives: Anti-infectives    Start     Dose/Rate Route Frequency Ordered Stop   08/18/16 0617  ceFAZolin (ANCEF) IVPB 2g/100 mL premix     2 g 200 mL/hr over 30 Minutes Intravenous On call to O.R. 08/18/16 0617 08/18/16 0743      Assessment/Plan: POD 1 left tm with left ax sn biopsy  Will dc home today if doing well with pain control   [facility] 08/19/2016

## 2016-08-19 NOTE — Care Management Obs Status (Signed)
[place] NOTIFICATION   Patient Details  Name: Colleen Hancock MRN: [id number] Date of Birth: 1945/04/18   Medicare Observation Status Notification Given:  Yes    Marilu Favre, RN 08/19/2016, 8:48 AM

## 2016-08-20 DIAGNOSIS — E785 Hyperlipidemia, unspecified: Secondary | ICD-10-CM | POA: Diagnosis not present

## 2016-08-20 DIAGNOSIS — J45909 Unspecified asthma, uncomplicated: Secondary | ICD-10-CM | POA: Diagnosis not present

## 2016-08-20 DIAGNOSIS — D0512 Intraductal carcinoma in situ of left breast: Secondary | ICD-10-CM | POA: Diagnosis not present

## 2016-08-20 DIAGNOSIS — Z923 Personal history of irradiation: Secondary | ICD-10-CM | POA: Diagnosis not present

## 2016-08-20 DIAGNOSIS — E7800 Pure hypercholesterolemia, unspecified: Secondary | ICD-10-CM | POA: Diagnosis not present

## 2016-08-20 NOTE — Progress Notes (Signed)
Pt for discharge going home, health teachings on how to empty the JP bulb and record it, next appointment, prescriptions given for pain, given all personal belongings, removed peripheral IV, her daughter at the bedside, dressing dry and intact, no complain of pain.

## 2016-08-20 NOTE — Discharge Summary (Signed)
[facility] Discharge Summary   Patient ID: Colleen Hancock MRN: [id number] DOB/AGE: 72-12-1945 72 y.o.  Admit date: 08/18/2016 Discharge date: 08/20/2016  Discharge Diagnosis Patient Active Problem List   Diagnosis Date Noted  . Breast neoplasm, Tis (DCIS), 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  . Asthma 01/26/2016  . PNA (pneumonia) 04/11/2014  . Hyperlipemia 04/11/2014  . 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   Imaging: 08/18/16 NM Sentinel Node INj-No Rpt (Breast)  Procedures Dr. Rolm Bookbinder (08/18/16) - LEFT TOTAL MASTECTOMY WITH SENTINEL LYMPH NODE BIOPSY WITH BLUE DYE INJECTION (Left)  Hospital Course:  Colleen Hancock is a 71 y.o. Female recurrent left breast cancer (DCIS) with a PMH of left lumpectomy with sentinel node in [place] that sounds like was hr negative but was treated with tamoxifen. She was admitted to the hospital on 08/18/16 to undergo total mastectomy and sentinel node BX for DCIS of left breast. She underwent the procedure without complication. 70 F drain was placed to drain mastectomy space. On POD#1 the patients vitals were stable, pain controlled, tolerating PO, and stable for discharge. She will follow up with Dr. Donne Hazel in 7-10 days.   Physical Exam: General:  Alert, NAD, pleasant, comfortable Pulm: normal effort, CTAB  Breast: breast binder removed revealing left breast incisions covered with steri-strips; no surround cellulitis, no active drainage. Mild swelling superior to mastectomy site. JP drain site c/d/i with SS drainage (50 cc/24h) CV: RRR, no m/r/g   Allergies as of 08/20/2016      Reactions   Fentanyl Other (See Comments)   Low blood pressure.   Tetracyclines & Related Hives   Tylagesic [acetaminophen] Swelling   Other    SEAFOOD > UNSPECIFIED REACTION    Clams [shellfish Allergy] Diarrhea   Nsaids Nausea And Vomiting   Stomach upset    Pentazocine Lactate Nausea And Vomiting      Medication List    TAKE  these medications   albuterol 108 (90 Base) MCG/ACT inhaler Commonly known as:  PROVENTIL HFA;VENTOLIN HFA Inhale 1 puff into the lungs every 6 (six) hours as needed for wheezing or shortness of breath.   atorvastatin 20 MG tablet Commonly known as:  LIPITOR Take 20 mg by mouth daily before breakfast.   cetirizine 10 MG tablet Commonly known as:  ZYRTEC ALLERGY Take 1 tablet (10 mg total) by mouth daily. What changed:  when to take this   cholecalciferol 1000 units tablet Commonly known as:  VITAMIN D Take 1,000 Units by mouth every evening.   cholestyramine 4 GM/DOSE powder Commonly known as:  QUESTRAN Take 0.75 packets by mouth daily before breakfast. 3/4 scoop (MUST TAKE 1 hour after dicyclomine)   dicyclomine 10 MG capsule Commonly known as:  BENTYL Take 10 mg by mouth daily at 6 (six) AM. (1st thing in the morning)   FISH OIL PO Take 720 mg by mouth every evening.   MULTIVITAMIN GUMMIES WOMENS PO Take 2 tablets by mouth every evening. One-A-Day Women's   oxyCODONE 5 MG immediate release tablet Commonly known as:  Oxy IR/ROXICODONE Take 1-2 tablets (5-10 mg total) by mouth every 4 (four) hours as needed for moderate pain.   SYSTANE 0.4-0.3 % Soln Generic drug:  Polyethyl Glycol-Propyl Glycol Place 1 drop into both eyes daily as needed (for dry/irritated eyes.).   traMADol 50 MG tablet Commonly known as:  ULTRAM Take 50 mg by mouth every 6 (six) hours as needed (for pain.).       Follow-up Information  Rolm Bookbinder, MD Follow up in 1 week(s).   Specialty:  General Surgery Contact information: [address] [place] [address] [phone]           Signed: Obie Dredge, [facility] 08/20/2016, 8:15 AM Pager: [phone] Consults: [phone] Mon-Fri 7:00 am-4:30 pm Sat-Sun 7:00 am-11:30 am

## 2016-08-22 NOTE — Anesthesia Postprocedure Evaluation (Signed)
Anesthesia Post Note  Patient: Colleen Hancock  Procedure(s) Performed: Procedure(s) (LRB): LEFT TOTAL MASTECTOMY WITH SENTINEL LYMPH NODE BIOPSY WITH BLUE DYE INJECTION (Left)     Patient location during evaluation: PACU Anesthesia Type: Regional and General Level of consciousness: awake and alert Pain management: pain level controlled Vital Signs Assessment: post-procedure vital signs reviewed and stable Respiratory status: spontaneous breathing, nonlabored ventilation, respiratory function stable and patient connected to nasal cannula oxygen Cardiovascular status: blood pressure returned to baseline and stable Postop Assessment: no signs of nausea or vomiting Anesthetic complications: no    Last Vitals:  Vitals:   08/19/16 2106 08/20/16 0520  BP: (!) 137/52 (!) 129/50  Pulse: 85 73  Resp: 18 18  Temp: 37.2 C 37.2 C    Last Pain:  Vitals:   08/20/16 0520  TempSrc: Oral  PainSc:                  Caelum Federici

## 2016-08-24 MED ORDER — BUPIVACAINE-EPINEPHRINE (PF) 0.5% -1:200000 IJ SOLN
INTRAMUSCULAR | Status: DC | PRN
  Administered 2016-08-18: 25 mL via PERINEURAL

## 2016-08-24 NOTE — Anesthesia Procedure Notes (Signed)
Anesthesia Regional Block: Pectoralis block   Pre-Anesthetic Checklist: ,, timeout performed, Correct Patient, Correct Site, Correct Laterality, Correct Procedure, Correct Position, risks and benefits discussed, surgical consent, pre-op evaluation,  At surgeon's request and post-op pain management  Laterality: N/A and Left  Prep: chloraprep       Needles:  Injection technique: Single-shot  Needle Type: Echogenic Needle          Additional Needles:   Procedures: ultrasound guided,,,,,,,,  Narrative:  Start time: 08/18/2016 7:18 AM End time: 08/18/2016 7:23 AM Injection made incrementally with aspirations every 5 mL.  Performed by: Personally   Additional Notes: H+P and labs reviewed, risks and benefits discussed with patient, procedure tolerated well without complications

## 2016-08-24 NOTE — Addendum Note (Signed)
Addendum  created 08/24/16 4356 by Oleta Mouse, MD   Anesthesia Intra Blocks edited, Anesthesia Intra Meds edited, Child order released for a procedure order, Sign clinical note

## 2016-08-30 ENCOUNTER — Ambulatory Visit (HOSPITAL_BASED_OUTPATIENT_CLINIC_OR_DEPARTMENT_OTHER): Payer: Medicare Other | Admitting: Hematology and Oncology

## 2016-08-30 ENCOUNTER — Encounter: Payer: Self-pay | Admitting: Hematology and Oncology

## 2016-08-30 DIAGNOSIS — D0512 Intraductal carcinoma in situ of left breast: Secondary | ICD-10-CM | POA: Diagnosis not present

## 2016-08-30 NOTE — Progress Notes (Signed)
Patient Care Team: Velna Hatchet, MD as PCP - General (Internal Medicine)  DIAGNOSIS:  Encounter Diagnosis  Name Primary?  . Ductal carcinoma in situ (DCIS) of left breast     SUMMARY OF ONCOLOGIC HISTORY:   Ductal carcinoma in situ (DCIS) of left breast   09/2012 Initial Diagnosis    Mammographically detected left breast abnormality biopsy-proven high-grade  DCIS ER/PR/ HER-2 negative status post lumpectomy (SLN neg) followed by radiation (in [place])      10/2012 -  Anti-estrogen oral therapy    Tamoxifen 20 mg daily started in [place] (patient wishes to take it in spite of her DCIS being ER/PR negative)      07/26/2016 Relapse/Recurrence    Left breast suspicious 0.9 cm group of linear branching calcifications, biopsy revealed DCIS with calcifications ER 0%, PR 0%      08/22/2016 Surgery    Left simple mastectomy: DCIS with necrosis and calcifications, grade 3, margins negative, 0/1 lymph node negative, ER 0%, PR 0% stage 0       CHIEF COMPLIANT: Follow-up after mastectomy for left breast DCIS  INTERVAL HISTORY: Colleen Hancock is a 71 year old with above-mentioned history of recurrent left breast DCIS who underwent mastectomy and is here to discuss the final pathology report. She is in a lot of discomfortrelated to the mastectomy surgical scar. There is some blood blisters as well as mild oozing around the incision sites.There is mild redness as well. She denies fevers or chills. She has appointments to see surgery next week.  REVIEW OF SYSTEMS:   Constitutional: Denies fevers, chills or abnormal weight loss Eyes: Denies blurriness of vision Ears, nose, mouth, throat, and face: Denies mucositis or sore throat Respiratory: Denies cough, dyspnea or wheezes Cardiovascular: Denies palpitation, chest discomfort Gastrointestinal:  Denies nausea, heartburn or change in bowel habits Skin: Denies abnormal skin rashes Lymphatics: Denies new lymphadenopathy or easy  bruising Neurological:Denies numbness, tingling or new weaknesses Behavioral/Psych: Mood is stable, no new changes  Extremities: No lower extremity edema Breast: left mastectomy All other systems were reviewed with the patient and are negative.  I have reviewed the past medical history, past surgical history, social history and family history with the patient and they are unchanged from previous note.  ALLERGIES:  is allergic to fentanyl; tetracyclines & related; tylagesic [acetaminophen]; other; clams [shellfish allergy]; nsaids; and pentazocine lactate.  MEDICATIONS:  Current Outpatient Prescriptions  Medication Sig Dispense Refill  . albuterol (PROVENTIL HFA;VENTOLIN HFA) 108 (90 BASE) MCG/ACT inhaler Inhale 1 puff into the lungs every 6 (six) hours as needed for wheezing or shortness of breath.     Marland Kitchen atorvastatin (LIPITOR) 20 MG tablet Take 20 mg by mouth daily before breakfast.    . cetirizine (ZYRTEC ALLERGY) 10 MG tablet Take 1 tablet (10 mg total) by mouth daily. (Patient taking differently: Take 10 mg by mouth every evening. )    . cholecalciferol (VITAMIN D) 1000 units tablet Take 1,000 Units by mouth every evening.    . cholestyramine (QUESTRAN) 4 GM/DOSE powder Take 0.75 packets by mouth daily before breakfast. 3/4 scoop (MUST TAKE 1 hour after dicyclomine)    . dicyclomine (BENTYL) 10 MG capsule Take 10 mg by mouth daily at 6 (six) AM. (1st thing in the morning)    . Multiple Vitamins-Minerals (MULTIVITAMIN GUMMIES WOMENS PO) Take 2 tablets by mouth every evening. One-A-Day Women's    . Omega-3 Fatty Acids (FISH OIL PO) Take 720 mg by mouth every evening.    Marland Kitchen  oxyCODONE (OXY IR/ROXICODONE) 5 MG immediate release tablet Take 1-2 tablets (5-10 mg total) by mouth every 4 (four) hours as needed for moderate pain. 30 tablet 0  . Polyethyl Glycol-Propyl Glycol (SYSTANE) 0.4-0.3 % SOLN Place 1 drop into both eyes daily as needed (for dry/irritated eyes.).    Marland Kitchen traMADol (ULTRAM) 50 MG  tablet Take 50 mg by mouth every 6 (six) hours as needed (for pain.).     No current facility-administered medications for this visit.     PHYSICAL EXAMINATION: ECOG PERFORMANCE STATUS: 2 - Symptomatic, <50% confined to bed  Vitals:   08/30/16 1342  BP: (!) 131/44  Pulse: 96  Resp: 17  Temp: 98.3 F (36.8 C)   Filed Weights   08/30/16 1342  Weight: 180 lb (81.6 kg)    GENERAL:alert, no distress and comfortable SKIN: skin color, texture, turgor are normal, no rashes or significant lesions EYES: normal, Conjunctiva are pink and non-injected, sclera clear OROPHARYNX:no exudate, no erythema and lips, buccal mucosa, and tongue normal  NECK: supple, thyroid normal size, non-tender, without nodularity LYMPH:  no palpable lymphadenopathy in the cervical, axillary or inguinal LUNGS: clear to auscultation and percussion with normal breathing effort HEART: regular rate & rhythm and no murmurs and no lower extremity edema ABDOMEN:abdomen soft, non-tender and normal bowel sounds MUSCULOSKELETAL:no cyanosis of digits and no clubbing  NEURO: alert & oriented x 3 with fluent speech, no focal motor/sensory deficits EXTREMITIES: No lower extremity edema BREAST:left mastectomy scar appears to be intact. Mild redness is noted. Slight puffiness in the axilla.  LABORATORY DATA:  I have reviewed the data as listed   Chemistry      Component Value Date/Time   NA 134 (L) 08/19/2016 0625   K 4.2 08/19/2016 0625   CL 105 08/19/2016 0625   CO2 20 (L) 08/19/2016 0625   BUN 12 08/19/2016 0625   CREATININE 0.97 08/19/2016 0625      Component Value Date/Time   CALCIUM 8.0 (L) 08/19/2016 0625   ALKPHOS 53 04/11/2014 1305   AST 36 04/11/2014 1305   ALT 30 04/11/2014 1305   BILITOT 0.5 04/11/2014 1305       Lab Results  Component Value Date   WBC 6.2 08/16/2016   HGB 13.1 08/16/2016   HCT 39.4 08/16/2016   MCV 95.6 08/16/2016   PLT 180 08/16/2016   NEUTROABS 6.4 04/11/2014     ASSESSMENT & PLAN:  Ductal carcinoma in situ (DCIS) of left breast Left breast DCIS ER/PR negative high-grade status post lumpectomy July 2014 currently on adjuvant tamoxifen since September 2014 discontinued May 2018.  Recurrence: 07/25/2016: Suspicious 0.9 cm group of calcifications lower inner quadrant left breast biopsy-proven to be ER/PR negative high-grade DCIS  08/22/2016: Left simple mastectomy: DCIS with necrosis and calcifications, grade 3, margins negative, 0/1 lymph node negative, ER 0%, PR 0% stage 0 ----------------------------------------------------------- Recommendation: No role of antiestrogen therapy Counseled her extensively regarding the final pathology report Slow healing surgical wound: No active infection seen.  Return to clinic annually for breast exams and follow-up with survivorship clinic  I spent 25 minutes talking to the patient of which more than half was spent in counseling and coordination of care.  No orders of the defined types were placed in this encounter.  The patient has a good understanding of the overall plan. she agrees with it. she will call with any problems that may develop before the next visit here.   Rulon Eisenmenger, MD 08/30/16

## 2016-08-30 NOTE — Assessment & Plan Note (Signed)
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 Tamoxifen toxicities: No major side effects to tamoxifen.  Recurrence: 07/25/2016: Suspicious 0.9 cm group of calcifications lower inner quadrant left breast biopsy-proven to be ER/PR negative high-grade DCIS  08/22/2016: Left simple mastectomy: DCIS with necrosis and calcifications, grade 3, margins negative, 0/1 lymph node negative, ER 0%, PR 0% stage 0 ----------------------------------------------------------- Recommendation: No role of antiestrogen therapy  Return to clinic annually for breast exams and follow-up with survivorship clinic

## 2016-09-01 ENCOUNTER — Encounter: Payer: Self-pay | Admitting: Physical Therapy

## 2016-09-01 ENCOUNTER — Ambulatory Visit: Payer: Medicare Other | Attending: General Surgery | Admitting: Physical Therapy

## 2016-09-01 DIAGNOSIS — R293 Abnormal posture: Secondary | ICD-10-CM | POA: Diagnosis not present

## 2016-09-01 DIAGNOSIS — M25612 Stiffness of left shoulder, not elsewhere classified: Secondary | ICD-10-CM

## 2016-09-01 DIAGNOSIS — Z483 Aftercare following surgery for neoplasm: Secondary | ICD-10-CM | POA: Diagnosis not present

## 2016-09-01 NOTE — Patient Instructions (Signed)
Patient was instructed today in a home exercise program today for post op shoulder range of motion. These included active assist shoulder flexion in sitting, scapular retraction, wall walking with shoulder abduction, and hands behind head external rotation.  She was encouraged to do these twice a day, holding 3 seconds and repeating 5 times when permitted by her physician. °  °

## 2016-09-01 NOTE — Therapy (Signed)
[place], [place], [address] Phone: [phone]   Fax:  [phone]  Physical Therapy Evaluation  Patient Details  Name: Colleen Hancock MRN: [id number] Date of Birth: 1945/11/27 Referring Provider: Dr. Rolm Bookbinder  Encounter Date: 09/01/2016      PT End of Session - 09/01/16 1145    Visit Number (P)  1   Number of Visits (P)  8   Date for PT Re-Evaluation (P)  09/29/16   PT Start Time (P)  1102   PT Stop Time (P)  1148   PT Time Calculation (min) (P)  46 min   Activity Tolerance (P)  Patient tolerated treatment well   Behavior During Therapy (P)  WFL for tasks assessed/performed      Past Medical History:  Diagnosis Date  . Anxiety   . Arthritis    hands, knees  . Asthma    borderine  . Breast CA ([place]) 10/2012   Left Breast  . Complication of anesthesia    during colonoscopy fentanyl dropped blood pressure  . Dyspnea    exertion  . GERD (gastroesophageal reflux disease)   . Heart murmur   . High cholesterol   . IBS (irritable bowel syndrome)     Past Surgical History:  Procedure Laterality Date  . BREAST BIOPSY Left 10/2012   Stereo- Malignant  . BREAST BIOPSY Left    U/S Core- Benign  . BREAST EXCISIONAL BIOPSY Left   . BREAST EXCISIONAL BIOPSY Left   . BREAST LUMPECTOMY Left 11/2012  . CHOLECYSTECTOMY    . COLONOSCOPY    . DILATION AND CURETTAGE OF UTERUS  1967  . MASTECTOMY W/ SENTINEL NODE BIOPSY Left 08/18/2016  . MASTECTOMY W/ SENTINEL NODE BIOPSY Left 08/18/2016   Procedure: LEFT TOTAL MASTECTOMY WITH SENTINEL LYMPH NODE BIOPSY WITH BLUE DYE INJECTION;  Surgeon: Rolm Bookbinder, MD;  Location: [facility];  Service: General;  Laterality: Left;  . SKIN CANCER EXCISION Left    eye brow    There were no vitals filed for this visit.       Subjective Assessment - 09/01/16 1110    Subjective Patient had a left mastectomy and sentinel node biopsy 08/18/16 for a DCIS  recurrence. No chemo or radiation needed. Drain removed 08/24/16.   Patient is accompained by: Family member   Pertinent History Left lumpectomy with SNLB for DCIS 8/14. Recurrence 6/18. IBS since 1980.   Patient Stated Goals Move arm normally   Currently in Pain? Yes   Pain Score 3    Pain Location Chest   Pain Orientation Left   Pain Descriptors / Indicators Sore  Stings   Pain Type Surgical pain   Pain Onset 1 to 4 weeks ago   Pain Frequency Intermittent   Aggravating Factors  Moving arm; riding in car with bumps   Pain Relieving Factors Rest            [facility] PT Assessment - 09/01/16 0001      Assessment   Medical Diagnosis s/p left mastectomy with SNLB   Referring Provider Dr. Rolm Bookbinder   Onset Date/Surgical Date 08/18/16   Hand Dominance Right   Prior Therapy none     Precautions   Precautions Other (comment)   Precaution Comments recent surgery; left arm lymphedema risk     Restrictions   Weight Bearing Restrictions No     Balance Screen   Has the patient fallen in the past 6 months No  Has the patient had a decrease in activity level because of a fear of falling?  No   Is the patient reluctant to leave their home because of a fear of falling?  No     Home Environment   Living Environment Private residence   Living Arrangements Spouse/significant other;Children  Husband and daughter   Available Help at Discharge Family     Prior Function   Level of [place] Retired   Leisure She does not exercise     Cognition   Overall Cognitive Status Within Functional Limits for tasks assessed     Posture/Postural Control   Posture/Postural Control Postural limitations   Postural Limitations Rounded Shoulders;Forward head     ROM / Strength   AROM / PROM / Strength AROM     AROM   AROM Assessment Site Shoulder   Right/Left Shoulder Right;Left   Right Shoulder Extension 58 Degrees   Right Shoulder Flexion 143 Degrees    Right Shoulder ABduction 144 Degrees   Right Shoulder Internal Rotation 83 Degrees   Right Shoulder External Rotation 70 Degrees   Left Shoulder Extension 36 Degrees   Left Shoulder Flexion 85 Degrees   Left Shoulder ABduction 82 Degrees   Left Shoulder Internal Rotation --  not tested due to abduction < 90 degrees   Left Shoulder External Rotation --  not tested due to abduction < 90 degrees     Palpation   Palpation comment Chest incision appears to be healing well; it is a large Y incision due to previous radiation with steristrips present.           LYMPHEDEMA/ONCOLOGY QUESTIONNAIRE - 09/01/16 1126      Type   Cancer Type Left breast cancer     Surgeries   Mastectomy Date 08/18/16   Lumpectomy Date 10/05/12   Sentinel Lymph Node Biopsy Date 08/18/16   Number Lymph Nodes Removed 1     Treatment   Active Chemotherapy Treatment No   Past Chemotherapy Treatment No   Active Radiation Treatment No   Past Radiation Treatment Yes   Date 01/24/13   Body Site left breast   Current Hormone Treatment No   Past Hormone Therapy Yes   Drug Name tamoxifen     What other symptoms do you have   Are you Having Heaviness or Tightness No   Are you having Pain Yes   Are you having pitting edema No   Is it Hard or Difficult finding clothes that fit No   Do you have infections No   Is there Decreased scar mobility No   Stemmer Sign No     Lymphedema Stage   Stage --  Baseline measurements; no sign of edema     Lymphedema Assessments   Lymphedema Assessments Upper extremities     Right Upper Extremity Lymphedema   15 cm Proximal to Olecranon Process 35.2 cm   10 cm Proximal to Olecranon Process 32.4 cm   Olecranon Process 28 cm   15 cm Proximal to Ulnar Styloid Process 28.1 cm   10 cm Proximal to Ulnar Styloid Process 25.6 cm   Just Proximal to Ulnar Styloid Process 18.7 cm   Across Hand at [employer] 19.1 cm   At [place] of 2nd Digit 7 cm     Left Upper Extremity  Lymphedema   15 cm Proximal to Olecranon Process 35 cm   10 cm Proximal to Olecranon Process 33.3 cm   Olecranon Process  27.7 cm   15 cm Proximal to Ulnar Styloid Process 27.6 cm   10 cm Proximal to Ulnar Styloid Process 24.8 cm   Just Proximal to Ulnar Styloid Process 18.5 cm   Across Hand at [employer] 19.5 cm   At [place] of 2nd Digit 7.1 cm         Objective measurements completed on examination: See above findings.                  PT Education - 09/01/16 1145    Education provided Yes   Education Details Post op shoulder ROM HEP   Person(s) Educated Patient;Spouse   Methods Explanation;Demonstration;Handout   Comprehension Returned demonstration;Verbalized understanding                Long Term Clinic Goals - 09/01/16 1237      CC Long Term Goal  #1   Title Patient will verbalize understanding of lymphedema risk reduction practices.   Time 4   Period Weeks   Status New     CC Long Term Goal  #2   Title Increase left shoulder active flexion to >/= 120 degrees to increased ease reaching and performing daily tasks.   Time 4   Period Weeks   Status New     CC Long Term Goal  #3   Title Increase left shoulder active abduction to >/= 120 degrees to increased ease reaching and performing daily tasks.   Time 4   Period Weeks   Status New     CC Long Term Goal  #4   Title Patient will report she has returned to >/= 50% of normal household chores including cooking and cleaning.   Time 4   Period Weeks   Status New             Plan - 09/01/16 1234    Clinical Impression Statement Patient is a pleasant 71 y.o. woman s/p left mastectomy on 08/18/16 for recurrence of DCIS. She is doing quite well for 2 weeks post op. Her incision is large and Y-shaped due to previous radiation to that area. Shoulder ROM is very limited but as expected for 2 weeks post op. Drains were removed last week. She will benefit from PT to improve shoulder ROM and  return to prior functional level.   History and Personal Factors relevant to plan of care: none   Clinical Presentation Stable   Clinical Presentation due to: Condition is stable   Clinical Decision Making Low   Rehab Potential Excellent   Clinical Impairments Affecting Rehab Potential none   PT Frequency 2x / week   PT Duration 4 weeks   PT Treatment/Interventions ADLs/Self Care Home Management;Patient/family education;Manual techniques;Passive range of motion;Therapeutic exercise;Therapeutic activities   PT Next Visit Plan Review HEP; begin gentle PROM, pulleys   PT Home Exercise Plan Shoulder ROM   Consulted and Agree with Plan of Care Patient;Family member/caregiver   Family Member Consulted Husband      Patient will benefit from skilled therapeutic intervention in order to improve the following deficits and impairments:  Decreased skin integrity, Decreased scar mobility, Decreased knowledge of precautions, Pain, Impaired UE functional use, Increased fascial restricitons, Decreased strength, Decreased range of motion, Postural dysfunction  Visit Diagnosis: Aftercare following surgery for neoplasm - Plan: PT plan of care cert/re-cert  Stiffness of left shoulder, not elsewhere classified - Plan: PT plan of care cert/re-cert  Abnormal posture - Plan: PT plan of care cert/re-cert  G-Codes - 09/01/16 1238    Functional Assessment Tool Used (Outpatient Only) Clinical Judgement   Functional Limitation Carrying, moving and handling objects   Carrying, Moving and Handling Objects Current Status [phone]) At least 60 percent but less than 80 percent impaired, limited or restricted   Carrying, Moving and Handling Objects Goal Status ([id number]) At least 1 percent but less than 20 percent impaired, limited or restricted       Problem List Patient Active Problem List   Diagnosis Date Noted  . Breast neoplasm, Tis (DCIS), 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  . Asthma 01/26/2016  . PNA (pneumonia)  04/11/2014  . Hyperlipemia 04/11/2014  . 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   Colleen Hancock, PT 09/01/16 12:41 PM  [place] H[place], [place], [address] Phone: [phone]   Fax:  [phone]  Name: Colleen Hancock MRN: [id number] Date of Birth: 11/16/1945

## 2016-09-13 ENCOUNTER — Ambulatory Visit: Payer: Medicare Other | Attending: General Surgery | Admitting: Physical Therapy

## 2016-09-13 DIAGNOSIS — M6281 Muscle weakness (generalized): Secondary | ICD-10-CM | POA: Diagnosis not present

## 2016-09-13 DIAGNOSIS — Z483 Aftercare following surgery for neoplasm: Secondary | ICD-10-CM | POA: Diagnosis not present

## 2016-09-13 DIAGNOSIS — M25612 Stiffness of left shoulder, not elsewhere classified: Secondary | ICD-10-CM | POA: Diagnosis not present

## 2016-09-13 DIAGNOSIS — R293 Abnormal posture: Secondary | ICD-10-CM | POA: Diagnosis not present

## 2016-09-13 NOTE — Therapy (Signed)
[place], [place], [address] Phone: [phone]   Fax:  [phone]  Physical Therapy Treatment  Patient Details  Name: Colleen Hancock MRN: [id number] Date of Birth: 21-Jan-1946 Referring Provider: Dr. Rolm Bookbinder  Encounter Date: 09/13/2016      PT End of Session - 09/13/16 1211    Visit Number 2   Number of Visits 8   Date for PT Re-Evaluation 09/29/16   PT Start Time 1104   PT Stop Time 1145   PT Time Calculation (min) 41 min   Activity Tolerance Treatment limited secondary to medical complications (Comment)   Behavior During Therapy [facility] for tasks assessed/performed           Past Medical History:  Diagnosis Date  . Anxiety   . Arthritis    hands, knees  . Asthma    borderine  . Breast CA ([place]) 10/2012   Left Breast  . Complication of anesthesia    during colonoscopy fentanyl dropped blood pressure  . Dyspnea    exertion  . GERD (gastroesophageal reflux disease)   . Heart murmur   . High cholesterol   . IBS (irritable bowel syndrome)     Past Surgical History:  Procedure Laterality Date  . BREAST BIOPSY Left 10/2012   Stereo- Malignant  . BREAST BIOPSY Left    U/S Core- Benign  . BREAST EXCISIONAL BIOPSY Left   . BREAST EXCISIONAL BIOPSY Left   . BREAST LUMPECTOMY Left 11/2012  . CHOLECYSTECTOMY    . COLONOSCOPY    . DILATION AND CURETTAGE OF UTERUS  1967  . MASTECTOMY W/ SENTINEL NODE BIOPSY Left 08/18/2016  . MASTECTOMY W/ SENTINEL NODE BIOPSY Left 08/18/2016   Procedure: LEFT TOTAL MASTECTOMY WITH SENTINEL LYMPH NODE BIOPSY WITH BLUE DYE INJECTION;  Surgeon: Rolm Bookbinder, MD;  Location: [facility];  Service: General;  Laterality: Left;  . SKIN CANCER EXCISION Left    eye brow    There were no vitals filed for this visit.      Subjective Assessment - 09/13/16 1156    Subjective Pt reports her compression bra is uncomfortable on her ribs She doesn't wear  it all the time, but comes in with it on today.  She says it constantly gets pulled to the right side by her intact breast.  Puffy area at anterior axilla is exacerbated because the bra ends just below it.  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  Recurrence 6/18  with left mastectomy and sentinel node bioplsy on 08/18/2016. She says the doctor said her had to "scrape my ribs".  No chemo or radiation is needed at this time  IBS since 1980.   Patient Stated Goals Move arm normally   Currently in Pain? Yes   Pain Score --  did not rate    Pain Location Rib cage   Pain Orientation Left   Pain Type Surgical pain   Pain Onset 1 to 4 weeks ago   Pain Frequency Intermittent   Aggravating Factors  moving arm    Pain Relieving Factors rest            [facility] PT Assessment - 09/13/16 0001      Observation/Other Assessments   Skin Integrity pt has 6cm open area at medial portion of incision with thick yellow  eschar . There is positive odor and drainage with redness around incision. Steristrips and stitches intact.  Pt advised to go to see doctor today, though she has an appointment with Dr. Donne Hazel tomorrow morning.  Call made to [facility] to let them know pt was on her way there.                      [facility] Adult PT Treatment/Exercise - 09/13/16 0001      Self-Care   Other Self-Care Comments  talked about compression bra that seems to be irritating anterior chest and ribs, drainage visible on inside of bra.  Adjusted hooks on left shoulder to bring bra up to provide more compression to anterior axilla. Pt took out insert that she feel was applying compression to incision.      Knee/Hip Exercises: Supine   Other Supine Knee/Hip Exercises leg lengthener with each leg limited by cramping in low back      Shoulder Exercises: Supine    Other Supine Exercises left arm to ceiling , pt with small circles in each direction limited by cramping in right abdomen and side.      Manual Therapy   Manual Lymphatic Drainage (MLD) brief stationary circles to left anterior chest and upper arm    Passive ROM in supine, PROM to left shoulder in external rotation, flexion and abdution within pain limits and also watcjhing anterior chest so as not to pull on opening [facility] Goals - 09/01/16 1237      CC Long Term Goal  #1   Title Patient will verbalize understanding of lymphedema risk reduction practices.   Time 4   Period Weeks   Status New     CC Long Term Goal  #2   Title Increase left shoulder active flexion to >/= 120 degrees to increased ease reaching and performing daily tasks.   Time 4   Period Weeks   Status New     CC Long Term Goal  #3   Title Increase left shoulder active abduction to >/= 120 degrees to increased ease reaching and performing daily tasks.   Time 4   Period Weeks   Status New     CC Long Term Goal  #4   Title Patient will report she has returned to >/= 50% of normal household chores including cooking and cleaning.   Time 4   Period Weeks   Status New            Plan - 09/13/16 1212    Clinical Impression Statement Pt was surprised to see that she has an opening in her incision .  She says that it had been draining  but she did not realize it was open.  She says she checks her temperature every day and she has not had a fever except for one day early on.    Rehab Potential Excellent   Clinical Impairments Affecting Rehab Potential none   PT Frequency 2x / week   PT Duration 4 weeks   PT Next Visit Plan Check incision and results of surgeon visit,  fit of compression bra and  puffy area at anterior axilla consider giving her a white foam pad for compression to this area.  Progress shoulder ROM as able and tolerated    Consulted and Agree  with Plan of Care Patient      Patient will benefit from skilled therapeutic intervention in order to improve the following deficits and impairments:  Decreased skin integrity, Decreased scar mobility, Decreased knowledge of precautions, Pain, Impaired UE functional use, Increased fascial restricitons, Decreased strength, Decreased range of motion, Postural dysfunction  Visit Diagnosis: Aftercare following surgery for neoplasm  Stiffness of left shoulder, not elsewhere classified  Abnormal posture     Problem List Patient Active Problem List   Diagnosis Date Noted  . Breast neoplasm, Tis (DCIS), 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  . Asthma 01/26/2016  . PNA (pneumonia) 04/11/2014  . Hyperlipemia 04/11/2014  . 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   Donato Heinz. Owens Shark PT  [name] 09/13/2016, 12:32 PM  [place], [place], [address] Phone: [phone]   Fax:  [phone]  Name: Colleen Hancock MRN: [id number] Date of Birth: January 01, 1946

## 2016-09-15 ENCOUNTER — Ambulatory Visit: Payer: Medicare Other | Admitting: Physical Therapy

## 2016-09-15 DIAGNOSIS — M6281 Muscle weakness (generalized): Secondary | ICD-10-CM | POA: Diagnosis not present

## 2016-09-15 DIAGNOSIS — Z483 Aftercare following surgery for neoplasm: Secondary | ICD-10-CM | POA: Diagnosis not present

## 2016-09-15 DIAGNOSIS — R293 Abnormal posture: Secondary | ICD-10-CM | POA: Diagnosis not present

## 2016-09-15 DIAGNOSIS — M25612 Stiffness of left shoulder, not elsewhere classified: Secondary | ICD-10-CM

## 2016-09-15 NOTE — Therapy (Signed)
[place], [place], [address] Phone: [phone]   Fax:  [phone]  Physical Therapy Treatment  Patient Details  Name: Colleen Hancock MRN: [id number] Date of Birth: 10-16-1945 Referring Provider: Dr. Rolm Bookbinder  Encounter Date: 09/15/2016      PT End of Session - 09/15/16 1210    Visit Number 3   Number of Visits 8   Date for PT Re-Evaluation 09/29/16   PT Start Time 1100   PT Stop Time 1143   PT Time Calculation (min) 43 min   Activity Tolerance Patient tolerated treatment well   Behavior During Therapy [facility] for tasks assessed/performed      Past Medical History:  Diagnosis Date  . Anxiety   . Arthritis    hands, knees  . Asthma    borderine  . Breast CA ([place]) 10/2012   Left Breast  . Complication of anesthesia    during colonoscopy fentanyl dropped blood pressure  . Dyspnea    exertion  . GERD (gastroesophageal reflux disease)   . Heart murmur   . High cholesterol   . IBS (irritable bowel syndrome)     Past Surgical History:  Procedure Laterality Date  . BREAST BIOPSY Left 10/2012   Stereo- Malignant  . BREAST BIOPSY Left    U/S Core- Benign  . BREAST EXCISIONAL BIOPSY Left   . BREAST EXCISIONAL BIOPSY Left   . BREAST LUMPECTOMY Left 11/2012  . CHOLECYSTECTOMY    . COLONOSCOPY    . DILATION AND CURETTAGE OF UTERUS  1967  . MASTECTOMY W/ SENTINEL NODE BIOPSY Left 08/18/2016  . MASTECTOMY W/ SENTINEL NODE BIOPSY Left 08/18/2016   Procedure: LEFT TOTAL MASTECTOMY WITH SENTINEL LYMPH NODE BIOPSY WITH BLUE DYE INJECTION;  Surgeon: Rolm Bookbinder, MD;  Location: [place];  Service: General;  Laterality: Left;  . SKIN CANCER EXCISION Left    eye brow    There were no vitals filed for this visit.      Subjective Assessment - 09/15/16 1108    Subjective Pt went to see Dr. Marlou Starks after her last visit her and had a scheduled visit with Dr. Donne Hazel yesterday.  She is now  using dry gauze 4x4 and showering every day with dial soap.    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  Recurrence 6/18  with left mastectomy and sentinel node bioplsy on 08/18/2016. She says the doctor said her had to "scrape my ribs".  No chemo or radiation is needed at this time  IBS since 1980.   Patient Stated Goals Move arm normally   Currently in Pain? Yes   Pain Score 2    Pain Location Chest  at incision    Pain Descriptors / Indicators Aching   Pain Type Surgical pain   Pain Onset 1 to 4 weeks ago                         [facility] Adult PT Treatment/Exercise - 09/15/16 0001      Self-Care   Other Self-Care Comments  inspected wound. which has had eschar trimmed. she still has some eschar around the edges but pink granulation tissue is visible at wound base.  Reviewed skin care and replaced dry gauze      Knee/Hip Exercises: Standing   Other Standing Knee Exercises sit to stand with glute sets at the top x 5 reps  Knee/Hip Exercises: Supine   Bridges Limitations 5 bridges with pt having cramping in hamstrings on last rep    Other Supine Knee/Hip Exercises lower trunk rotation in mid range      Shoulder Exercises: Supine   Other Supine Exercises supine dowel rod exercises for shoulder flexion and for chest press with getting elbows out to side     Manual Therapy   Passive ROM in supine, PROM in shoulder flexion, rotation, abduction and diagonal planes   used deep breathing to augment stretch                         Long Term Clinic Goals - 09/01/16 1237      CC Long Term Goal  #1   Title Patient will verbalize understanding of lymphedema risk reduction practices.   Time 4   Period Weeks   Status New     CC Long Term Goal  #2   Title Increase left shoulder active flexion to >/= 120 degrees to increased ease reaching and performing daily tasks.   Time 4   Period Weeks    Status New     CC Long Term Goal  #3   Title Increase left shoulder active abduction to >/= 120 degrees to increased ease reaching and performing daily tasks.   Time 4   Period Weeks   Status New     CC Long Term Goal  #4   Title Patient will report she has returned to >/= 50% of normal household chores including cooking and cleaning.   Time 4   Period Weeks   Status New            Plan - 09/15/16 1210    Clinical Impression Statement Pt appears to be better today.  She has dry dressing on her chest and wound is clean with no odor.  Granulation tissue is visible.  She tolerated Actve Assisted and passive exercise to left shoulder with some "pulling" in left axilla and lateral chest.  Used deep breathing to augment stretch and provide some rib mobility  Activated LE as well to influence core and upper quarter.    Rehab Potential Excellent   Clinical Impairments Affecting Rehab Potential delayed healing on chest wound    PT Frequency 2x / week   PT Duration 4 weeks   PT Treatment/Interventions ADLs/Self Care Home Management;Patient/family education;Manual techniques;Passive range of motion;Therapeutic exercise;Therapeutic activities   PT Next Visit Plan Check incision and results of surgeon visit,  fit of compression bra and  puffy area at anterior axilla consider giving her a white foam pad for compression to this area.  Progress shoulder ROM as able and tolerated    Consulted and Agree with Plan of Care Patient      Patient will benefit from skilled therapeutic intervention in order to improve the following deficits and impairments:  Decreased skin integrity, Decreased scar mobility, Decreased knowledge of precautions, Pain, Impaired UE functional use, Increased fascial restricitons, Decreased strength, Decreased range of motion, Postural dysfunction  Visit Diagnosis: Aftercare following surgery for neoplasm  Stiffness of left shoulder, not elsewhere classified  Abnormal  posture     Problem List Patient Active Problem List   Diagnosis Date Noted  . Breast neoplasm, Tis (DCIS), 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  . Asthma 01/26/2016  . PNA (pneumonia) 04/11/2014  . Hyperlipemia 04/11/2014  . 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   Donato Heinz. Owens Shark PT  Maudry Diego  Randell Patient 09/15/2016, 12:14 PM  [place], [place], [address] Phone: [phone]   Fax:  [phone]  Name: Colleen Hancock MRN: [id number] Date of Birth: 01-18-1946

## 2016-09-20 ENCOUNTER — Ambulatory Visit: Payer: Medicare Other | Admitting: Physical Therapy

## 2016-09-20 DIAGNOSIS — Z483 Aftercare following surgery for neoplasm: Secondary | ICD-10-CM | POA: Diagnosis not present

## 2016-09-20 DIAGNOSIS — R293 Abnormal posture: Secondary | ICD-10-CM

## 2016-09-20 DIAGNOSIS — M25612 Stiffness of left shoulder, not elsewhere classified: Secondary | ICD-10-CM

## 2016-09-20 DIAGNOSIS — M6281 Muscle weakness (generalized): Secondary | ICD-10-CM | POA: Diagnosis not present

## 2016-09-20 NOTE — Patient Instructions (Signed)
SHOULDER: Flexion - Supine (Cane)        Cancer Rehab [phone]    Hold cane in both hands. Raise arms up overhead. Do not allow back to arch. Hold _5__ seconds. Do __5-10__ times; __1-2__ times a day.   SELF ASSISTED WITH OBJECT: Shoulder Abduction / Adduction - Supine    Hold cane with both hands. Move both arms from side to side, keep elbows straight.  Hold when stretch felt for __5__ seconds. Repeat __5-10__ times; __1-2__ times a day. Once this becomes easier progress to third picture bringing affected arm towards ear by staying out to side. Same hold for _5_seconds. Repeat  _5-10_ times, _1-2_ times/day.  Shoulder Blade Stretch    Clasp fingers behind head with elbows touching in front of face. Pull elbows back while pressing shoulder blades together. Relax and hold as tolerated, can place pillow under elbow here for comfort as needed and to allow for prolonged stretch.  Repeat __5__ times. Do __1-2__ sessions per day.      Copyright  VHI. All rights reserved.       

## 2016-09-20 NOTE — Therapy (Signed)
[place], [place], [address] Phone: [phone]   Fax:  [phone]  Physical Therapy Treatment  Patient Details  Name: Colleen Hancock MRN: [id number] Date of Birth: 1945-03-11 Referring Provider: Dr. Rolm Bookbinder  Encounter Date: 09/20/2016      PT End of Session - 09/20/16 1452    Visit Number 4   Number of Visits 8   Date for PT Re-Evaluation 09/29/16   PT Start Time 1105   PT Stop Time 1145   PT Time Calculation (min) 40 min   Activity Tolerance Patient tolerated treatment well   Behavior During Therapy [facility] for tasks assessed/performed      Past Medical History:  Diagnosis Date  . Anxiety   . Arthritis    hands, knees  . Asthma    borderine  . Breast CA ([place]) 10/2012   Left Breast  . Complication of anesthesia    during colonoscopy fentanyl dropped blood pressure  . Dyspnea    exertion  . GERD (gastroesophageal reflux disease)   . Heart murmur   . High cholesterol   . IBS (irritable bowel syndrome)     Past Surgical History:  Procedure Laterality Date  . BREAST BIOPSY Left 10/2012   Stereo- Malignant  . BREAST BIOPSY Left    U/S Core- Benign  . BREAST EXCISIONAL BIOPSY Left   . BREAST EXCISIONAL BIOPSY Left   . BREAST LUMPECTOMY Left 11/2012  . CHOLECYSTECTOMY    . COLONOSCOPY    . DILATION AND CURETTAGE OF UTERUS  1967  . MASTECTOMY W/ SENTINEL NODE BIOPSY Left 08/18/2016  . MASTECTOMY W/ SENTINEL NODE BIOPSY Left 08/18/2016   Procedure: LEFT TOTAL MASTECTOMY WITH SENTINEL LYMPH NODE BIOPSY WITH BLUE DYE INJECTION;  Surgeon: Rolm Bookbinder, MD;  Location: [place];  Service: General;  Laterality: Left;  . SKIN CANCER EXCISION Left    eye brow    There were no vitals filed for this visit.      Subjective Assessment - 09/20/16 1152    Subjective 'I'm awful tired"  Pt has been taking care of husband who is now on antibiotics for a nonhealing wound after a toe  amuputation. He is getting is care at the [facility] wound care center    Pertinent History Left lumpectomy with SNLB for DCIS 8/14 with 36 radiation treatments at that time.  Recurrence 6/18  with left mastectomy and sentinel node bioplsy on 08/18/2016. She says the doctor said her had to "scrape my ribs".  No chemo or radiation is needed at this time  IBS since 1980.   Patient Stated Goals Move arm normally   Currently in Pain? No/denies  "Its not really pain.  Pt feels that she "Got hit " at medial border of incision             [facility] PT Assessment - 09/20/16 0001      Observation/Other Assessments   Skin Integrity wound checked.  Pink granulation tissue at wound base with yellow eshcar scattered over wound that is thick around edges.  Replaces dry gauze dressing      AROM   Left Shoulder Flexion 140 Degrees   Left Shoulder ABduction 100 Degrees                     [facility] Adult PT Treatment/Exercise - 09/20/16 0001      Self-Care   Other Self-Care Comments  thick white foam patch placed inside tshirt at anterior  axilla      Knee/Hip Exercises: Supine   Other Supine Knee/Hip Exercises lower trunk rotation in mid range      Shoulder Exercises: Supine   Other Supine Exercises supine dowel rod in flexion and abduction with with stretch with deep breaths at the top    Other Supine Exercises hands behind head  stretching elbows out to side and maintained stretch with folded pillow under left elbow for support                         Long Term Clinic Goals - 09/20/16 1702      CC Long Term Goal  #1   Title Patient will verbalize understanding of lymphedema risk reduction practices.   Time 4   Period Weeks   Status On-going     CC Long Term Goal  #2   Title Increase left shoulder active flexion to >/= 120 degrees to increased ease reaching and performing daily tasks.   Status Achieved     CC Long Term Goal  #3   Title Increase left shoulder active  abduction to >/= 120 degrees to increased ease reaching and performing daily tasks.   Time 4   Period Weeks   Status On-going     CC Long Term Goal  #4   Title Patient will report she has returned to >/= 50% of normal household chores including cooking and cleaning.   Time 4   Period Weeks   Status On-going            Plan - 09/20/16 1452    Clinical Impression Statement pt continues to make improvement.  Her range of motion is much improved, but she continues to have open wound and complains of pain with stretching especially at ribs.  Reinforced home program with dowel rod today and feel that she will improve with this as she was able to tolerate more stretch with repetition while she was here.    Rehab Potential Excellent   Clinical Impairments Affecting Rehab Potential delayed healing on chest wound    PT Frequency 2x / week   PT Duration 4 weeks      Patient will benefit from skilled therapeutic intervention in order to improve the following deficits and impairments:  Decreased skin integrity, Decreased scar mobility, Decreased knowledge of precautions, Pain, Impaired UE functional use, Increased fascial restricitons, Decreased strength, Decreased range of motion, Postural dysfunction  Visit Diagnosis: Aftercare following surgery for neoplasm  Stiffness of left shoulder, not elsewhere classified  Abnormal posture     Problem List Patient Active Problem List   Diagnosis Date Noted  . Breast neoplasm, Tis (DCIS), 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  . Asthma 01/26/2016  . PNA (pneumonia) 04/11/2014  . Hyperlipemia 04/11/2014  . 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   Donato Heinz. Owens Shark PT  [name] 09/20/2016, 5:04 PM  [place], [place], [address] Phone: [phone]   Fax:  [phone]  Name: Colleen Hancock MRN: [id number] Date of Birth: 1945/12/13

## 2016-09-21 ENCOUNTER — Ambulatory Visit: Payer: Medicare Other | Admitting: Physical Therapy

## 2016-09-21 DIAGNOSIS — Z483 Aftercare following surgery for neoplasm: Secondary | ICD-10-CM | POA: Diagnosis not present

## 2016-09-21 DIAGNOSIS — R293 Abnormal posture: Secondary | ICD-10-CM | POA: Diagnosis not present

## 2016-09-21 DIAGNOSIS — M25612 Stiffness of left shoulder, not elsewhere classified: Secondary | ICD-10-CM | POA: Diagnosis not present

## 2016-09-21 DIAGNOSIS — M6281 Muscle weakness (generalized): Secondary | ICD-10-CM | POA: Diagnosis not present

## 2016-09-21 NOTE — Therapy (Signed)
[facility] [place], [place], [address] Phone: [phone]   Fax:  [phone]  Physical Therapy Treatment  Patient Details  Name: Colleen Hancock MRN: [id number] Date of Birth: [birth year] Referring Provider: Dr. Rolm Bookbinder  Encounter Date: 09/21/2016      PT End of Session - 09/21/16 1200    Visit Number 5   Number of Visits 8   Date for PT Re-Evaluation 09/29/16   PT Start Time 1107   PT Stop Time 1148   PT Time Calculation (min) 41 min   Activity Tolerance Patient tolerated treatment well   Behavior During Therapy [facility] for tasks assessed/performed      Past Medical History:  Diagnosis Date  . Anxiety   . Arthritis    hands, knees  . Asthma    borderine  . Breast CA ([place]) 10/2012   Left Breast  . Complication of anesthesia    during colonoscopy fentanyl dropped blood pressure  . Dyspnea    exertion  . GERD (gastroesophageal reflux disease)   . Heart murmur   . High cholesterol   . IBS (irritable bowel syndrome)     Past Surgical History:  Procedure Laterality Date  . BREAST BIOPSY Left 10/2012   Stereo- Malignant  . BREAST BIOPSY Left    U/S Core- Benign  . BREAST EXCISIONAL BIOPSY Left   . BREAST EXCISIONAL BIOPSY Left   . BREAST LUMPECTOMY Left 11/2012  . CHOLECYSTECTOMY    . COLONOSCOPY    . DILATION AND CURETTAGE OF UTERUS  1967  . MASTECTOMY W/ SENTINEL NODE BIOPSY Left 08/18/2016  . MASTECTOMY W/ SENTINEL NODE BIOPSY Left 08/18/2016   Procedure: LEFT TOTAL MASTECTOMY WITH SENTINEL LYMPH NODE BIOPSY WITH BLUE DYE INJECTION;  Surgeon: Rolm Bookbinder, MD;  Location: [place];  Service: General;  Laterality: Left;  . SKIN CANCER EXCISION Left    eye brow    There were no vitals filed for this visit.      Subjective Assessment - 09/21/16 1109    Subjective Colleen Hancock gave me a good workout yesterday and I'm sore, especially in the ribs where I'm having so much trouble with  them.   Currently in Pain? Yes   Pain Score 3    Pain Location Rib cage   Pain Orientation Left   Pain Descriptors / Indicators Sore   Aggravating Factors  moving around   Pain Relieving Factors keepint still            [facility] PT Assessment - 09/21/16 0001      Observation/Other Assessments   Skin Integrity checked wound area and reapplied bandages                     [facility] Adult PT Treatment/Exercise - 09/21/16 0001      Knee/Hip Exercises: Supine   Other Supine Knee/Hip Exercises lower trunk rotation iwth arms outstretched, knees going to right to point of gentle stretch     Shoulder Exercises: Seated   Other Seated Exercises backward shoulder rolls at end of session     Manual Therapy   Manual Therapy Soft tissue mobilization;Myofascial release;Scapular mobilization   Soft tissue mobilization very gently to lateral aspect of mastectomy incision (away from wound)   Myofascial Release gentle left UE myofascial pulling   Scapular Mobilization to left scapula in right sidelying with movement into protraction at different angles and into depression   Passive ROM in supine to left shoulder into er,  abduction, and flexion; in right sidelying into flexion and abduction                        Long Term Clinic Goals - 09/20/16 1702      CC Long Term Goal  #1   Title Patient will verbalize understanding of lymphedema risk reduction practices.   Time 4   Period Weeks   Status On-going     CC Long Term Goal  #2   Title Increase left shoulder active flexion to >/= 120 degrees to increased ease reaching and performing daily tasks.   Status Achieved     CC Long Term Goal  #3   Title Increase left shoulder active abduction to >/= 120 degrees to increased ease reaching and performing daily tasks.   Time 4   Period Weeks   Status On-going     CC Long Term Goal  #4   Title Patient will report she has returned to >/= 50% of normal household chores  including cooking and cleaning.   Time 4   Period Weeks   Status On-going            Plan - 09/21/16 1202    Clinical Impression Statement Pt. tolerated treatment well, but remains limited by healing incision and pain at left ribs. Pt. plans to get some kind of camisole to help hold pads against her skin for mild compression.   Rehab Potential Excellent   Clinical Impairments Affecting Rehab Potential delayed healing on chest wound    PT Frequency 2x / week   PT Duration 4 weeks   PT Treatment/Interventions ADLs/Self Care Home Management;Patient/family education;Manual techniques;Passive range of motion;Therapeutic exercise;Therapeutic activities   PT Next Visit Plan continue with shoulder ROM as tolerated with caution around healing incision   PT Home Exercise Plan Shoulder ROM; add lower trunk rotation   Consulted and Agree with Plan of Care Patient      Patient will benefit from skilled therapeutic intervention in order to improve the following deficits and impairments:  Decreased skin integrity, Decreased scar mobility, Decreased knowledge of precautions, Pain, Impaired UE functional use, Increased fascial restricitons, Decreased strength, Decreased range of motion, Postural dysfunction  Visit Diagnosis: Aftercare following surgery for neoplasm  Stiffness of left shoulder, not elsewhere classified  Abnormal posture     Problem List Patient Active Problem List   Diagnosis Date Noted  . Breast neoplasm, Tis (DCIS), 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  . Asthma 01/26/2016  . PNA (pneumonia) 04/11/2014  . Hyperlipemia 04/11/2014  . 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    SALISBURY,DONNA 09/21/2016, 12:06 PM  [place], [place], [address] Phone: [phone]   Fax:  [phone]  Name: Colleen Hancock MRN: [id number] Date of Birth: Feb 07, 1946  Serafina Royals, PT 09/21/16 12:06 PM

## 2016-09-26 ENCOUNTER — Encounter: Payer: Medicare Other | Admitting: Physical Therapy

## 2016-09-27 ENCOUNTER — Encounter: Payer: Self-pay | Admitting: Physical Therapy

## 2016-09-27 ENCOUNTER — Ambulatory Visit: Payer: Medicare Other | Admitting: Physical Therapy

## 2016-09-27 DIAGNOSIS — M25612 Stiffness of left shoulder, not elsewhere classified: Secondary | ICD-10-CM

## 2016-09-27 DIAGNOSIS — R293 Abnormal posture: Secondary | ICD-10-CM

## 2016-09-27 DIAGNOSIS — Z483 Aftercare following surgery for neoplasm: Secondary | ICD-10-CM | POA: Diagnosis not present

## 2016-09-27 DIAGNOSIS — M6281 Muscle weakness (generalized): Secondary | ICD-10-CM | POA: Diagnosis not present

## 2016-09-27 NOTE — Therapy (Signed)
[place], [place], [address] Phone: [phone]   Fax:  [phone]  Physical Therapy Treatment  Patient Details  Name: Colleen Hancock MRN: [id number] Date of Birth: 03-Jan-1946 Referring Provider: Dr. Rolm Bookbinder  Encounter Date: 09/27/2016      PT End of Session - 09/27/16 0849    Visit Number 6   Number of Visits 8   Date for PT Re-Evaluation 09/29/16   PT Start Time 0801   PT Stop Time 0846   PT Time Calculation (min) 45 min   Activity Tolerance Patient tolerated treatment well   Behavior During Therapy [facility] for tasks assessed/performed      Past Medical History:  Diagnosis Date  . Anxiety   . Arthritis    hands, knees  . Asthma    borderine  . Breast CA ([place]) 10/2012   Left Breast  . Complication of anesthesia    during colonoscopy fentanyl dropped blood pressure  . Dyspnea    exertion  . GERD (gastroesophageal reflux disease)   . Heart murmur   . High cholesterol   . IBS (irritable bowel syndrome)     Past Surgical History:  Procedure Laterality Date  . BREAST BIOPSY Left 10/2012   Stereo- Malignant  . BREAST BIOPSY Left    U/S Core- Benign  . BREAST EXCISIONAL BIOPSY Left   . BREAST EXCISIONAL BIOPSY Left   . BREAST LUMPECTOMY Left 11/2012  . CHOLECYSTECTOMY    . COLONOSCOPY    . DILATION AND CURETTAGE OF UTERUS  1967  . MASTECTOMY W/ SENTINEL NODE BIOPSY Left 08/18/2016  . MASTECTOMY W/ SENTINEL NODE BIOPSY Left 08/18/2016   Procedure: LEFT TOTAL MASTECTOMY WITH SENTINEL LYMPH NODE BIOPSY WITH BLUE DYE INJECTION;  Surgeon: Rolm Bookbinder, MD;  Location: [facility];  Service: General;  Laterality: Left;  . SKIN CANCER EXCISION Left    eye brow    There were no vitals filed for this visit.      Subjective Assessment - 09/27/16 0805    Subjective I think my swelling is moving more across my chest. I have this spot in my ribs that just won't seem to let loose.  Sometimes I get a spasm in there and that is from the first surgery I had.    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  Recurrence 6/18  with left mastectomy and sentinel node bioplsy on 08/18/2016. She says the doctor said her had to "scrape my ribs".  No chemo or radiation is needed at this time  IBS since 1980.   Patient Stated Goals Move arm normally   Currently in Pain? No/denies   Pain Score 0-No pain            [facility] PT Assessment - 09/27/16 0001      AROM   Left Shoulder Flexion 150 Degrees   Left Shoulder ABduction 114 Degrees                     [facility] Adult PT Treatment/Exercise - 09/27/16 0001      Manual Therapy   Manual Therapy Soft tissue mobilization;Myofascial release;Scapular mobilization   Soft tissue mobilization very gently to area surrounding lateral aspect of mastectomy incision (away from wound), and to area of tightness in area inferior to mastectomy scar   Myofascial Release gentle left UE myofascial pulling   Scapular Mobilization to left scapula in right sidelying with  movement into protraction at different angles and into depression   Passive ROM in supine to left shoulder into er, abduction, and flexion; in right sidelying into flexion                        Long Term Clinic Goals - 09/27/16 0846      CC Long Term Goal  #1   Title Patient will verbalize understanding of lymphedema risk reduction practices.   Time 4   Period Weeks   Status On-going     CC Long Term Goal  #2   Title Increase left shoulder active flexion to >/= 120 degrees to increased ease reaching and performing daily tasks.   Time 4   Period Weeks   Status Achieved     CC Long Term Goal  #3   Title Increase left shoulder active abduction to >/= 120 degrees to increased ease reaching and performing daily tasks.   Baseline 09/27/16- 114 degrees   Time 4   Period Weeks   Status On-going      CC Long Term Goal  #4   Title Patient will report she has returned to >/= 50% of normal household chores including cooking and cleaning.   Time 4   Period Weeks   Status On-going            Plan - 09/27/16 4098    Clinical Impression Statement Continued with PROM to left shoulder today. His PROM improved greatly from beginning of session to end of session. She has a lot of tightness in left lateral chest and around mastectomy scar. She has developed some swelling across anterior chest above mastectomy scar. If this persists at next visit will add MLD to plan of care to address this.    Rehab Potential Excellent   Clinical Impairments Affecting Rehab Potential delayed healing on chest wound    PT Frequency 2x / week   PT Duration 4 weeks   PT Treatment/Interventions ADLs/Self Care Home Management;Patient/family education;Manual techniques;Passive range of motion;Therapeutic exercise;Therapeutic activities   PT Next Visit Plan continue with shoulder ROM as tolerated with caution around healing incision   PT Home Exercise Plan assess left anterior chest swelling and if it persists at MLD to POC next visit, Shoulder ROM; add lower trunk rotation   Consulted and Agree with Plan of Care Patient      Patient will benefit from skilled therapeutic intervention in order to improve the following deficits and impairments:  Decreased skin integrity, Decreased scar mobility, Decreased knowledge of precautions, Pain, Impaired UE functional use, Increased fascial restricitons, Decreased strength, Decreased range of motion, Postural dysfunction  Visit Diagnosis: Aftercare following surgery for neoplasm  Stiffness of left shoulder, not elsewhere classified  Abnormal posture     Problem List Patient Active Problem List   Diagnosis Date Noted  . Breast neoplasm, Tis (DCIS), 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  . Asthma 01/26/2016  . PNA (pneumonia) 04/11/2014  . Hyperlipemia 04/11/2014  . Ductal carcinoma in  situ (DCIS) of left breast 04/11/2014    Allyson Sabal [facility] 09/27/2016, 8:54 AM  [place], [place], [address] Phone: [phone]   Fax:  [phone]  Name: Colleen Hancock MRN: [id number] Date of Birth: 05/03/1945  Manus Gunning, PT 09/27/16 8:54 AM

## 2016-09-28 ENCOUNTER — Ambulatory Visit: Payer: Medicare Other | Admitting: Physical Therapy

## 2016-09-28 DIAGNOSIS — M25612 Stiffness of left shoulder, not elsewhere classified: Secondary | ICD-10-CM

## 2016-09-28 DIAGNOSIS — R293 Abnormal posture: Secondary | ICD-10-CM | POA: Diagnosis not present

## 2016-09-28 DIAGNOSIS — M6281 Muscle weakness (generalized): Secondary | ICD-10-CM

## 2016-09-28 DIAGNOSIS — Z483 Aftercare following surgery for neoplasm: Secondary | ICD-10-CM | POA: Diagnosis not present

## 2016-09-28 NOTE — Therapy (Signed)
[place], [place], [address] Phone: [phone]   Fax:  [phone]  Physical Therapy Treatment  Patient Details  Name: Colleen Hancock MRN: [id number] Date of Birth: 1945-08-31 Referring Provider: Dr. Rolm Bookbinder  Encounter Date: 09/28/2016      PT End of Session - 09/28/16 1243    Visit Number --   Number of Visits --      Past Medical History:  Diagnosis Date  . Anxiety   . Arthritis    hands, knees  . Asthma    borderine  . Breast CA ([place]) 10/2012   Left Breast  . Complication of anesthesia    during colonoscopy fentanyl dropped blood pressure  . Dyspnea    exertion  . GERD (gastroesophageal reflux disease)   . Heart murmur   . High cholesterol   . IBS (irritable bowel syndrome)     Past Surgical History:  Procedure Laterality Date  . BREAST BIOPSY Left 10/2012   Stereo- Malignant  . BREAST BIOPSY Left    U/S Core- Benign  . BREAST EXCISIONAL BIOPSY Left   . BREAST EXCISIONAL BIOPSY Left   . BREAST LUMPECTOMY Left 11/2012  . CHOLECYSTECTOMY    . COLONOSCOPY    . DILATION AND CURETTAGE OF UTERUS  1967  . MASTECTOMY W/ SENTINEL NODE BIOPSY Left 08/18/2016  . MASTECTOMY W/ SENTINEL NODE BIOPSY Left 08/18/2016   Procedure: LEFT TOTAL MASTECTOMY WITH SENTINEL LYMPH NODE BIOPSY WITH BLUE DYE INJECTION;  Surgeon: Rolm Bookbinder, MD;  Location: [facility];  Service: General;  Laterality: Left;  . SKIN CANCER EXCISION Left    eye brow    There were no vitals filed for this visit.      Subjective Assessment - 09/28/16 1153    Subjective Pt is concerned about the increased swelling she has at anterior and lateral chest. She thinks the swelling started the day after she had to push her car to get it out of traffic when it broke down.  She is having a itchy red rash under the tape at her anterior  chest    Pertinent History Left lumpectomy with SNLB for DCIS 8/14 with 36  radiation treatments at that time.  Recurrence 6/18  with left mastectomy and sentinel node bioplsy on 08/18/2016. She says the doctor said her had to "scrape my ribs".  No chemo or radiation is needed at this time  IBS since 1980.   Patient Stated Goals Move arm normally   Currently in Pain? No/denies            [facility] PT Assessment - 09/28/16 0001      Observation/Other Assessments   Skin Integrity erythema under area where tape was on anterior chest. wound appears to be healing well, decreased in lenth and depth and has a red granulation tissue wound base      AROM   Left Shoulder Flexion 150 Degrees   Left Shoulder ABduction 117 Degrees  limited by pulling at lateral chest      Palpation   Patella mobility                       [facility] Adult PT Treatment/Exercise - 09/28/16 0001      Self-Care   Other Self-Care Comments  advised pt to return to use of binder to hold dressing on wound so she does not have to use tape and to offer compression to lateral chest . Whe will  continue to use white foam patch at anterior axilla      Shoulder Exercises: Supine   Other Supine Exercises supine dowel rod in flexion and abduction with with stretch with deep breaths at the top    Other Supine Exercises 5 small circles with hand pointed to ceiling.      Manual Therapy   Manual Lymphatic Drainage (MLD) in supine 5 deep breaths, right axillary nodes, anterior interaxillary anastamosis with hand over hand instruction.    Passive ROM in supine to left shoulder into er, abduction, and flexion; in right sidelying into flexion                PT Education - 09/28/16 1243    Person(s) Educated Patient   Comprehension Verbalized understanding                [facility] Goals - 09/28/16 1140      CC Long Term Goal  #1   Title Patient will verbalize understanding of lymphedema risk reduction practices.   Time 8   Period Weeks   Status On-going     CC Long  Term Goal  #2   Title Increase left shoulder active flexion to >/= 120 degrees to increased ease reaching and performing daily tasks.   Time 8   Status Achieved     CC Long Term Goal  #3   Title Increase left shoulder active abduction to >/= 120 degrees to increased ease reaching and performing daily tasks.   Baseline 09/27/16- 114 degrees     CC Long Term Goal  #4   Title Patient will report she has returned to >/= 50% of normal household chores including cooking and cleaning.   Baseline pt says she has only returned to 25% she doing some cooking and some laundry.  still not able to do cleaning    Time 8   Period Weeks   Status On-going     CC Long Term Goal  #5   Title Pt will be independent in a home exercise program    Time 8   Period Weeks   Status New            Plan - 09/28/16 1233    Clinical Impression Statement Pt has increased swelling in left anterior and lateral chest after an incident of overuse. She continues to have myofascial tightness around her scar limiting left shoulder abduction Her wound is healing well.  She would continue to benefit from PT to return to usual UE function at home. Her  husband is having surgery next week so she may miss a few sessions so will renew for 8 weeks to assure adequate time for goal achievement    Rehab Potential Good   Clinical Impairments Affecting Rehab Potential delayed healing on chest wound    PT Frequency 2x / week   PT Duration 8 weeks   PT Treatment/Interventions ADLs/Self Care Home Management;Patient/family education;Manual techniques;Passive range of motion;Therapeutic activities;Taping;Neuromuscular re-education;Therapeutic exercise;Manual lymph drainage;Scar mobilization   PT Next Visit Plan continue with shoulder ROM as tolerated with caution around healing incision. with continue with MLD as needed. Progress shoulder strengthening    PT Home Exercise Plan assess left anterior chest swelling and if it persists at MLD  to POC next visit, Shoulder ROM; add lower trunk rotation   Consulted and Agree with Plan of Care Patient      Patient will benefit from skilled therapeutic intervention in order to improve the following deficits  and impairments:  Decreased skin integrity, Decreased scar mobility, Decreased knowledge of precautions, Pain, Impaired UE functional use, Increased fascial restricitons, Decreased strength, Decreased range of motion, Postural dysfunction  Visit Diagnosis: Aftercare following surgery for neoplasm - Plan: PT plan of care cert/re-cert  Stiffness of left shoulder, not elsewhere classified - Plan: PT plan of care cert/re-cert  Abnormal posture - Plan: PT plan of care cert/re-cert  Muscle weakness (generalized) - Plan: PT plan of care cert/re-cert     Problem List Patient Active Problem List   Diagnosis Date Noted  . Breast neoplasm, Tis (DCIS), 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  . Asthma 01/26/2016  . PNA (pneumonia) 04/11/2014  . Hyperlipemia 04/11/2014  . 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   Donato Heinz. Owens Shark PT  [name] 09/28/2016, 12:51 PM  [address] [place], [place], [address] Phone: [phone]   Fax:  [phone]  Name: Lilian Fuhs MRN: [id number] Date of Birth: 1945-09-19

## 2016-09-29 ENCOUNTER — Ambulatory Visit: Payer: Medicare Other | Admitting: Physical Therapy

## 2016-10-04 ENCOUNTER — Ambulatory Visit: Payer: Medicare Other | Admitting: Physical Therapy

## 2016-10-04 ENCOUNTER — Encounter: Payer: Self-pay | Admitting: Physical Therapy

## 2016-10-04 DIAGNOSIS — R293 Abnormal posture: Secondary | ICD-10-CM | POA: Diagnosis not present

## 2016-10-04 DIAGNOSIS — Z483 Aftercare following surgery for neoplasm: Secondary | ICD-10-CM

## 2016-10-04 DIAGNOSIS — M6281 Muscle weakness (generalized): Secondary | ICD-10-CM | POA: Diagnosis not present

## 2016-10-04 DIAGNOSIS — M25612 Stiffness of left shoulder, not elsewhere classified: Secondary | ICD-10-CM

## 2016-10-04 NOTE — Therapy (Signed)
Colleen Hancock, [place], [address] Phone: [phone]   Fax:  [phone]  Physical Therapy Treatment  Patient Details  Name: Colleen Hancock MRN: [id number] Date of Birth: 10/24/1945 Referring Provider: Dr. Rolm Bookbinder  Encounter Date: 10/04/2016      PT End of Session - 10/04/16 1456    Visit Number 8   Number of Visits 24   Date for PT Re-Evaluation 11/30/17   PT Start Time 1406   PT Stop Time 1453   PT Time Calculation (min) 47 min   Activity Tolerance Patient tolerated treatment well   Behavior During Therapy [facility] for tasks assessed/performed      Past Medical History:  Diagnosis Date  . Anxiety   . Arthritis    hands, knees  . Asthma    borderine  . Breast CA ([place]) 10/2012   Left Breast  . Complication of anesthesia    during colonoscopy fentanyl dropped blood pressure  . Dyspnea    exertion  . GERD (gastroesophageal reflux disease)   . Heart murmur   . High cholesterol   . IBS (irritable bowel syndrome)     Past Surgical History:  Procedure Laterality Date  . BREAST BIOPSY Left 10/2012   Stereo- Malignant  . BREAST BIOPSY Left    U/S Core- Benign  . BREAST EXCISIONAL BIOPSY Left   . BREAST EXCISIONAL BIOPSY Left   . BREAST LUMPECTOMY Left 11/2012  . CHOLECYSTECTOMY    . COLONOSCOPY    . DILATION AND CURETTAGE OF UTERUS  1967  . MASTECTOMY W/ SENTINEL NODE BIOPSY Left 08/18/2016  . MASTECTOMY W/ SENTINEL NODE BIOPSY Left 08/18/2016   Procedure: LEFT TOTAL MASTECTOMY WITH SENTINEL LYMPH NODE BIOPSY WITH BLUE DYE INJECTION;  Surgeon: Rolm Bookbinder, MD;  Location: [place];  Service: General;  Laterality: Left;  . SKIN CANCER EXCISION Left    eye brow    There were no vitals filed for this visit.      Subjective Assessment - 10/04/16 1409    Subjective Pt reports her wound is almost closed. Pt is wearing a smaller wound dressing today. The swelling keeps coming  back. She reports she can get it to go down but when she wakes up it is back.    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  Recurrence 6/18  with left mastectomy and sentinel node bioplsy on 08/18/2016. She says the doctor said her had to "scrape my ribs".  No chemo or radiation is needed at this time  IBS since 1980.   Patient Stated Goals Move arm normally   Currently in Pain? No/denies   Pain Score 0-No pain            [facility] PT Assessment - 10/04/16 0001      AROM   Left Shoulder Flexion 152 Degrees   Left Shoulder ABduction 135 Degrees                     [facility] Adult PT Treatment/Exercise - 10/04/16 0001      Shoulder Exercises: Stretch   Other Shoulder Stretches seated edge of mat, with right arm resting against outer left leg gently stretching knees to right, left arm up and overhead leaning towards right and slowly rotating slightly backwards to open up left trunk     Manual Therapy   Soft tissue mobilization very gently to area surrounding lateral aspect of mastectomy  incision (away from wound), and to area of tightness in area inferior to mastectomy scar   Manual Lymphatic Drainage (MLD) in supine 5 diaphragmatic breaths, right axillary nodes, anterior interaxillary anastamosis, left inguinal nodes and establishment of axillo inguinal anastomosis, moved fluid superior to mastectomy scar towards interaxillary pathway and fluid inferior towards axillo inguinal pathway   Passive ROM in supine to left shoulder into er, abduction, and flexion                        Long Term Clinic Goals - 09/28/16 1140      CC Long Term Goal  #1   Title Patient will verbalize understanding of lymphedema risk reduction practices.   Time 8   Period Weeks   Status On-going     CC Long Term Goal  #2   Title Increase left shoulder active flexion to >/= 120 degrees to increased ease reaching and performing  daily tasks.   Time 8   Status Achieved     CC Long Term Goal  #3   Title Increase left shoulder active abduction to >/= 120 degrees to increased ease reaching and performing daily tasks.   Baseline 09/27/16- 114 degrees     CC Long Term Goal  #4   Title Patient will report she has returned to >/= 50% of normal household chores including cooking and cleaning.   Baseline pt says she has only returned to 25% she doing some cooking and some laundry.  still not able to do cleaning    Time 8   Period Weeks   Status On-going     CC Long Term Goal  #5   Title Pt will be independent in a home exercise program    Time 8   Period Weeks   Status New            Plan - 10/04/16 1457    Clinical Impression Statement Patient demonstrates marked improvement in left shoulder abduction following PROM today. She demonstrated 135 degrees. Continued with MLD to areas of swelling around incision. Soft tissue mobilization performed to area of tightness inferior to scar. Instructed pt in a seated trunk rotation stretch with left arm overhead to help decrease pain in left trunk.   Rehab Potential Good   Clinical Impairments Affecting Rehab Potential delayed healing on chest wound    PT Frequency 2x / week   PT Duration 8 weeks   PT Treatment/Interventions ADLs/Self Care Home Management;Patient/family education;Manual techniques;Passive range of motion;Therapeutic activities;Taping;Neuromuscular re-education;Therapeutic exercise;Manual lymph drainage;Scar mobilization   PT Next Visit Plan continue with shoulder ROM as tolerated with caution around healing incision. with continue with MLD as needed. Progress shoulder strengthening - possibly add supine scap   PT Home Exercise Plan ask about tightness since pt has been performing seated trunk rotation stretch, assess left anterior chest swelling and if it persists at MLD to POC next visit, Shoulder ROM; add lower trunk rotation   Consulted and Agree with Plan  of Care Patient      Patient will benefit from skilled therapeutic intervention in order to improve the following deficits and impairments:  Decreased skin integrity, Decreased scar mobility, Decreased knowledge of precautions, Pain, Impaired UE functional use, Increased fascial restricitons, Decreased strength, Decreased range of motion, Postural dysfunction  Visit Diagnosis: Aftercare following surgery for neoplasm  Stiffness of left shoulder, not elsewhere classified  Abnormal posture     Problem List Patient Active Problem List   Diagnosis  Date Noted  . Breast neoplasm, Tis (DCIS), 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  . Asthma 01/26/2016  . PNA (pneumonia) 04/11/2014  . Hyperlipemia 04/11/2014  . 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    Allyson Sabal [facility] 10/04/2016, 3:02 PM  [place], [place], [address] Phone: [phone]   Fax:  [phone]  Name: Jeweldean Drohan MRN: [id number] Date of Birth: June 12, 1945  Manus Gunning, PT 10/04/16 3:03 PM

## 2016-10-06 ENCOUNTER — Ambulatory Visit: Payer: Medicare Other | Attending: General Surgery

## 2016-10-06 DIAGNOSIS — M25612 Stiffness of left shoulder, not elsewhere classified: Secondary | ICD-10-CM | POA: Diagnosis not present

## 2016-10-06 DIAGNOSIS — Z483 Aftercare following surgery for neoplasm: Secondary | ICD-10-CM | POA: Diagnosis not present

## 2016-10-06 DIAGNOSIS — M6281 Muscle weakness (generalized): Secondary | ICD-10-CM | POA: Diagnosis not present

## 2016-10-06 DIAGNOSIS — R293 Abnormal posture: Secondary | ICD-10-CM

## 2016-10-06 NOTE — Patient Instructions (Signed)

## 2016-10-06 NOTE — Therapy (Signed)
[place], [place], [address] Phone: [phone]   Fax:  [phone]  Physical Therapy Treatment  Patient Details  Name: Colleen Hancock MRN: [id number] Date of Birth: Jul 19, 1945 Referring Provider: Dr. Rolm Bookbinder  Encounter Date: 10/06/2016      PT End of Session - 10/06/16 1152    Visit Number 9   Number of Visits 24   Date for PT Re-Evaluation 11/30/17   PT Start Time 1054   PT Stop Time 1150   PT Time Calculation (min) 56 min   Activity Tolerance Patient tolerated treatment well   Behavior During Therapy [facility] for tasks assessed/performed      Past Medical History:  Diagnosis Date  . Anxiety   . Arthritis    hands, knees  . Asthma    borderine  . Breast CA ([place]) 10/2012   Left Breast  . Complication of anesthesia    during colonoscopy fentanyl dropped blood pressure  . Dyspnea    exertion  . GERD (gastroesophageal reflux disease)   . Heart murmur   . High cholesterol   . IBS (irritable bowel syndrome)     Past Surgical History:  Procedure Laterality Date  . BREAST BIOPSY Left 10/2012   Stereo- Malignant  . BREAST BIOPSY Left    U/S Core- Benign  . BREAST EXCISIONAL BIOPSY Left   . BREAST EXCISIONAL BIOPSY Left   . BREAST LUMPECTOMY Left 11/2012  . CHOLECYSTECTOMY    . COLONOSCOPY    . DILATION AND CURETTAGE OF UTERUS  1967  . MASTECTOMY W/ SENTINEL NODE BIOPSY Left 08/18/2016  . MASTECTOMY W/ SENTINEL NODE BIOPSY Left 08/18/2016   Procedure: LEFT TOTAL MASTECTOMY WITH SENTINEL LYMPH NODE BIOPSY WITH BLUE DYE INJECTION;  Surgeon: Rolm Bookbinder, MD;  Location: [place];  Service: General;  Laterality: Left;  . SKIN CANCER EXCISION Left    eye brow    There were no vitals filed for this visit.      Subjective Assessment - 10/06/16 1058    Subjective I saw Dr. Donne Hazel yesterday and he cauterized the edges of the wound to promote healing. He was very pleased  overall with how I'm healing, quicker than he thought with how much my skin had been damaged from radiation. Wearing my compression bra for the first time in awhile and it feels okay so far.    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  Recurrence 6/18  with left mastectomy and sentinel node bioplsy on 08/18/2016. She says the doctor said her had to "scrape my ribs".  No chemo or radiation is needed at this time  IBS since 1980.   Patient Stated Goals Move arm normally   Currently in Pain? No/denies                         [facility] Adult PT Treatment/Exercise - 10/06/16 0001      Shoulder Exercises: Supine   Horizontal ABduction Strengthening;Both;10 reps;Theraband   Theraband Level (Shoulder Horizontal ABduction) Level 1 (Yellow)   External Rotation Strengthening;Both;10 reps;Theraband   Theraband Level (Shoulder External Rotation) Level 1 (Yellow)   Flexion Strengthening;Both;10 reps;Theraband  Narrow and wide grip, 10 times each   Theraband Level (Shoulder Flexion) Level 1 (Yellow)   Other Supine Exercises Bil D2 with yellow theraband, 5 times each with pt returning correct therapist demonstration     Manual Therapy  Soft tissue mobilization very gently to area surrounding lateral aspect of mastectomy incision (away from wound), and to area of tightness in area inferior to mastectomy scar   Manual Lymphatic Drainage (MLD) in supine 5 diaphragmatic breaths, right axillary nodes, anterior interaxillary anastamosis, left inguinal nodes and establishment of axillo inguinal anastomosis, moved fluid superior to mastectomy scar towards interaxillary pathway and fluid inferior towards axillo inguinal pathway   Passive ROM in supine to left shoulder into er, abduction, and flexion                PT Education - 10/06/16 1114    Education provided Yes   Education Details Supine scapular series with yellow theraband    Person(s) Educated Patient   Methods Explanation;Demonstration;Handout   Comprehension Verbalized understanding;Returned demonstration;Need further instruction                Long Term Clinic Goals - 09/28/16 1140      CC Long Term Goal  #1   Title Patient will verbalize understanding of lymphedema risk reduction practices.   Time 8   Period Weeks   Status On-going     CC Long Term Goal  #2   Title Increase left shoulder active flexion to >/= 120 degrees to increased ease reaching and performing daily tasks.   Time 8   Status Achieved     CC Long Term Goal  #3   Title Increase left shoulder active abduction to >/= 120 degrees to increased ease reaching and performing daily tasks.   Baseline 09/27/16- 114 degrees     CC Long Term Goal  #4   Title Patient will report she has returned to >/= 50% of normal household chores including cooking and cleaning.   Baseline pt says she has only returned to 25% she doing some cooking and some laundry.  still not able to do cleaning    Time 8   Period Weeks   Status On-going     CC Long Term Goal  #5   Title Pt will be independent in a home exercise program    Time 8   Period Weeks   Status New            Plan - 10/06/16 1156    Clinical Impression Statement PT did well wtih instruction of supine scapular series with yellow theraband. She did not demonstrate any pulling at incision with any of the exercises, just reported feeling the discomfort she normally feels at her Lt rib area. This was not flared up after. She continues to tolerate stretching well and noticing increased ROM with her ADLs. Pt has been doing seated trunk rotation stretch but does not feel this has given her any relief, she feels the rib pain is something she is going to have to live with as she has had it since finishing radiation 4 years ago.    Rehab Potential Good   Clinical Impairments Affecting Rehab Potential delayed healing on chest wound    PT  Frequency 2x / week   PT Duration 8 weeks   PT Treatment/Interventions ADLs/Self Care Home Management;Patient/family education;Manual techniques;Passive range of motion;Therapeutic activities;Taping;Neuromuscular re-education;Therapeutic exercise;Manual lymph drainage;Scar mobilization   PT Next Visit Plan continue with shoulder ROM as tolerated with caution around healing incision. Continue with MLD as needed. Review supine scapular series/assess how pt felt after performing this later in day.   Consulted and Agree with Plan of Care Patient      Patient will benefit from skilled  therapeutic intervention in order to improve the following deficits and impairments:  Decreased skin integrity, Decreased scar mobility, Decreased knowledge of precautions, Pain, Impaired UE functional use, Increased fascial restricitons, Decreased strength, Decreased range of motion, Postural dysfunction  Visit Diagnosis: Aftercare following surgery for neoplasm  Stiffness of left shoulder, not elsewhere classified  Abnormal posture  Muscle weakness (generalized)     Problem List Patient Active Problem List   Diagnosis Date Noted  . Breast neoplasm, Tis (DCIS), 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  . Asthma 01/26/2016  . PNA (pneumonia) 04/11/2014  . Hyperlipemia 04/11/2014  . 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    Otelia Limes, PTA 10/06/2016, 12:03 PM  [place], [place], [address] Phone: [phone]   Fax:  [phone]  Name: Colleen Hancock MRN: [id number] Date of Birth: [birth year]

## 2016-10-11 ENCOUNTER — Ambulatory Visit: Payer: Medicare Other | Admitting: Physical Therapy

## 2016-10-11 DIAGNOSIS — Z483 Aftercare following surgery for neoplasm: Secondary | ICD-10-CM

## 2016-10-11 DIAGNOSIS — M25612 Stiffness of left shoulder, not elsewhere classified: Secondary | ICD-10-CM

## 2016-10-11 DIAGNOSIS — M6281 Muscle weakness (generalized): Secondary | ICD-10-CM

## 2016-10-11 DIAGNOSIS — R293 Abnormal posture: Secondary | ICD-10-CM | POA: Diagnosis not present

## 2016-10-11 NOTE — Therapy (Signed)
[place], [place], [address] Phone: [phone]   Fax:  [phone]  Physical Therapy Treatment  Patient Details  Name: Colleen Hancock MRN: [id number] Date of Birth: 13-Mar-1945 Referring Provider: Dr. Rolm Bookbinder  Encounter Date: 10/11/2016      PT End of Session - 10/11/16 1213    Visit Number 10   Number of Visits 24   Date for PT Re-Evaluation 11/30/17   PT Start Time 1100   PT Stop Time 1145   PT Time Calculation (min) 45 min   Activity Tolerance Patient tolerated treatment well   Behavior During Therapy [facility] for tasks assessed/performed      Past Medical History:  Diagnosis Date  . Anxiety   . Arthritis    hands, knees  . Asthma    borderine  . Breast CA ([place]) 10/2012   Left Breast  . Complication of anesthesia    during colonoscopy fentanyl dropped blood pressure  . Dyspnea    exertion  . GERD (gastroesophageal reflux disease)   . Heart murmur   . High cholesterol   . IBS (irritable bowel syndrome)     Past Surgical History:  Procedure Laterality Date  . BREAST BIOPSY Left 10/2012   Stereo- Malignant  . BREAST BIOPSY Left    U/S Core- Benign  . BREAST EXCISIONAL BIOPSY Left   . BREAST EXCISIONAL BIOPSY Left   . BREAST LUMPECTOMY Left 11/2012  . CHOLECYSTECTOMY    . COLONOSCOPY    . DILATION AND CURETTAGE OF UTERUS  1967  . MASTECTOMY W/ SENTINEL NODE BIOPSY Left 08/18/2016  . MASTECTOMY W/ SENTINEL NODE BIOPSY Left 08/18/2016   Procedure: LEFT TOTAL MASTECTOMY WITH SENTINEL LYMPH NODE BIOPSY WITH BLUE DYE INJECTION;  Surgeon: Rolm Bookbinder, MD;  Location: [place];  Service: General;  Laterality: Left;  . SKIN CANCER EXCISION Left    eye brow    There were no vitals filed for this visit.      Subjective Assessment - 10/11/16 1202    Subjective "I didn't even use a dressing today"  Pt continues to have healing of anterior chest wound    Pertinent History  Left lumpectomy with SNLB for DCIS 8/14 with 36 radiation treatments at that time.  Recurrence 6/18  with left mastectomy and sentinel node bioplsy on 08/18/2016. She says the doctor said her had to "scrape my ribs".  No chemo or radiation is needed at this time  IBS since 1980.   Patient Stated Goals Move arm normally   Currently in Pain? No/denies  intermittently  has cramping in right  back and at  left ribs             [facility] PT Assessment - 10/11/16 0001      Observation/Other Assessments   Skin Integrity wound continues to close , now with 3 smal open areas.  pt has thick scab at lateral wound at area where tight soft tissue joins.                      [facility] Adult PT Treatment/Exercise - 10/11/16 0001      Elbow Exercises   Elbow Flexion Strengthening;Left;10 reps;Supine   Bar Weights/Barbell (Elbow Flexion) 1 lb   Elbow Extension Strengthening;Left;5 reps;Supine;Bar weights/barbell   Bar Weights/Barbell (Elbow Extension) 1 lb     Knee/Hip Exercises: Standing   Forward Step Up Both;Step Height: 4";Other (comment)  30 seconds, self selected pace  Shoulder Exercises: Supine   Other Supine Exercises 5 small circles with hand pointed to ceiling.      Shoulder Exercises: Sidelying   ABduction AAROM;Left;5 reps  with breath at the top.  limited by pain      Shoulder Exercises: Stretch   Other Shoulder Stretches arm overhead with pillow under elbow      Manual Therapy   Myofascial Release to left lateral chest at area of tightness in [place] Clinic Goals - 09/28/16 1140      CC Long Term Goal  #1   Title Patient will verbalize understanding of lymphedema risk reduction practices.   Time 8   Period Weeks   Status On-going     CC Long Term Goal  #2   Title Increase left shoulder active flexion to >/= 120 degrees to increased ease reaching and performing daily tasks.   Time 8   Status Achieved      CC Long Term Goal  #3   Title Increase left shoulder active abduction to >/= 120 degrees to increased ease reaching and performing daily tasks.   Baseline 09/27/16- 114 degrees     CC Long Term Goal  #4   Title Patient will report she has returned to >/= 50% of normal household chores including cooking and cleaning.   Baseline pt says she has only returned to 25% she doing some cooking and some laundry.  still not able to do cleaning    Time 8   Period Weeks   Status On-going     CC Long Term Goal  #5   Title Pt will be independent in a home exercise program    Time 8   Period Weeks   Status New            Plan - 2016-10-14 1213    Clinical Impression Statement Pt concerned about husband as he will be having surgery on Thursday and she will have to take care of him.  She continues to have tightness in left lateral chest .  she reports she is doing her exercises at home.  Upgraded home exercise porgram and added some LE exercise for general strength also    Rehab Potential Good   Clinical Impairments Affecting Rehab Potential delayed healing on chest wound    PT Frequency 2x / week   PT Duration 8 weeks   PT Treatment/Interventions ADLs/Self Care Home Management;Patient/family education;Manual techniques;Passive range of motion;Therapeutic activities;Taping;Neuromuscular re-education;Therapeutic exercise;Manual lymph drainage;Scar mobilization   PT Next Visit Plan continue with shoulder ROM as tolerated with caution around healing incision. Continue with MLD as needed. Review home exercise and add progressive strengthening    Consulted and Agree with Plan of Care Patient      Patient will benefit from skilled therapeutic intervention in order to improve the following deficits and impairments:  Decreased skin integrity, Decreased scar mobility, Decreased knowledge of precautions, Pain, Impaired UE functional use, Increased fascial restricitons, Decreased strength, Decreased range of  motion, Postural dysfunction  Visit Diagnosis: Aftercare following surgery for neoplasm  Stiffness of left shoulder, not elsewhere classified  Abnormal posture  Muscle weakness (generalized)       G-Codes - 14-Oct-2016 1302    Functional Assessment Tool Used (Outpatient Only) clincial judgement    Functional Limitation Carrying, moving and handling objects   Carrying, Moving and Handling  Objects Current Status [phone]) At least 40 percent but less than 60 percent impaired, limited or restricted   Carrying, Moving and Handling Objects Goal Status ([id number]) At least 1 percent but less than 20 percent impaired, limited or restricted      Problem List Patient Active Problem List   Diagnosis Date Noted  . Breast neoplasm, Tis (DCIS), 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  . Asthma 01/26/2016  . PNA (pneumonia) 04/11/2014  . Hyperlipemia 04/11/2014  . 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   Donato Heinz. Owens Shark PT  [name] 10/11/2016, 1:03 PM  [place], [place], [address] Phone: [phone]   Fax:  [phone]  Name: Colleen Hancock MRN: [id number] Date of Birth: 08-21-1945

## 2016-10-11 NOTE — Patient Instructions (Signed)
MORE EXERCISE IDEAS:   --Lie on back with arm over head and and pillow under elbow...  [id number]  --Pelvic tilt and lower trunk rotation for the charley horse  -- hold 1 # in hand and make slow circles with hand toward ceiling. . Then try to stop and hold at several places around the circle.  -- Bicep curls -- Skull crusher  ( start with 5 and go to 15)

## 2016-10-13 ENCOUNTER — Encounter: Payer: Medicare Other | Admitting: Physical Therapy

## 2016-10-18 ENCOUNTER — Ambulatory Visit: Payer: Medicare Other | Admitting: Physical Therapy

## 2016-10-18 DIAGNOSIS — M25612 Stiffness of left shoulder, not elsewhere classified: Secondary | ICD-10-CM | POA: Diagnosis not present

## 2016-10-18 DIAGNOSIS — R293 Abnormal posture: Secondary | ICD-10-CM | POA: Diagnosis not present

## 2016-10-18 DIAGNOSIS — Z483 Aftercare following surgery for neoplasm: Secondary | ICD-10-CM | POA: Diagnosis not present

## 2016-10-18 DIAGNOSIS — M6281 Muscle weakness (generalized): Secondary | ICD-10-CM

## 2016-10-18 NOTE — Therapy (Signed)
[place], [place], [address] Phone: [phone]   Fax:  [phone]  Physical Therapy Treatment  Patient Details  Name: Colleen Hancock MRN: [id number] Date of Birth: 02-02-1946 Referring Provider: Dr. Rolm Bookbinder  Encounter Date: 10/18/2016      PT End of Session - 10/18/16 1716    Visit Number 11   Number of Visits 24   Date for PT Re-Evaluation 11/30/17   PT Start Time 1448   PT Stop Time 1430   PT Time Calculation (min) 45 min   Activity Tolerance Patient tolerated treatment well   Behavior During Therapy [facility] for tasks assessed/performed      Past Medical History:  Diagnosis Date  . Anxiety   . Arthritis    hands, knees  . Asthma    borderine  . Breast CA ([place]) 10/2012   Left Breast  . Complication of anesthesia    during colonoscopy fentanyl dropped blood pressure  . Dyspnea    exertion  . GERD (gastroesophageal reflux disease)   . Heart murmur   . High cholesterol   . IBS (irritable bowel syndrome)     Past Surgical History:  Procedure Laterality Date  . BREAST BIOPSY Left 10/2012   Stereo- Malignant  . BREAST BIOPSY Left    U/S Core- Benign  . BREAST EXCISIONAL BIOPSY Left   . BREAST EXCISIONAL BIOPSY Left   . BREAST LUMPECTOMY Left 11/2012  . CHOLECYSTECTOMY    . COLONOSCOPY    . DILATION AND CURETTAGE OF UTERUS  1967  . MASTECTOMY W/ SENTINEL NODE BIOPSY Left 08/18/2016  . MASTECTOMY W/ SENTINEL NODE BIOPSY Left 08/18/2016   Procedure: LEFT TOTAL MASTECTOMY WITH SENTINEL LYMPH NODE BIOPSY WITH BLUE DYE INJECTION;  Surgeon: Rolm Bookbinder, MD;  Location: [place];  Service: General;  Laterality: Left;  . SKIN CANCER EXCISION Left    eye brow    There were no vitals filed for this visit.      Subjective Assessment - 10/18/16 1353    Subjective Pt states that her husband's surgery got postponed til this week because they want to do the surgery in the  hospital instead of at the day surgery center. Pt is concerned that she has not heard about details about the surgery yet and is anxious about that.   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  Recurrence 6/18  with left mastectomy and sentinel node bioplsy on 08/18/2016. She says the doctor said her had to "scrape my ribs".  No chemo or radiation is needed at this time  IBS since 1980.   Patient Stated Goals Move arm normally   Currently in Pain? No/denies                         [facility] Adult PT Treatment/Exercise - 10/18/16 0001      Self-Care   Self-Care Other Self-Care Comments   Other Self-Care Comments  encourged pt to get and appointment at [facility] for compression garment  Manual Therapy   Soft tissue mobilization very gently to area surrounding lateral aspect of mastectomy incision (away from wound), and to area of tightness in area inferior to mastectomy scar   Myofascial Release to left lateral chest at area of tightness in sidelying    Manual Lymphatic Drainage (MLD) in supine 5 diaphragmatic breaths, right axillary nodes, anterior interaxillary anastamosis, left inguinal nodes and establishment of axillo inguinal anastomosis, moved fluid superior to mastectomy scar towards interaxillary pathway and fluid inferior towards axillo inguinal pathway   Passive ROM in supine to left shoulder into er, abduction, and flexion                        Long Term Clinic Goals - 09/28/16 1140      CC Long Term Goal  #1   Title Patient will verbalize understanding of lymphedema risk reduction  practices.   Time 8   Period Weeks   Status On-going     CC Long Term Goal  #2   Title Increase left shoulder active flexion to >/= 120 degrees to increased ease reaching and performing daily tasks.   Time 8   Status Achieved     CC Long Term Goal  #3   Title Increase left shoulder active abduction to >/= 120 degrees to increased ease reaching and performing daily tasks.   Baseline 09/27/16- 114 degrees     CC Long Term Goal  #4   Title Patient will report she has returned to >/= 50% of normal household chores including cooking and cleaning.   Baseline pt says she has only returned to 25% she doing some cooking and some laundry.  still not able to do cleaning    Time 8   Period Weeks   Status On-going     CC Long Term Goal  #5   Title Pt will be independent in a home exercise program    Time 8   Period Weeks   Status New            Plan - 10/18/16 1717    Clinical Impression Statement Pt continues to be very anxious about husband and his upcoming surgery.  Encouraged pt to call to schedule appointment to get compression garments. She continues to have swelling and tightness in chest and around scar.  Wound continues to improve with small adherent eschar at lateral portion of incision.    Rehab Potential Good   Clinical Impairments Affecting Rehab Potential delayed healing on chest wound    PT Next Visit Plan continue with shoulder ROM as tolerated with caution around healing incision. Continue with MLD as needed. Review home exercise and add progressive strengthening       Patient will benefit from skilled therapeutic intervention in order to improve the following deficits and impairments:  Decreased skin integrity, Decreased scar mobility, Decreased knowledge of precautions, Pain, Impaired UE functional use, Increased fascial restricitons, Decreased strength, Decreased range of motion, Postural dysfunction  Visit Diagnosis: Aftercare following surgery for  neoplasm  Stiffness of left shoulder, not elsewhere classified  Abnormal posture  Muscle weakness (generalized)     Problem List Patient Active Problem List   Diagnosis Date Noted  . Breast neoplasm, Tis (DCIS), 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  . Asthma 01/26/2016  . PNA (pneumonia) 04/11/2014  . Hyperlipemia 04/11/2014  . 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   Donato Heinz. Owens Shark, PT  [name] 10/18/2016, 5:20 PM    [place] [address] [place], [place], [address] Phone: [phone]   Fax:  [phone]  Name: Colleen Hancock MRN: [id number] Date of Birth: 09-13-1945

## 2016-10-19 ENCOUNTER — Encounter: Payer: Self-pay | Admitting: Physical Therapy

## 2016-10-19 ENCOUNTER — Ambulatory Visit: Payer: Medicare Other | Admitting: Physical Therapy

## 2016-10-19 DIAGNOSIS — M25612 Stiffness of left shoulder, not elsewhere classified: Secondary | ICD-10-CM | POA: Diagnosis not present

## 2016-10-19 DIAGNOSIS — Z483 Aftercare following surgery for neoplasm: Secondary | ICD-10-CM | POA: Diagnosis not present

## 2016-10-19 DIAGNOSIS — M6281 Muscle weakness (generalized): Secondary | ICD-10-CM | POA: Diagnosis not present

## 2016-10-19 DIAGNOSIS — R293 Abnormal posture: Secondary | ICD-10-CM

## 2016-10-19 NOTE — Therapy (Signed)
[place], [place], [address] Phone: [phone]   Fax:  [phone]  Physical Therapy Treatment  Patient Details  Name: Colleen Hancock MRN: [id number] Date of Birth: [birth year] Referring Provider: Dr. Rolm Bookbinder  Encounter Date: 10/19/2016      PT End of Session - 10/19/16 1503    Visit Number 12   Number of Visits 24   Date for PT Re-Evaluation 11/30/17   PT Start Time 8338   PT Stop Time 1501   PT Time Calculation (min) 44 min   Activity Tolerance Patient tolerated treatment well   Behavior During Therapy [facility] for tasks assessed/performed      Past Medical History:  Diagnosis Date  . Anxiety   . Arthritis    hands, knees  . Asthma    borderine  . Breast CA ([place]) 10/2012   Left Breast  . Complication of anesthesia    during colonoscopy fentanyl dropped blood pressure  . Dyspnea    exertion  . GERD (gastroesophageal reflux disease)   . Heart murmur   . High cholesterol   . IBS (irritable bowel syndrome)     Past Surgical History:  Procedure Laterality Date  . BREAST BIOPSY Left 10/2012   Stereo- Malignant  . BREAST BIOPSY Left    U/S Core- Benign  . BREAST EXCISIONAL BIOPSY Left   . BREAST EXCISIONAL BIOPSY Left   . BREAST LUMPECTOMY Left 11/2012  . CHOLECYSTECTOMY    . COLONOSCOPY    . DILATION AND CURETTAGE OF UTERUS  1967  . MASTECTOMY W/ SENTINEL NODE BIOPSY Left 08/18/2016  . MASTECTOMY W/ SENTINEL NODE BIOPSY Left 08/18/2016   Procedure: LEFT TOTAL MASTECTOMY WITH SENTINEL LYMPH NODE BIOPSY WITH BLUE DYE INJECTION;  Surgeon: Rolm Bookbinder, MD;  Location: [place];  Service: General;  Laterality: Left;  . SKIN CANCER EXCISION Left    eye brow    There were no vitals filed for this visit.      Subjective Assessment - 10/19/16 1418    Subjective My husband's surgery is tomorrow morning at 7:15. My tightness is still there. We are just going to have to work  with it. That's all. 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  Recurrence 6/18  with left mastectomy and sentinel node bioplsy on 08/18/2016. She says the doctor said her had to "scrape my ribs".  No chemo or radiation is needed at this time  IBS since 1980.   Patient Stated Goals Move arm normally   Currently in Pain? No/denies   Pain Score 0-No pain            [facility] PT Assessment - 10/19/16 0001      AROM   Left Shoulder ABduction 127 Degrees  140 after PROM                     [facility] Adult PT Treatment/Exercise - 10/19/16 0001      Manual Therapy   Soft tissue mobilization very gently to area surrounding lateral aspect of mastectomy incision (away from wound), and to area of tightness in area inferior and superior to mastectomy scar   Myofascial Release gentle cross hand to mastectomy scar in supine avoiding wound   Manual Lymphatic Drainage (MLD) in supine 5 diaphragmatic breaths, right axillary nodes, anterior interaxillary anastamosis, left inguinal nodes and establishment of axillo inguinal anastomosis,  moved fluid superior to mastectomy scar towards interaxillary pathway and fluid inferior towards axillo inguinal pathway   Passive ROM in supine to left shoulder into er, abduction, and flexion                        Long Term Clinic Goals - 10/19/16 1419      CC Long Term Goal  #1   Title Patient will verbalize understanding of lymphedema risk reduction practices.   Baseline 10/19/16- issued pt a handout about this   Time 8   Period Weeks   Status On-going     CC Long Term Goal  #2   Title Increase left shoulder active flexion to >/= 120 degrees to increased ease reaching and performing daily tasks.   Time 8   Period Weeks   Status Achieved     CC Long Term Goal  #3   Title Increase left shoulder active abduction to >/= 120 degrees to increased ease  reaching and performing daily tasks.   Baseline 09/27/16- 114 degrees, 10/19/16- 127 degrees, 140 degrees after PROM   Time 4   Period Weeks   Status Achieved     CC Long Term Goal  #4   Title Patient will report she has returned to >/= 50% of normal household chores including cooking and cleaning.   Baseline pt says she has only returned to 25% she doing some cooking and some laundry.  still not able to do cleaning, 10/19/16-40%    Time 8   Period Weeks   Status On-going     CC Long Term Goal  #5   Title Pt will be independent in a home exercise program    Time 8   Period Weeks   Status On-going            Plan - 10/19/16 1504    Clinical Impression Statement Pt is still anxious about husband's surgery tomorrow morning. She is still having increased tightness around mastectomy scar limiting ROM. Pt encouraged to perform self scar massage to this area. Continued with myofascial and soft tissue mobilization to area around scar. Wound still has small adherent eschar at lateral aspect with no drainage.    Rehab Potential Good   Clinical Impairments Affecting Rehab Potential delayed healing on chest wound    PT Frequency 2x / week   PT Duration 8 weeks   PT Treatment/Interventions ADLs/Self Care Home Management;Patient/family education;Manual techniques;Passive range of motion;Therapeutic activities;Taping;Neuromuscular re-education;Therapeutic exercise;Manual lymph drainage;Scar mobilization   PT Next Visit Plan see if pt has scheduled appt to get compression garments, continue with shoulder ROM as tolerated with caution around healing incision. Continue with MLD as needed. Review home exercise and add progressive strengthening    PT Home Exercise Plan ask about tightness since pt has been performing seated trunk rotation stretch, assess left anterior chest swelling and if it persists at MLD to POC next visit, Shoulder ROM; add lower trunk rotation   Consulted and Agree with Plan of Care  Patient      Patient will benefit from skilled therapeutic intervention in order to improve the following deficits and impairments:  Decreased skin integrity, Decreased scar mobility, Decreased knowledge of precautions, Pain, Impaired UE functional use, Increased fascial restricitons, Decreased strength, Decreased range of motion, Postural dysfunction  Visit Diagnosis: Stiffness of left shoulder, not elsewhere classified  Abnormal posture     Problem List Patient Active Problem List   Diagnosis Date Noted  .  Breast neoplasm, Tis (DCIS), 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  . Asthma 01/26/2016  . PNA (pneumonia) 04/11/2014  . Hyperlipemia 04/11/2014  . 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    Allyson Sabal [facility] 10/19/2016, 3:07 PM  [place], [place], [address] Phone: [phone]   Fax:  [phone]  Name: Quinn Bartling MRN: [id number] Date of Birth: 1945-04-26  Manus Gunning, PT 10/19/16 3:07 PM

## 2016-10-20 ENCOUNTER — Encounter: Payer: Self-pay | Admitting: Physical Therapy

## 2016-10-25 ENCOUNTER — Ambulatory Visit: Payer: Medicare Other | Admitting: Physical Therapy

## 2016-10-25 DIAGNOSIS — Z483 Aftercare following surgery for neoplasm: Secondary | ICD-10-CM | POA: Diagnosis not present

## 2016-10-25 DIAGNOSIS — M6281 Muscle weakness (generalized): Secondary | ICD-10-CM

## 2016-10-25 DIAGNOSIS — M25612 Stiffness of left shoulder, not elsewhere classified: Secondary | ICD-10-CM | POA: Diagnosis not present

## 2016-10-25 DIAGNOSIS — R293 Abnormal posture: Secondary | ICD-10-CM

## 2016-10-25 NOTE — Therapy (Signed)
[place], [place], [address] Phone: [phone]   Fax:  [phone]  Physical Therapy Treatment  Patient Details  Name: Colleen Hancock MRN: [id number] Date of Birth: 1945/10/12 Referring Provider: Dr. Rolm Bookbinder  Encounter Date: 10/25/2016      PT End of Session - 10/25/16 1733    Visit Number 13   Number of Visits 24   Date for PT Re-Evaluation 11/30/17   PT Start Time 2993   PT Stop Time 1604   PT Time Calculation (min) 49 min   Activity Tolerance Patient tolerated treatment well   Behavior During Therapy [facility] for tasks assessed/performed      Past Medical History:  Diagnosis Date  . Anxiety   . Arthritis    hands, knees  . Asthma    borderine  . Breast CA ([place]) 10/2012   Left Breast  . Complication of anesthesia    during colonoscopy fentanyl dropped blood pressure  . Dyspnea    exertion  . GERD (gastroesophageal reflux disease)   . Heart murmur   . High cholesterol   . IBS (irritable bowel syndrome)     Past Surgical History:  Procedure Laterality Date  . BREAST BIOPSY Left 10/2012   Stereo- Malignant  . BREAST BIOPSY Left    U/S Core- Benign  . BREAST EXCISIONAL BIOPSY Left   . BREAST EXCISIONAL BIOPSY Left   . BREAST LUMPECTOMY Left 11/2012  . CHOLECYSTECTOMY    . COLONOSCOPY    . DILATION AND CURETTAGE OF UTERUS  1967  . MASTECTOMY W/ SENTINEL NODE BIOPSY Left 08/18/2016  . MASTECTOMY W/ SENTINEL NODE BIOPSY Left 08/18/2016   Procedure: LEFT TOTAL MASTECTOMY WITH SENTINEL LYMPH NODE BIOPSY WITH BLUE DYE INJECTION;  Surgeon: Rolm Bookbinder, MD;  Location: [place];  Service: General;  Laterality: Left;  . SKIN CANCER EXCISION Left    eye brow    There were no vitals filed for this visit.      Subjective Assessment - 10/25/16 1522    Subjective "Its very tight"  Pt has been taking care of her husband at home since he had surgery last week.  She was not able  to get PT assist for him and so pt has had to lift him some.  She comes in weaing a compression bra. but it is asymmetrical and when she moves her arm the full part in front of her axilla comes forward and sits in front of and top of the bra.    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  Recurrence 6/18  with left mastectomy and sentinel node bioplsy on 08/18/2016. She says the doctor said her had to "scrape my ribs".  No chemo or radiation is needed at this time  IBS since 1980.   Patient Stated Goals Move arm normally                         [facility] Adult PT Treatment/Exercise - 10/25/16 0001      Shoulder Exercises: Supine   Flexion AROM;Left;5 reps   Other Supine Exercises 5 small circles with hand pointed to ceiling.      Shoulder Exercises: Sidelying   ABduction AROM;Left;5 reps   Other Sidelying Exercises 5 small circles with hand pointing to ceiling      Manual Therapy   Soft tissue mobilization very gently to area surrounding lateral aspect of  mastectomy incision (away from wound), and to area of tightness in area inferior and superior to mastectomy scar and at posterior axilla and scapular area    Myofascial Release to left lateral chest at area of tightness in [facility] Goals - 10/19/16 1419      CC Long Term Goal  #1   Title Patient will verbalize understanding of lymphedema risk reduction practices.   Baseline 10/19/16- issued pt a handout about this   Time 8   Period Weeks   Status On-going     CC Long Term Goal  #2   Title Increase left shoulder active flexion to >/= 120 degrees to increased ease reaching and performing daily tasks.   Time 8   Period Weeks   Status Achieved     CC Long Term Goal  #3   Title Increase left shoulder active abduction to >/= 120 degrees to increased ease reaching and performing daily tasks.   Baseline  09/27/16- 114 degrees, 10/19/16- 127 degrees, 140 degrees after PROM   Time 4   Period Weeks   Status Achieved     CC Long Term Goal  #4   Title Patient will report she has returned to >/= 50% of normal household chores including cooking and cleaning.   Baseline pt says she has only returned to 25% she doing some cooking and some laundry.  still not able to do cleaning, 10/19/16-40%    Time 8   Period Weeks   Status On-going     CC Long Term Goal  #5   Title Pt will be independent in a home exercise program    Time 8   Period Weeks   Status On-going            Plan - 10/25/16 1734    Clinical Impression Statement Pt experienced  a cramp at right rib area with soft tissue work at back and also with moving from supine to sidelying.  It was relieved when she burped when she sat up. Pt is ready to progress to isometric strengthening.  Pt will get compression garments on Sept 9 so will hold off on working on fullness at anterior chest until after that.    PT Frequency 2x / week   PT Duration 8 weeks   PT Treatment/Interventions ADLs/Self Care Home Management;Patient/family education;Manual techniques;Passive range of motion;Therapeutic activities;Taping;Neuromuscular re-education;Therapeutic exercise;Manual lymph drainage;Scar mobilization   PT Next Visit Plan Progress to isometric strengthening and continue with pulleys and shoulder ROM as tolerated with caution around healing incision. Continue with MLD as needed and soft tissue work to posterior shoulder and around scapula    Consulted and Agree with Plan of Care Patient      Patient will benefit from skilled therapeutic intervention in order to improve the following deficits and impairments:  Decreased skin integrity, Decreased scar mobility, Decreased knowledge of precautions, Pain, Impaired UE functional use, Increased fascial restricitons, Decreased strength, Decreased range of motion, Postural dysfunction  Visit  Diagnosis: Stiffness of left shoulder, not elsewhere classified  Abnormal posture  Aftercare following surgery for neoplasm  Muscle weakness (generalized)     Problem List Patient Active Problem List   Diagnosis Date Noted  . Breast neoplasm, Tis (DCIS), 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  . Asthma 01/26/2016  . PNA (pneumonia) 04/11/2014  . Hyperlipemia 04/11/2014  . Ductal carcinoma in  situ (DCIS) of left breast 04/11/2014   Colleen Hancock PT  Colleen Hancock 10/25/2016, 5:39 PM  [place], [place], [address] Phone: [phone]   Fax:  [phone]  Name: Colleen Hancock MRN: [id number] Date of Birth: March 07, 1946

## 2016-10-27 ENCOUNTER — Ambulatory Visit: Payer: Medicare Other | Admitting: Physical Therapy

## 2016-11-01 ENCOUNTER — Ambulatory Visit: Payer: Medicare Other | Admitting: Physical Therapy

## 2016-11-01 DIAGNOSIS — M25612 Stiffness of left shoulder, not elsewhere classified: Secondary | ICD-10-CM | POA: Diagnosis not present

## 2016-11-01 DIAGNOSIS — R293 Abnormal posture: Secondary | ICD-10-CM | POA: Diagnosis not present

## 2016-11-01 DIAGNOSIS — M6281 Muscle weakness (generalized): Secondary | ICD-10-CM | POA: Diagnosis not present

## 2016-11-01 DIAGNOSIS — Z483 Aftercare following surgery for neoplasm: Secondary | ICD-10-CM | POA: Diagnosis not present

## 2016-11-01 NOTE — Therapy (Signed)
[place], [place], [address] Phone: [phone]   Fax:  [phone]  Physical Therapy Treatment  Patient Details  Name: Colleen Hancock MRN: [id number] Date of Birth: Sep 06, 1945 Referring Provider: Dr. Rolm Bookbinder  Encounter Date: 11/01/2016      PT End of Session - 11/01/16 1507    Visit Number 13   Number of Visits 24   Date for PT Re-Evaluation 11/30/17   PT Start Time 1430   PT Stop Time 1508   PT Time Calculation (min) 38 min   Activity Tolerance Patient tolerated treatment well   Behavior During Therapy [facility] for tasks assessed/performed      Past Medical History:  Diagnosis Date  . Anxiety   . Arthritis    hands, knees  . Asthma    borderine  . Breast CA ([place]) 10/2012   Left Breast  . Complication of anesthesia    during colonoscopy fentanyl dropped blood pressure  . Dyspnea    exertion  . GERD (gastroesophageal reflux disease)   . Heart murmur   . High cholesterol   . IBS (irritable bowel syndrome)     Past Surgical History:  Procedure Laterality Date  . BREAST BIOPSY Left 10/2012   Stereo- Malignant  . BREAST BIOPSY Left    U/S Core- Benign  . BREAST EXCISIONAL BIOPSY Left   . BREAST EXCISIONAL BIOPSY Left   . BREAST LUMPECTOMY Left 11/2012  . CHOLECYSTECTOMY    . COLONOSCOPY    . DILATION AND CURETTAGE OF UTERUS  1967  . MASTECTOMY W/ SENTINEL NODE BIOPSY Left 08/18/2016  . MASTECTOMY W/ SENTINEL NODE BIOPSY Left 08/18/2016   Procedure: LEFT TOTAL MASTECTOMY WITH SENTINEL LYMPH NODE BIOPSY WITH BLUE DYE INJECTION;  Surgeon: Rolm Bookbinder, MD;  Location: [place];  Service: General;  Laterality: Left;  . SKIN CANCER EXCISION Left    eye brow    There were no vitals filed for this visit.      Subjective Assessment - 11/01/16 1435    Subjective Pt reports she had some pain yesterday but does not have pain today. 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  Recurrence 6/18  with left mastectomy and sentinel node bioplsy on 08/18/2016. She says the doctor said her had to "scrape my ribs".  No chemo or radiation is needed at this time  IBS since 1980.   Patient Stated Goals Move arm normally   Currently in Pain? No/denies            [facility] PT Assessment - 11/01/16 0001      AROM   Left Shoulder Flexion 155 Degrees   Left Shoulder ABduction 155 Degrees                     [facility] Adult PT Treatment/Exercise - 11/01/16 0001      Knee/Hip Exercises: Standing   Forward Step Up Right;Left;5 reps   Forward Step Up Limitations pt needed to stop due to pain in right calf at site of old Achilles injury      Shoulder Exercises: Standing   Row Strengthening;Both;10 reps;20 reps  low   Other Standing Exercises yellow ball up the wall for shoulder stretching x 10      Shoulder Exercises: Pulleys   Flexion 2 minutes  verbal  cues    ABduction 2 minutes     Shoulder Exercises: Isometric Strengthening   Flexion 5X5"   Flexion Limitations standing    Extension 3X5"   Extension Limitations standing    ABduction 3X5"   ABduction Limitations standing                 PT Education - 11/01/16 1505    Education Details isometrics and low rows with red theraband    Person(s) Educated Patient   Methods Explanation;Demonstration;Handout   Comprehension Verbalized understanding;Returned demonstration                [facility] Goals - 11/01/16 1512      CC Long Term Goal  #1   Title Patient will verbalize understanding of lymphedema risk reduction practices.   Baseline 10/19/16- issued pt a handout about this   Time 8   Period Weeks   Status On-going     CC Long Term Goal  #2   Title Increase left shoulder active flexion to >/= 120 degrees to increased ease  reaching and performing daily tasks.   Baseline 155 on 11/01/2016   Status Achieved     CC Long Term Goal  #3   Title Increase left shoulder active abduction to >/= 120 degrees to increased ease reaching and performing daily tasks.   Baseline 09/27/16- 114 degrees, 10/19/16- 127 degrees, 140 degrees after PROM, 155 on 11/01/2016    Status Achieved     CC Long Term Goal  #4   Title Patient will report she has returned to >/= 50% of normal household chores including cooking and cleaning.   Baseline pt says she has only returned to 25% she doing some cooking and some laundry.  still not able to do cleaning, 10/19/16-40%    Status On-going     CC Long Term Goal  #5   Title Pt will be independent in a home exercise program    Time 8   Period Weeks   Status On-going            Plan - 11/01/16 1508    Clinical Impression Statement Pt has improved with shoulder range of motion and has met goals for this.  She still has cramping if she has prolonged movment or prolonged stretch.  Upgraded her program to include strengthening today and pt was able to do well with them with verbal cues to keep from leaning into wall.  She feels that she will be able to follow through at home.    Clinical Impairments Affecting Rehab Potential delayed healing on chest wound    PT Treatment/Interventions ADLs/Self Care Home Management;Patient/family education;Manual techniques;Passive range of motion;Therapeutic activities;Taping;Neuromuscular re-education;Therapeutic exercise;Manual lymph drainage;Scar mobilization   PT Next Visit Plan Review isometric strengthening and continue with pulleys and shoulder ROM as tolerated with caution around healing incision. Continue with core stretching/strengthening and MLD as needed and soft tissue work to posterior shoulder and around scapula    Consulted and Agree with Plan of Care Patient      Patient will benefit from skilled therapeutic intervention in order to improve the  following deficits and impairments:  Decreased skin integrity, Decreased scar mobility, Decreased knowledge of precautions, Pain, Impaired UE functional use, Increased fascial restricitons, Decreased strength, Decreased range of motion, Postural dysfunction  Visit Diagnosis: Stiffness of left shoulder, not elsewhere classified  Abnormal posture  Aftercare following surgery for neoplasm  Muscle weakness (generalized)     Problem List Patient Active  Problem List   Diagnosis Date Noted  . Breast neoplasm, Tis (DCIS), 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  . Asthma 01/26/2016  . PNA (pneumonia) 04/11/2014  . Hyperlipemia 04/11/2014  . 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   Donato Heinz. Owens Shark PT  [name] 11/01/2016, 3:13 PM  [place], [place], [address] Phone: [phone]   Fax:  [phone]  Name: Colleen Hancock MRN: [id number] Date of Birth: February 03, 1946

## 2016-11-01 NOTE — Patient Instructions (Signed)

## 2016-11-03 ENCOUNTER — Ambulatory Visit: Payer: Medicare Other | Admitting: Physical Therapy

## 2016-11-03 DIAGNOSIS — M6281 Muscle weakness (generalized): Secondary | ICD-10-CM | POA: Diagnosis not present

## 2016-11-03 DIAGNOSIS — R293 Abnormal posture: Secondary | ICD-10-CM

## 2016-11-03 DIAGNOSIS — M25612 Stiffness of left shoulder, not elsewhere classified: Secondary | ICD-10-CM | POA: Diagnosis not present

## 2016-11-03 DIAGNOSIS — Z483 Aftercare following surgery for neoplasm: Secondary | ICD-10-CM

## 2016-11-03 NOTE — Patient Instructions (Signed)

## 2016-11-03 NOTE — Therapy (Signed)
[place], [place], [address] Phone: [phone]   Fax:  [phone]  Physical Therapy Treatment  Patient Details  Name: Colleen Hancock MRN: [id number] Date of Birth: 12-28-1945 Referring Provider: Dr. Rolm Bookbinder  Encounter Date: 11/03/2016      PT End of Session - 11/03/16 1426    Visit Number 14   Number of Visits 24   Date for PT Re-Evaluation 11/30/17   PT Start Time 1426   PT Stop Time 1511   PT Time Calculation (min) 45 min   Activity Tolerance Patient tolerated treatment well   Behavior During Therapy [facility] for tasks assessed/performed      Past Medical History:  Diagnosis Date  . Anxiety   . Arthritis    hands, knees  . Asthma    borderine  . Breast CA ([place]) 10/2012   Left Breast  . Complication of anesthesia    during colonoscopy fentanyl dropped blood pressure  . Dyspnea    exertion  . GERD (gastroesophageal reflux disease)   . Heart murmur   . High cholesterol   . IBS (irritable bowel syndrome)     Past Surgical History:  Procedure Laterality Date  . BREAST BIOPSY Left 10/2012   Stereo- Malignant  . BREAST BIOPSY Left    U/S Core- Benign  . BREAST EXCISIONAL BIOPSY Left   . BREAST EXCISIONAL BIOPSY Left   . BREAST LUMPECTOMY Left 11/2012  . CHOLECYSTECTOMY    . COLONOSCOPY    . DILATION AND CURETTAGE OF UTERUS  1967  . MASTECTOMY W/ SENTINEL NODE BIOPSY Left 08/18/2016  . MASTECTOMY W/ SENTINEL NODE BIOPSY Left 08/18/2016   Procedure: LEFT TOTAL MASTECTOMY WITH SENTINEL LYMPH NODE BIOPSY WITH BLUE DYE INJECTION;  Surgeon: Rolm Bookbinder, MD;  Location: [place];  Service: General;  Laterality: Left;  . SKIN CANCER EXCISION Left    eye brow    There were no vitals filed for this visit.      Subjective Assessment - 11/03/16 1427    Subjective Pt reports she is tired today. Her husband went to the doctor yesterday and is now able to bear weight on his leg  and walk    Patient Stated Goals Move arm normally   Currently in Pain? No/denies  Pt had back to back charley horses today                          [facility] Adult PT Treatment/Exercise - 11/03/16 0001      Elbow Exercises   Elbow Flexion Strengthening;10 reps   Elbow Extension Strengthening;Right;Left   Elbow Extension Limitations right arm with 2#, left arm with no weight     Lumbar Exercises: Supine   Ab Set 5 reps   Clam 10 reps   Bent Knee Raise 10 reps   Bridge 10 reps   Straight Leg Raise 5 reps   Straight Leg Raises Limitations squeeze, lift, lower, relax     Knee/Hip Exercises: Supine   Heel Slides Strengthening;Right;Left;10 reps   Terminal Knee Extension Strengthening;Right;Left;10 reps     Knee/Hip Exercises: Sidelying   Hip ABduction Strengthening;Right;Left;5 reps   Clams 5 reps on each side      Shoulder Exercises: Sidelying   ABduction AROM;Left;5 reps   Other Sidelying Exercises 5 small circles with hand pointing to ceiling    Other Sidelying Exercises horizontal abduction with upper thoracic rotation to left  Manual Therapy   Manual Lymphatic Drainage (MLD) briefly, manual lymph drainage to right lateral and anterior chest with myofascial release and scar mobilization techniques                 PT Education - 11/03/16 1511    Education provided Yes   Education Details abdominal exercises    Person(s) Educated Patient   Methods Explanation;Demonstration;Handout;Tactile cues;Verbal cues   Comprehension Verbalized understanding;Returned demonstration                [facility] Goals - 11/01/16 1512      CC Long Term Goal  #1   Title Patient will verbalize understanding of lymphedema risk reduction practices.   Baseline 10/19/16- issued pt a handout about this   Time 8   Period Weeks   Status On-going     CC Long Term Goal  #2   Title Increase left shoulder active flexion to >/= 120 degrees to increased ease  reaching and performing daily tasks.   Baseline 155 on 11/01/2016   Status Achieved     CC Long Term Goal  #3   Title Increase left shoulder active abduction to >/= 120 degrees to increased ease reaching and performing daily tasks.   Baseline 09/27/16- 114 degrees, 10/19/16- 127 degrees, 140 degrees after PROM, 155 on 11/01/2016    Status Achieved     CC Long Term Goal  #4   Title Patient will report she has returned to >/= 50% of normal household chores including cooking and cleaning.   Baseline pt says she has only returned to 25% she doing some cooking and some laundry.  still not able to do cleaning, 10/19/16-40%    Status On-going     CC Long Term Goal  #5   Title Pt will be independent in a home exercise program    Time 8   Period Weeks   Status On-going            Plan - 11/03/16 1512    Clinical Impression Statement Pt continues to improve, but occasionally is limited by muscle cramping in abdominals and back.  Upgraded home exercise to include core strengthening.  Added LE exercise for general strengthening also.  Pt has started to use her treadmill at home    Rehab Potential Good   Clinical Impairments Affecting Rehab Potential delayed healing on chest wound    PT Frequency 2x / week   PT Duration 8 weeks   PT Next Visit Plan Review home exercise continue with pulleys and shoulder ROM as tolerated with caution around healing incision. Continue with core stretching/strengthening and LE strngthening and MLD as needed and soft tissue work to posterior shoulder and around scapula    Consulted and Agree with Plan of Care Patient      Patient will benefit from skilled therapeutic intervention in order to improve the following deficits and impairments:  Decreased skin integrity, Decreased scar mobility, Decreased knowledge of precautions, Pain, Impaired UE functional use, Increased fascial restricitons, Decreased strength, Decreased range of motion, Postural dysfunction  Visit  Diagnosis: Stiffness of left shoulder, not elsewhere classified  Abnormal posture  Aftercare following surgery for neoplasm  Muscle weakness (generalized)     Problem List Patient Active Problem List   Diagnosis Date Noted  . Breast neoplasm, Tis (DCIS), 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  . Asthma 01/26/2016  . PNA (pneumonia) 04/11/2014  . Hyperlipemia 04/11/2014  . 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  Donato Heinz. Owens Shark PT  [name] 11/03/2016, 3:15 PM  [place], [place], [address] Phone: [phone]   Fax:  [phone]  Name: Colleen Hancock MRN: [id number] Date of Birth: 1945/09/20

## 2016-11-08 ENCOUNTER — Ambulatory Visit: Payer: Medicare Other | Attending: General Surgery | Admitting: Physical Therapy

## 2016-11-08 DIAGNOSIS — Z483 Aftercare following surgery for neoplasm: Secondary | ICD-10-CM | POA: Insufficient documentation

## 2016-11-08 DIAGNOSIS — M6281 Muscle weakness (generalized): Secondary | ICD-10-CM | POA: Diagnosis not present

## 2016-11-08 DIAGNOSIS — R293 Abnormal posture: Secondary | ICD-10-CM | POA: Insufficient documentation

## 2016-11-08 DIAGNOSIS — M25612 Stiffness of left shoulder, not elsewhere classified: Secondary | ICD-10-CM | POA: Diagnosis not present

## 2016-11-08 NOTE — Therapy (Signed)
[place], [place], [address] Phone: [phone]   Fax:  [phone]  Physical Therapy Treatment  Patient Details  Name: Colleen Hancock MRN: [id number] Date of Birth: 12-22-1945 Referring Provider: Dr. Rolm Bookbinder  Encounter Date: 11/08/2016      PT End of Session - 11/08/16 1202    Visit Number 15   Number of Visits 24   Date for PT Re-Evaluation 11/30/17   PT Start Time 2878   PT Stop Time 1100   PT Time Calculation (min) 45 min   Activity Tolerance Patient tolerated treatment well   Behavior During Therapy [facility] for tasks assessed/performed      Past Medical History:  Diagnosis Date  . Anxiety   . Arthritis    hands, knees  . Asthma    borderine  . Breast CA ([place]) 10/2012   Left Breast  . Complication of anesthesia    during colonoscopy fentanyl dropped blood pressure  . Dyspnea    exertion  . GERD (gastroesophageal reflux disease)   . Heart murmur   . High cholesterol   . IBS (irritable bowel syndrome)     Past Surgical History:  Procedure Laterality Date  . BREAST BIOPSY Left 10/2012   Stereo- Malignant  . BREAST BIOPSY Left    U/S Core- Benign  . BREAST EXCISIONAL BIOPSY Left   . BREAST EXCISIONAL BIOPSY Left   . BREAST LUMPECTOMY Left 11/2012  . CHOLECYSTECTOMY    . COLONOSCOPY    . DILATION AND CURETTAGE OF UTERUS  1967  . MASTECTOMY W/ SENTINEL NODE BIOPSY Left 08/18/2016  . MASTECTOMY W/ SENTINEL NODE BIOPSY Left 08/18/2016   Procedure: LEFT TOTAL MASTECTOMY WITH SENTINEL LYMPH NODE BIOPSY WITH BLUE DYE INJECTION;  Surgeon: Rolm Bookbinder, MD;  Location: [place];  Service: General;  Laterality: Left;  . SKIN CANCER EXCISION Left    eye brow    There were no vitals filed for this visit.      Subjective Assessment - 11/08/16 1024    Subjective Pt is upset about her grandson.  Her husband is nauseous today and is still having a little bleeding.  He is moving  around better at home. 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  Recurrence 6/18  with left mastectomy and sentinel node bioplsy on 08/18/2016. She says the doctor said her had to "scrape my ribs".  No chemo or radiation is needed at this time  IBS since 1980.   Patient Stated Goals Move arm normally   Currently in Pain? No/denies                         [facility] Adult PT Treatment/Exercise - 11/08/16 0001      Self-Care   Self-Care Other Self-Care Comments   Other Self-Care Comments  discussed options for postmastectomy bras and gave pt contact card for knitted knockers     Elbow Exercises   Elbow Flexion Strengthening;Right;Left;10 reps;Standing;Bar weights/barbell   Bar Weights/Barbell (Elbow Flexion) 2 lbs     Lumbar Exercises: Supine   Clam 10 reps   Heel Slides 10 reps   Bent Knee Raise 10 reps   Straight Leg Raise 5 reps   Straight Leg Raises Limitations squeeze, lift, lower, relax  Knee/Hip Exercises: Standing   Forward Step Up 10 reps;Hand Hold: 1;Step Height: 6"  dowel rod for balance     Knee/Hip Exercises: Sidelying   Hip ABduction Strengthening;Right;Left;10 reps   Clams 10 reps on each side      Shoulder Exercises: Standing   Flexion Strengthening;Right;Left;10 reps;Weights   Shoulder Flexion Weight (lbs) 2   ABduction Strengthening;Right;Left;10 reps;Weights   Shoulder ABduction Weight (lbs) 2   Row Strengthening;Both;10 reps;20 reps  low   Theraband Level (Shoulder Row) Level 2 (Red)   Other Standing Exercises yellow ball up the wall for shoulder stretching x 10    Other Standing Exercises minisquat with scapular retraction      Shoulder Exercises: Pulleys   Flexion 2 minutes  verbal cues    Flexion Limitations cued to do opposition with left hip pushing down on chair                          Long Term Clinic Goals - 11/01/16 1512      CC Long Term Goal  #1   Title Patient will verbalize understanding of lymphedema risk reduction practices.   Baseline 10/19/16- issued pt a handout about this   Time 8   Period Weeks   Status On-going     CC Long Term Goal  #2   Title Increase left shoulder active flexion to >/= 120 degrees to increased ease reaching and performing daily tasks.   Baseline 155 on 11/01/2016   Status Achieved     CC Long Term Goal  #3   Title Increase left shoulder active abduction to >/= 120 degrees to increased ease reaching and performing daily tasks.   Baseline 09/27/16- 114 degrees, 10/19/16- 127 degrees, 140 degrees after PROM, 155 on 11/01/2016    Status Achieved     CC Long Term Goal  #4   Title Patient will report she has returned to >/= 50% of normal household chores including cooking and cleaning.   Baseline pt says she has only returned to 25% she doing some cooking and some laundry.  still not able to do cleaning, 10/19/16-40%    Status On-going     CC Long Term Goal  #5   Title Pt will be independent in a home exercise program    Time 8   Period Weeks   Status On-going            Plan - 11/08/16 1202    Clinical Impression Statement Pt appears to be improving in general strength, though she complains about diffuse pain.  She is axious to get a post mastectomy bra that she will find comfortable.    Rehab Potential Good   Clinical Impairments Affecting Rehab Potential delayed healing on chest wound    PT Frequency 2x / week   PT Duration 8 weeks   PT Treatment/Interventions ADLs/Self Care Home Management;Patient/family education;Manual techniques;Passive range of motion;Therapeutic activities;Taping;Neuromuscular re-education;Therapeutic exercise;Manual lymph drainage;Scar mobilization   PT Next Visit Plan Review home exercise continue with pulleys and shoulder ROM as tolerated with caution around  healing incision. Continue with core stretching/strengthening and LE strngthening as she has been doing.  consider teaching Strength ABC program  and MLD as needed and soft tissue work to posterior shoulder and around scapula    Consulted and Agree with Plan of Care Patient      Patient will benefit from skilled therapeutic intervention in order to improve the following deficits and impairments:  Decreased skin integrity, Decreased scar mobility, Decreased knowledge of precautions, Pain, Impaired UE functional use, Increased fascial restricitons, Decreased strength, Decreased range of motion, Postural dysfunction  Visit Diagnosis: Stiffness of left shoulder, not elsewhere classified  Abnormal posture  Aftercare following surgery for neoplasm  Muscle weakness (generalized)     Problem List Patient Active Problem List   Diagnosis Date Noted  . Breast neoplasm, Tis (DCIS), 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  . Asthma 01/26/2016  . PNA (pneumonia) 04/11/2014  . Hyperlipemia 04/11/2014  . 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   Donato Heinz. Owens Shark PT  [name] 11/08/2016, 12:05 PM  [place], [place], [address] Phone: [phone]   Fax:  [phone]  Name: Colleen Hancock MRN: [id number] Date of Birth: 1945/07/15

## 2016-11-10 ENCOUNTER — Ambulatory Visit: Payer: Medicare Other | Admitting: Physical Therapy

## 2016-11-10 DIAGNOSIS — M25612 Stiffness of left shoulder, not elsewhere classified: Secondary | ICD-10-CM

## 2016-11-10 DIAGNOSIS — M6281 Muscle weakness (generalized): Secondary | ICD-10-CM | POA: Diagnosis not present

## 2016-11-10 DIAGNOSIS — R293 Abnormal posture: Secondary | ICD-10-CM | POA: Diagnosis not present

## 2016-11-10 DIAGNOSIS — Z483 Aftercare following surgery for neoplasm: Secondary | ICD-10-CM | POA: Diagnosis not present

## 2016-11-10 NOTE — Patient Instructions (Signed)
Warrior I Pose    In wide stride stance, feet facing forward, bend right knee and extend left leg behind, heel elevated. Distribute weight equally between front foot and ball of back foot. Extend arms upward beside ears. Hold for 2___ breaths. Repeat with other leg forward. Repeat _2__ times, alternating legs.   Copyright  VHI. All rights reserved.

## 2016-11-10 NOTE — Therapy (Signed)
[place], [place], [address] Phone: [phone]   Fax:  [phone]  Physical Therapy Treatment  Patient Details  Name: Colleen Hancock MRN: [id number] Date of Birth: 12-Sep-1945 Referring Provider: Dr. Rolm Bookbinder  Encounter Date: 11/10/2016      PT End of Session - 11/10/16 1814    Visit Number 16   Number of Visits 24   Date for PT Re-Evaluation 11/30/17   PT Start Time 6734   PT Stop Time 1430   PT Time Calculation (min) 45 min   Activity Tolerance Patient tolerated treatment well   Behavior During Therapy [facility] for tasks assessed/performed      Past Medical History:  Diagnosis Date  . Anxiety   . Arthritis    hands, knees  . Asthma    borderine  . Breast CA ([place]) 10/2012   Left Breast  . Complication of anesthesia    during colonoscopy fentanyl dropped blood pressure  . Dyspnea    exertion  . GERD (gastroesophageal reflux disease)   . Heart murmur   . High cholesterol   . IBS (irritable bowel syndrome)     Past Surgical History:  Procedure Laterality Date  . BREAST BIOPSY Left 10/2012   Stereo- Malignant  . BREAST BIOPSY Left    U/S Core- Benign  . BREAST EXCISIONAL BIOPSY Left   . BREAST EXCISIONAL BIOPSY Left   . BREAST LUMPECTOMY Left 11/2012  . CHOLECYSTECTOMY    . COLONOSCOPY    . DILATION AND CURETTAGE OF UTERUS  1967  . MASTECTOMY W/ SENTINEL NODE BIOPSY Left 08/18/2016  . MASTECTOMY W/ SENTINEL NODE BIOPSY Left 08/18/2016   Procedure: LEFT TOTAL MASTECTOMY WITH SENTINEL LYMPH NODE BIOPSY WITH BLUE DYE INJECTION;  Surgeon: Rolm Bookbinder, MD;  Location: [place];  Service: General;  Laterality: Left;  . SKIN CANCER EXCISION Left    eye brow    There were no vitals filed for this visit.      Subjective Assessment - 11/10/16 1811    Subjective Pt states she feels better today.    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  Recurrence 6/18  with left mastectomy and sentinel node bioplsy on 08/18/2016. She says the doctor said her had to "scrape my ribs".  No chemo or radiation is needed at this time  IBS since 1980.   Patient Stated Goals Move arm normally   Currently in Pain? No/denies                         [facility] Adult PT Treatment/Exercise - 11/10/16 0001      Balance Poses: Yoga   Warrior I 2 reps  2 breaths on each side      Lumbar Exercises: Supine   Bridge 10 reps   Straight Leg Raise 5 reps   Straight Leg Raises Limitations squeeze, lift, lower, relax   Other Supine Lumbar Exercises lower trunk rotation      Knee/Hip Exercises: Standing   Other Standing Knee Exercises modification of quaduped opposite arm and leg extension by using a chair. pt was able to to raise right arm and left leg, but not raise left arm and right leg due to cramping pain in left side.  She was able to raise left leg only with no cramping      Knee/Hip Exercises: Sidelying   Clams 10 reps on each  side      Shoulder Exercises: Stretch   Other Shoulder Stretches Strenth ABC stretches                 PT Education - 11/10/16 1812    Education provided Yes   Education Details Began strength ABC program with modification   Person(s) Educated Patient   Methods Explanation;Demonstration   Comprehension Verbalized understanding;Returned demonstration;Need further instruction                [facility] Goals - 11/01/16 1512      CC Long Term Goal  #1   Title Patient will verbalize understanding of lymphedema risk reduction practices.   Baseline 10/19/16- issued pt a handout about this   Time 8   Period Weeks   Status On-going     CC Long Term Goal  #2   Title Increase left shoulder active flexion to >/= 120 degrees to increased ease reaching and performing daily tasks.   Baseline 155 on 11/01/2016   Status Achieved     CC Long Term Goal  #3   Title  Increase left shoulder active abduction to >/= 120 degrees to increased ease reaching and performing daily tasks.   Baseline 09/27/16- 114 degrees, 10/19/16- 127 degrees, 140 degrees after PROM, 155 on 11/01/2016    Status Achieved     CC Long Term Goal  #4   Title Patient will report she has returned to >/= 50% of normal household chores including cooking and cleaning.   Baseline pt says she has only returned to 25% she doing some cooking and some laundry.  still not able to do cleaning, 10/19/16-40%    Status On-going     CC Long Term Goal  #5   Title Pt will be independent in a home exercise program    Time 8   Period Weeks   Status On-going            Plan - 11/10/16 1422    Clinical Impression Statement Began instruction in Strength ABC program.  Pt not able to do LE stretching series due to hip decreased felxibility to substitued Warrior One yoga pose. Also unable today to do quaduped opposite arm and leg so did in in standing leaning on a chair.    Rehab Potential Good   Clinical Impairments Affecting Rehab Potential delayed healing on chest wound    PT Treatment/Interventions ADLs/Self Care Home Management;Patient/family education;Manual techniques;Passive range of motion;Therapeutic activities;Taping;Neuromuscular re-education;Therapeutic exercise;Manual lymph drainage;Scar mobilization   PT Next Visit Plan continue with instruction of Strength ABC program    Consulted and Agree with Plan of Care Patient      Patient will benefit from skilled therapeutic intervention in order to improve the following deficits and impairments:  Decreased skin integrity, Decreased scar mobility, Decreased knowledge of precautions, Pain, Impaired UE functional use, Increased fascial restricitons, Decreased strength, Decreased range of motion, Postural dysfunction  Visit Diagnosis: Stiffness of left shoulder, not elsewhere classified  Abnormal posture  Aftercare following surgery for  neoplasm  Muscle weakness (generalized)     Problem List Patient Active Problem List   Diagnosis Date Noted  . Breast neoplasm, Tis (DCIS), 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  . Asthma 01/26/2016  . PNA (pneumonia) 04/11/2014  . Hyperlipemia 04/11/2014  . 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   Donato Heinz. Owens Shark PT  [name] 11/10/2016, 6:18 PM  [place], [place], [address]  Phone: [phone]   Fax:  [phone]  Name: Jamesia Linnen MRN: [id number] Date of Birth: 11-25-1945

## 2016-11-15 ENCOUNTER — Ambulatory Visit: Payer: Medicare Other | Admitting: Physical Therapy

## 2016-11-15 DIAGNOSIS — Z483 Aftercare following surgery for neoplasm: Secondary | ICD-10-CM

## 2016-11-15 DIAGNOSIS — M25612 Stiffness of left shoulder, not elsewhere classified: Secondary | ICD-10-CM

## 2016-11-15 DIAGNOSIS — M6281 Muscle weakness (generalized): Secondary | ICD-10-CM

## 2016-11-15 DIAGNOSIS — R293 Abnormal posture: Secondary | ICD-10-CM

## 2016-11-15 NOTE — Therapy (Signed)
[place], [place], [address] Phone: [phone]   Fax:  [phone]  Physical Therapy Treatment  Patient Details  Name: Colleen Hancock MRN: [id number] Date of Birth: May 25, 1945 Referring Provider: Dr. Rolm Bookbinder  Encounter Date: 11/15/2016      PT End of Session - 11/15/16 1244    Visit Number 17   Number of Visits 24   Date for PT Re-Evaluation 11/30/17   PT Start Time 9326   PT Stop Time 1100   PT Time Calculation (min) 45 min   Activity Tolerance Patient tolerated treatment well   Behavior During Therapy [facility] for tasks assessed/performed      Past Medical History:  Diagnosis Date  . Anxiety   . Arthritis    hands, knees  . Asthma    borderine  . Breast CA ([place]) 10/2012   Left Breast  . Complication of anesthesia    during colonoscopy fentanyl dropped blood pressure  . Dyspnea    exertion  . GERD (gastroesophageal reflux disease)   . Heart murmur   . High cholesterol   . IBS (irritable bowel syndrome)     Past Surgical History:  Procedure Laterality Date  . BREAST BIOPSY Left 10/2012   Stereo- Malignant  . BREAST BIOPSY Left    U/S Core- Benign  . BREAST EXCISIONAL BIOPSY Left   . BREAST EXCISIONAL BIOPSY Left   . BREAST LUMPECTOMY Left 11/2012  . CHOLECYSTECTOMY    . COLONOSCOPY    . DILATION AND CURETTAGE OF UTERUS  1967  . MASTECTOMY W/ SENTINEL NODE BIOPSY Left 08/18/2016  . MASTECTOMY W/ SENTINEL NODE BIOPSY Left 08/18/2016   Procedure: LEFT TOTAL MASTECTOMY WITH SENTINEL LYMPH NODE BIOPSY WITH BLUE DYE INJECTION;  Surgeon: Rolm Bookbinder, MD;  Location: [place];  Service: General;  Laterality: Left;  . SKIN CANCER EXCISION Left    eye brow    There were no vitals filed for this visit.      Subjective Assessment - 11/15/16 1017    Subjective Pt states she feels better today. Her husband had a fall over the weekend and she tried to pull him up.  She had  to call the EMTs to pick him up.    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  Recurrence 6/18  with left mastectomy and sentinel node bioplsy on 08/18/2016. She says the doctor said her had to "scrape my ribs".  No chemo or radiation is needed at this time  IBS since 1980.   Patient Stated Goals Move arm normally   Currently in Pain? No/denies                         [facility] Adult PT Treatment/Exercise - 11/15/16 0001      Balance Poses: Yoga   Warrior I 2 reps  2 breaths on each side  needed verbal and tactile cues      Elbow Exercises   Elbow Flexion Strengthening;Right;Left;10 reps;Standing;Bar weights/barbell   Bar Weights/Barbell (Elbow Flexion) 1 lb   Elbow Extension Strengthening;Right;Left   Bar Weights/Barbell (Elbow Extension) 1 lb   Other elbow exercises countertop pushups x 5     Lumbar Exercises: Aerobic   Stationary Bike 5 minutes      Lumbar Exercises: Supine   Clam 10 reps   Bridge 10 reps   Straight Leg Raise 10 reps   Straight Leg  Raises Limitations squeeze, lift, lower, relax   Other Supine Lumbar Exercises lower trunk rotation  with arms out to side and goal post arms are painful      Lumbar Exercises: Quadruped   Other Quadruped Lumbar Exercises sepate arm and leg raise 5 times with each extremity as pt unable to lift both arm and leg   needed verbal and tactile cues, very difficult     Knee/Hip Exercises: Standing   Functional Squat 10 reps     Knee/Hip Exercises: Sidelying   Hip ABduction Strengthening;Right;Left;10 reps     Shoulder Exercises: Supine   Protraction Strengthening;Both;5 reps   Protraction Weight (lbs) 1   Other Supine Exercises 5 small circles with hand pointed to ceiling.  1/4 circle stop, full circle slow      Shoulder Exercises: Stretch   Other Shoulder Stretches Strenth ABC stretches  verbal cues and addition modified downward down dog                          [facility] Goals - 11/01/16 1512      CC Long Term Goal  #1   Title Patient will verbalize understanding of lymphedema risk reduction practices.   Baseline 10/19/16- issued pt a handout about this   Time 8   Period Weeks   Status On-going     CC Long Term Goal  #2   Title Increase left shoulder active flexion to >/= 120 degrees to increased ease reaching and performing daily tasks.   Baseline 155 on 11/01/2016   Status Achieved     CC Long Term Goal  #3   Title Increase left shoulder active abduction to >/= 120 degrees to increased ease reaching and performing daily tasks.   Baseline 09/27/16- 114 degrees, 10/19/16- 127 degrees, 140 degrees after PROM, 155 on 11/01/2016    Status Achieved     CC Long Term Goal  #4   Title Patient will report she has returned to >/= 50% of normal household chores including cooking and cleaning.   Baseline pt says she has only returned to 25% she doing some cooking and some laundry.  still not able to do cleaning, 10/19/16-40%    Status On-going     CC Long Term Goal  #5   Title Pt will be independent in a home exercise program    Time 8   Period Weeks   Status On-going            Plan - 11/15/16 1244    Clinical Impression Statement continued progression of strengthening program and pt did well, though c/o fatigue.  She is working hard and willing to push herself just outside of her comfort zone as in doing quaduped exercise.  She is going to get her compression bra today and is concerned about continues puffiness above her incision. She still has occasional cramps in side and back and demonstrates generalized muscle weakness,  but overall appears to be improving.  Last bit of eschar fell off her healed incision today    Clinical Impairments Affecting Rehab Potential delayed healing on chest wound    PT Next Visit Plan continue with review/  instruction of Strength ABC program including warm up on the  bike.  Progress as able    Consulted and Agree with Plan of Care Patient      Patient will benefit from skilled therapeutic intervention in order to improve the following deficits and impairments:  Decreased skin integrity, Decreased scar mobility, Decreased knowledge of precautions, Pain, Impaired UE functional use, Increased fascial restricitons, Decreased strength, Decreased range of motion, Postural dysfunction  Visit Diagnosis: Stiffness of left shoulder, not elsewhere classified  Abnormal posture  Aftercare following surgery for neoplasm  Muscle weakness (generalized)     Problem List Patient Active Problem List   Diagnosis Date Noted  . Breast neoplasm, Tis (DCIS), 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  . Asthma 01/26/2016  . PNA (pneumonia) 04/11/2014  . Hyperlipemia 04/11/2014  . 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   Donato Heinz. Owens Shark PT  [name] 11/15/2016, 12:48 PM  [place], [place], [address] Phone: [phone]   Fax:  [phone]  Name: Colleen Hancock MRN: [id number] Date of Birth: 1945-04-10

## 2016-11-17 ENCOUNTER — Ambulatory Visit: Payer: Medicare Other | Admitting: Physical Therapy

## 2016-11-17 DIAGNOSIS — Z483 Aftercare following surgery for neoplasm: Secondary | ICD-10-CM | POA: Diagnosis not present

## 2016-11-17 DIAGNOSIS — R293 Abnormal posture: Secondary | ICD-10-CM

## 2016-11-17 DIAGNOSIS — M6281 Muscle weakness (generalized): Secondary | ICD-10-CM

## 2016-11-17 DIAGNOSIS — M25612 Stiffness of left shoulder, not elsewhere classified: Secondary | ICD-10-CM

## 2016-11-17 NOTE — Therapy (Signed)
[place] H[place], [place], [address] Phone: [phone]   Fax:  [phone]  Physical Therapy Treatment  Patient Details  Name: Colleen Hancock MRN: [id number] Date of Birth: [birth year] Referring Provider: Dr. Rolm Bookbinder  Encounter Date: 11/17/2016      PT End of Session - 11/17/16 1209    Visit Number 18   Number of Visits 24   Date for PT Re-Evaluation 11/30/17   PT Start Time 1056   PT Stop Time 1145   PT Time Calculation (min) 49 min   Activity Tolerance Patient tolerated treatment well   Behavior During Therapy [facility] for tasks assessed/performed      Past Medical History:  Diagnosis Date  . Anxiety   . Arthritis    hands, knees  . Asthma    borderine  . Breast CA ([place]) 10/2012   Left Breast  . Complication of anesthesia    during colonoscopy fentanyl dropped blood pressure  . Dyspnea    exertion  . GERD (gastroesophageal reflux disease)   . Heart murmur   . High cholesterol   . IBS (irritable bowel syndrome)     Past Surgical History:  Procedure Laterality Date  . BREAST BIOPSY Left 10/2012   Stereo- Malignant  . BREAST BIOPSY Left    U/S Core- Benign  . BREAST EXCISIONAL BIOPSY Left   . BREAST EXCISIONAL BIOPSY Left   . BREAST LUMPECTOMY Left 11/2012  . CHOLECYSTECTOMY    . COLONOSCOPY    . DILATION AND CURETTAGE OF UTERUS  1967  . MASTECTOMY W/ SENTINEL NODE BIOPSY Left 08/18/2016  . MASTECTOMY W/ SENTINEL NODE BIOPSY Left 08/18/2016   Procedure: LEFT TOTAL MASTECTOMY WITH SENTINEL LYMPH NODE BIOPSY WITH BLUE DYE INJECTION;  Surgeon: Rolm Bookbinder, MD;  Location: [place];  Service: General;  Laterality: Left;  . SKIN CANCER EXCISION Left    eye brow    There were no vitals filed for this visit.      Subjective Assessment - 11/17/16 1108    Subjective Pt comes in today after having been to [facility] [place] and comes in with a Jodee bra and a prosthesis that she is  happy with.  She is getting used to wearing a bra again.  She has a few bras on order.    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  Recurrence 6/18  with left mastectomy and sentinel node bioplsy on 08/18/2016. She says the doctor said her had to "scrape my ribs".  No chemo or radiation is needed at this time  IBS since 1980.   Patient Stated Goals Move arm normally  11/17/2016.  Pt states she can almost do this.     Currently in Pain? No/denies                         [facility] Adult PT Treatment/Exercise - 11/17/16 0001      Self-Care   Self-Care Other Self-Care Comments   Other Self-Care Comments  checked compression bra and it appears to fit well. Pt is happy with fit.  Reviewd in detail lymphedema risk reduction precautions for goal achievement and pt reports she understands.  Issued pink lymphedema alert bracelet for possible use if she ever has to have another medical procedure      Shoulder Exercises: Stretch   Other Shoulder Stretches Strenth ABC stretches  verbal cues and addition modified  downward down dog  Reviewed Warrior one yoga pose with 2 breaths and performed 2 times on each side.  Pt needed rare tactile cues for balance                         [facility] Term Clinic Goals - 11/17/16 1129      CC Long Term Goal  #1   Title Patient will verbalize understanding of lymphedema risk reduction practices.   Baseline 10/19/16- issued pt a handout about this, 11/17/2016 reviewed again, pt will consider coming to ABC class    Status Achieved     CC Long Term Goal  #2   Title Increase left shoulder active flexion to >/= 120 degrees to increased ease reaching and performing daily tasks.   Baseline 155 on 11/01/2016   Status Achieved     CC Long Term Goal  #3   Title Increase left shoulder active abduction to >/= 120 degrees to increased ease reaching and performing daily tasks.   Baseline 09/27/16- 114  degrees, 10/19/16- 127 degrees, 140 degrees after PROM, 155 on 11/01/2016    Status Achieved     CC Long Term Goal  #4   Title Patient will report she has returned to >/= 50% of normal household chores including cooking and cleaning.   Baseline pt says she has only returned to 25% she doing some cooking and some laundry.  still not able to do cleaning, 10/19/16-40% .Acheived  pt is now getting down to clean shower.  Still has trouble lifting cases of water    Status Achieved     CC Long Term Goal  #5   Title Pt will be independent in a home exercise program    Time 8   Period Weeks   Status On-going            Plan - 11/17/16 1210    Clinical Impression Statement Pt continues to improve.  She has received her mastectomy bra and prosthesis and is pleased with the fit and cosmesis.  She will wear her foam pad that she has used previously under the bra strap at her anterior chest.  Reviewed risk  preacaution and performed goal assessment.  Will focus on review of Strength ABC program next few visits so she feels comforable with folllowing through with it at home. Pt will be ready to discharge this episode accoring to authorization period    Rehab Potential Good   Clinical Impairments Affecting Rehab Potential delayed healing on chest wound    PT Frequency 2x / week   PT Duration 8 weeks   PT Next Visit Plan continue with review/  instruction of Strength ABC program including warm up on the bike.  Plan for discharge by Sept 26       Patient will benefit from skilled therapeutic intervention in order to improve the following deficits and impairments:  Decreased skin integrity, Decreased scar mobility, Decreased knowledge of precautions, Pain, Impaired UE functional use, Increased fascial restricitons, Decreased strength, Decreased range of motion, Postural dysfunction  Visit Diagnosis: Stiffness of left shoulder, not elsewhere classified  Abnormal posture  Aftercare following surgery for  neoplasm  Muscle weakness (generalized)     Problem List Patient Active Problem List   Diagnosis Date Noted  . Breast neoplasm, Tis (DCIS), 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  . Asthma 01/26/2016  . PNA (pneumonia) 04/11/2014  . Hyperlipemia 04/11/2014  . 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  Donato Heinz. Owens Shark PT  [name] 11/17/2016, 12:14 PM  [place], [place], [address] Phone: [phone]   Fax:  [phone]  Name: Colleen Hancock MRN: [id number] Date of Birth: 02-03-1946

## 2016-11-17 NOTE — Patient Instructions (Signed)
Warrior I Pose    In wide stride stance, feet facing forward, bend right knee and extend left leg behind, heel elevated. Distribute weight equally between front foot and ball of back foot. Extend arms upward beside ears. Hold for ___ breaths. Repeat with other leg forward. Repeat ___ times, alternating legs. Do ___ times per day.  Copyright  VHI. All rights reserved.

## 2016-11-22 ENCOUNTER — Encounter: Payer: Self-pay | Admitting: Physical Therapy

## 2016-11-22 ENCOUNTER — Ambulatory Visit: Payer: Medicare Other | Admitting: Physical Therapy

## 2016-11-22 DIAGNOSIS — M6281 Muscle weakness (generalized): Secondary | ICD-10-CM

## 2016-11-22 DIAGNOSIS — M25612 Stiffness of left shoulder, not elsewhere classified: Secondary | ICD-10-CM

## 2016-11-22 DIAGNOSIS — R293 Abnormal posture: Secondary | ICD-10-CM

## 2016-11-22 DIAGNOSIS — Z483 Aftercare following surgery for neoplasm: Secondary | ICD-10-CM | POA: Diagnosis not present

## 2016-11-22 NOTE — Therapy (Signed)
[place], [place], [address] Phone: [phone]   Fax:  [phone]  Physical Therapy Treatment  Patient Details  Name: Colleen Hancock MRN: [id number] Date of Birth: 1946-01-30 Referring Provider: Dr. Rolm Bookbinder  Encounter Date: 11/22/2016      PT End of Session - 11/22/16 1204    Visit Number 19   Number of Visits 24   Date for PT Re-Evaluation 11/30/17   PT Start Time 1106   PT Stop Time 1150   PT Time Calculation (min) 44 min   Activity Tolerance Patient tolerated treatment well   Behavior During Therapy [facility] for tasks assessed/performed      Past Medical History:  Diagnosis Date  . Anxiety   . Arthritis    hands, knees  . Asthma    borderine  . Breast CA ([place]) 10/2012   Left Breast  . Complication of anesthesia    during colonoscopy fentanyl dropped blood pressure  . Dyspnea    exertion  . GERD (gastroesophageal reflux disease)   . Heart murmur   . High cholesterol   . IBS (irritable bowel syndrome)     Past Surgical History:  Procedure Laterality Date  . BREAST BIOPSY Left 10/2012   Stereo- Malignant  . BREAST BIOPSY Left    U/S Core- Benign  . BREAST EXCISIONAL BIOPSY Left   . BREAST EXCISIONAL BIOPSY Left   . BREAST LUMPECTOMY Left 11/2012  . CHOLECYSTECTOMY    . COLONOSCOPY    . DILATION AND CURETTAGE OF UTERUS  1967  . MASTECTOMY W/ SENTINEL NODE BIOPSY Left 08/18/2016  . MASTECTOMY W/ SENTINEL NODE BIOPSY Left 08/18/2016   Procedure: LEFT TOTAL MASTECTOMY WITH SENTINEL LYMPH NODE BIOPSY WITH BLUE DYE INJECTION;  Surgeon: Rolm Bookbinder, MD;  Location: [place];  Service: General;  Laterality: Left;  . SKIN CANCER EXCISION Left    eye brow    There were no vitals filed for this visit.      Subjective Assessment - 11/22/16 1108    Subjective I am tired. The exercises are going okay but I still have the swelling that comes under my arm.    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  Recurrence 6/18  with left mastectomy and sentinel node bioplsy on 08/18/2016. She says the doctor said her had to "scrape my ribs".  No chemo or radiation is needed at this time  IBS since 1980.   Patient Stated Goals Move arm normally  11/17/2016.  Pt states she can almost do this.     Currently in Pain? Yes   Pain Score 1    Pain Location Axilla   Pain Orientation Left   Pain Descriptors / Indicators Sore   Pain Type Acute pain                         [facility] Adult PT Treatment/Exercise - 11/22/16 0001      Lumbar Exercises: Aerobic   Stationary Bike 5 minutes   level 1     Shoulder Exercises: Stretch   Other Shoulder Stretches Strenth ABC stretches  verbal cues and addition modified downward down dog  Reviewed Warrior one yoga pose with 2 breaths on each side.  Instructed pt in entire Strength ABC packet today except: deadlifts, step ups, squats, and ab curls. Added pelvic tilts. Instructed pt in modified bird dog holding on to back  of chair with verbal and tactile cues for balance and to avoid hip tilt. Pt did rows and tricep extension with no weight.                         Long Term Clinic Goals - 11/17/16 1129      CC Long Term Goal  #1   Title Patient will verbalize understanding of lymphedema risk reduction practices.   Baseline 10/19/16- issued pt a handout about this, 11/17/2016 reviewed again, pt will consider coming to ABC class    Status Achieved     CC Long Term Goal  #2   Title Increase left shoulder active flexion to >/= 120 degrees to increased ease reaching and performing daily tasks.   Baseline 155 on 11/01/2016   Status Achieved     CC Long Term Goal  #3   Title Increase left shoulder active abduction to >/= 120 degrees to increased ease reaching and performing daily tasks.   Baseline 09/27/16- 114 degrees, 10/19/16- 127 degrees, 140 degrees  after PROM, 155 on 11/01/2016    Status Achieved     CC Long Term Goal  #4   Title Patient will report she has returned to >/= 50% of normal household chores including cooking and cleaning.   Baseline pt says she has only returned to 25% she doing some cooking and some laundry.  still not able to do cleaning, 10/19/16-40% .Acheived  pt is now getting down to clean shower.  Still has trouble lifting cases of water    Status Achieved     CC Long Term Goal  #5   Title Pt will be independent in a home exercise program    Time 8   Period Weeks   Status On-going            Plan - 11/22/16 1204    Clinical Impression Statement Instructed pt in Strength ABC program today. She required some verbal and tactile cues for modified bird dog in standing using chair for balance. Pt is continuing to progress towards goals in therapy. Educated pt to perform Strength ABC once a day and only do exercises instructed this session. Pt currently is not able to tolerate resistance with tricep extension and rows.    Rehab Potential Good   Clinical Impairments Affecting Rehab Potential delayed healing on chest wound    PT Frequency 2x / week   PT Duration 8 weeks   PT Treatment/Interventions ADLs/Self Care Home Management;Patient/family education;Manual techniques;Passive range of motion;Therapeutic activities;Taping;Neuromuscular re-education;Therapeutic exercise;Manual lymph drainage;Scar mobilization   PT Next Visit Plan continue with review/  instruction of Strength ABC program including warm up on the bike.  Plan for discharge by Sept 26    [place] ask about tightness since pt has been performing seated trunk rotation stretch, assess left anterior chest swelling and if it persists at MLD to POC next visit, Shoulder ROM; add lower trunk rotation   Consulted and Agree with Plan of Care Patient      Patient will benefit from skilled therapeutic intervention in order to improve the following  deficits and impairments:  Decreased skin integrity, Decreased scar mobility, Decreased knowledge of precautions, Pain, Impaired UE functional use, Increased fascial restricitons, Decreased strength, Decreased range of motion, Postural dysfunction  Visit Diagnosis: Stiffness of left shoulder, not elsewhere classified  Abnormal posture  Muscle weakness (generalized)     Problem List Patient Active Problem List   Diagnosis Date  Noted  . Breast neoplasm, Tis (DCIS), 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  . Asthma 01/26/2016  . PNA (pneumonia) 04/11/2014  . Hyperlipemia 04/11/2014  . 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    Allyson Sabal [facility] 11/22/2016, 12:07 PM  [place], [place], [address] Phone: [phone]   Fax:  [phone]  Name: Colleen Hancock MRN: [id number] Date of Birth: January 11, 1946  Manus Gunning, PT 11/22/16 12:07 PM

## 2016-11-24 ENCOUNTER — Ambulatory Visit: Payer: Medicare Other | Admitting: Physical Therapy

## 2016-11-24 DIAGNOSIS — M25612 Stiffness of left shoulder, not elsewhere classified: Secondary | ICD-10-CM | POA: Diagnosis not present

## 2016-11-24 DIAGNOSIS — Z483 Aftercare following surgery for neoplasm: Secondary | ICD-10-CM | POA: Diagnosis not present

## 2016-11-24 DIAGNOSIS — R293 Abnormal posture: Secondary | ICD-10-CM | POA: Diagnosis not present

## 2016-11-24 DIAGNOSIS — M6281 Muscle weakness (generalized): Secondary | ICD-10-CM

## 2016-11-24 NOTE — Therapy (Signed)
[place], [place], [address] Phone: [phone]   Fax:  [phone]  Physical Therapy Treatment  Patient Details  Name: Colleen Hancock MRN: [id number] Date of Birth: 31-May-1945 Referring Provider: Dr. Rolm Bookbinder  Encounter Date: 11/24/2016      PT End of Session - 11/24/16 1156    Visit Number 20   Number of Visits 24   Date for PT Re-Evaluation 11/30/17   PT Start Time 1100   PT Stop Time 1145   PT Time Calculation (min) 45 min   Activity Tolerance Patient tolerated treatment well   Behavior During Therapy [facility] for tasks assessed/performed      Past Medical History:  Diagnosis Date  . Anxiety   . Arthritis    hands, knees  . Asthma    borderine  . Breast CA ([place]) 10/2012   Left Breast  . Complication of anesthesia    during colonoscopy fentanyl dropped blood pressure  . Dyspnea    exertion  . GERD (gastroesophageal reflux disease)   . Heart murmur   . High cholesterol   . IBS (irritable bowel syndrome)     Past Surgical History:  Procedure Laterality Date  . BREAST BIOPSY Left 10/2012   Stereo- Malignant  . BREAST BIOPSY Left    U/S Core- Benign  . BREAST EXCISIONAL BIOPSY Left   . BREAST EXCISIONAL BIOPSY Left   . BREAST LUMPECTOMY Left 11/2012  . CHOLECYSTECTOMY    . COLONOSCOPY    . DILATION AND CURETTAGE OF UTERUS  1967  . MASTECTOMY W/ SENTINEL NODE BIOPSY Left 08/18/2016  . MASTECTOMY W/ SENTINEL NODE BIOPSY Left 08/18/2016   Procedure: LEFT TOTAL MASTECTOMY WITH SENTINEL LYMPH NODE BIOPSY WITH BLUE DYE INJECTION;  Surgeon: Rolm Bookbinder, MD;  Location: [place];  Service: General;  Laterality: Left;  . SKIN CANCER EXCISION Left    eye brow    There were no vitals filed for this visit.      Subjective Assessment - 11/24/16 1108    Subjective (P)  "I am just tired" Pt has had some difficulties with extended family.  Her husband is tired too. He is having  difficulty with delayed healing of foot  She is able to do the exercises but not all at one time. She feels that she knows how to do them.    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  Recurrence 6/18  with left mastectomy and sentinel node bioplsy on 08/18/2016. She says the doctor said her had to "scrape my ribs".  No chemo or radiation is needed at this time  IBS since 1980.   Patient Stated Goals (P)  Move arm normally  11/17/2016.  Pt states she can almost do this.     Currently in Pain? (P)  No/denies                         [facility] Adult PT Treatment/Exercise - 11/24/16 0001      Balance Poses: Yoga   Warrior I 2 reps  2 breaths on each side  needed verbal and tactile cues      Lumbar Exercises: Aerobic   Stationary Bike 5 minutes   level 1     Manual Therapy   Soft tissue mobilization with biotone, soft tissue work to lateral chest at border of scapula and into axilla at tender areas  Myofascial Release to left lateral chest at area of tightness in supine and sidelying at area around the incision.  It took a while for pt to get release and there was perceptible release of various speeds. Pt has tight tender area a lateral border of scapula                         Long Term Clinic Goals - 11/17/16 1129      CC Long Term Goal  #1   Title Patient will verbalize understanding of lymphedema risk reduction practices.   Baseline 10/19/16- issued pt a handout about this, 11/17/2016 reviewed again, pt will consider coming to ABC class    Status Achieved     CC Long Term Goal  #2   Title Increase left shoulder active flexion to >/= 120 degrees to increased ease reaching and performing daily tasks.   Baseline 155 on 11/01/2016   Status Achieved     CC Long Term Goal  #3   Title Increase left shoulder active abduction to >/= 120 degrees to increased ease reaching and performing daily tasks.    Baseline 09/27/16- 114 degrees, 10/19/16- 127 degrees, 140 degrees after PROM, 155 on 11/01/2016    Status Achieved     CC Long Term Goal  #4   Title Patient will report she has returned to >/= 50% of normal household chores including cooking and cleaning.   Baseline pt says she has only returned to 25% she doing some cooking and some laundry.  still not able to do cleaning, 10/19/16-40% .Acheived  pt is now getting down to clean shower.  Still has trouble lifting cases of water    Status Achieved     CC Long Term Goal  #5   Title Pt will be independent in a home exercise program    Time 8   Period Weeks   Status On-going            Plan - 12/06/2016 1156    Clinical Impression Statement Pt reports she is comfortable with doing Strenght ABC program so treatment today was focused on myofascial and soft tissue work to anterior and lateral chest. Pt reports she felt alot looser after treatment. Encouraged pt to continue with stretches and strengthening at home to promote better posture and more normal musculoskeletal function    Rehab Potential Good   PT Frequency 2x / week   PT Duration 8 weeks   PT Next Visit Plan coninue  warm up on the bike and Warrior I stretches and myofacial and soft tissue work to anterior and lateral chest Plan for discharge by Sept 26 Review Strength ABC as needed    Consulted and Agree with Plan of Care Patient      Patient will benefit from skilled therapeutic intervention in order to improve the following deficits and impairments:  Decreased skin integrity, Decreased scar mobility, Decreased knowledge of precautions, Pain, Impaired UE functional use, Increased fascial restricitons, Decreased strength, Decreased range of motion, Postural dysfunction  Visit Diagnosis: Stiffness of left shoulder, not elsewhere classified  Abnormal posture  Muscle weakness (generalized)  Aftercare following surgery for neoplasm       G-Codes - 12-06-2016 1201    Functional  Assessment Tool Used (Outpatient Only) clincial judgement    Functional Limitation Carrying, moving and handling objects   Carrying, Moving and Handling Objects Current Status ([id number]) At least 20 percent but less than 40 percent impaired, limited or  restricted   Carrying, Moving and Handling Objects Goal Status [phone]) At least 1 percent but less than 20 percent impaired, limited or restricted      Problem List Patient Active Problem List   Diagnosis Date Noted  . Breast neoplasm, Tis (DCIS), 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  . Asthma 01/26/2016  . PNA (pneumonia) 04/11/2014  . Hyperlipemia 04/11/2014  . 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   Donato Heinz. Owens Shark PT  [name] 11/24/2016, 12:02 PM  [place], [place], [address] Phone: [phone]   Fax:  [phone]  Name: Colleen Hancock MRN: [id number] Date of Birth: 09-Jan-1946

## 2016-11-29 ENCOUNTER — Ambulatory Visit: Payer: Medicare Other | Admitting: Physical Therapy

## 2016-11-29 DIAGNOSIS — R293 Abnormal posture: Secondary | ICD-10-CM | POA: Diagnosis not present

## 2016-11-29 DIAGNOSIS — M25612 Stiffness of left shoulder, not elsewhere classified: Secondary | ICD-10-CM

## 2016-11-29 DIAGNOSIS — Z483 Aftercare following surgery for neoplasm: Secondary | ICD-10-CM | POA: Diagnosis not present

## 2016-11-29 DIAGNOSIS — M6281 Muscle weakness (generalized): Secondary | ICD-10-CM | POA: Diagnosis not present

## 2016-11-29 NOTE — Therapy (Signed)
Colleen H[place], [place], [address] Phone: [phone]   Fax:  [phone]  Physical Therapy Treatment  Patient Details  Name: Colleen Hancock MRN: [id number] Date of Birth: 1945-06-28 Referring Provider: Dr. Rolm Hancock  Encounter Date: 11/29/2016      PT End of Session - 11/29/16 1425    Visit Number 21   Number of Visits 24   Date for PT Re-Evaluation 11/30/17   PT Start Time 0601  pt did not need 45 min   PT Stop Time 1420   PT Time Calculation (min) 32 min   Behavior During Therapy [facility] for tasks assessed/performed      Past Medical History:  Diagnosis Date  . Anxiety   . Arthritis    hands, knees  . Asthma    borderine  . Breast CA ([place]) 10/2012   Left Breast  . Complication of anesthesia    during colonoscopy fentanyl dropped blood pressure  . Dyspnea    exertion  . GERD (gastroesophageal reflux disease)   . Heart murmur   . High cholesterol   . IBS (irritable bowel syndrome)     Past Surgical History:  Procedure Laterality Date  . BREAST BIOPSY Left 10/2012   Stereo- Malignant  . BREAST BIOPSY Left    U/S Core- Benign  . BREAST EXCISIONAL BIOPSY Left   . BREAST EXCISIONAL BIOPSY Left   . BREAST LUMPECTOMY Left 11/2012  . CHOLECYSTECTOMY    . COLONOSCOPY    . DILATION AND CURETTAGE OF UTERUS  1967  . MASTECTOMY W/ SENTINEL NODE BIOPSY Left 08/18/2016  . MASTECTOMY W/ SENTINEL NODE BIOPSY Left 08/18/2016   Procedure: LEFT TOTAL MASTECTOMY WITH SENTINEL LYMPH NODE BIOPSY WITH BLUE DYE INJECTION;  Surgeon: Colleen Bookbinder, MD;  Location: [place];  Service: General;  Laterality: Left;  . SKIN CANCER EXCISION Left    eye brow    There were no vitals filed for this visit.      Subjective Assessment - 11/29/16 1413    Subjective pt has a new onset of pustule at lateral incision line that just   'popped up'  it is sore to the touch.  She has increased swelling above the  incision line   Pertinent History Left lumpectomy with SNLB for DCIS 8/14 with 36 radiation treatments at that time.  Recurrence 6/18  with left mastectomy and sentinel node bioplsy on 08/18/2016. She says the doctor said her had to "scrape my ribs".  No chemo or radiation is needed at this time  IBS since 1980.   Patient Stated Goals Move arm normally  11/17/2016.  Pt states she can almost do this.     Currently in Pain? Yes   Pain Score 3    Pain Location Chest   Pain Orientation Left;Lateral   Pain Descriptors / Indicators Sore   Pain Type Acute pain   Pain Onset In the past 7 days                         [facility] Adult PT Treatment/Exercise - 11/29/16 0001      Self-Care   Self-Care Other Self-Care Comments   Other Self-Care Comments  contacted Dr. Cristal Hancock office who made an appointment for pt to come in and see the PA  Colleen Hancock Clinic Goals - 11/29/16 1429      CC Long Term Goal  #1   Title Patient will verbalize understanding of lymphedema risk reduction practices.   Status Achieved     CC Long Term Goal  #2   Title Increase left shoulder active flexion to >/= 120 degrees to increased ease reaching and performing daily tasks.   Status Achieved     CC Long Term Goal  #3   Title Increase left shoulder active abduction to >/= 120 degrees to increased ease reaching and performing daily tasks.   Status Achieved     CC Long Term Goal  #4   Title Patient will report she has returned to >/= 50% of normal household chores including cooking and cleaning.   Status Achieved     CC Long Term Goal  #5   Title Pt will be independent in a home exercise program    Status Achieved     CC Long Term Goal  #6   Title Pt will be independent in self manual lymph drainage to left chest    Time 4   Period Weeks   Status New     Additional Goals   Additional Goals Yes            Plan - 11/29/16 1425    Clinical  Impression Statement Pt has new onset of pustule that is worrisome for infection.  She alos has swelling above the incision, but cannot do MLD if she has a infection Will suspend treatment until infection is cleared. Pt made an appointment to return in 2 weeks.    PT Next Visit Plan Once infection is clear, reassess and teach self manual lymph drainage as pt feels she needs a review. send renewal if pt needs continued treatments    Consulted and Agree with Plan of Care Patient      Patient will benefit from skilled therapeutic intervention in order to improve the following deficits and impairments:  Decreased skin integrity, Decreased scar mobility, Decreased knowledge of precautions, Pain, Impaired UE functional use, Increased fascial restricitons, Decreased strength, Decreased range of motion, Postural dysfunction  Visit Diagnosis: Stiffness of left shoulder, not elsewhere classified  Abnormal posture  Muscle weakness (generalized)  Aftercare following surgery for neoplasm     Problem List Patient Active Problem List   Diagnosis Date Noted  . Breast neoplasm, Tis (DCIS), 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  . Asthma 01/26/2016  . PNA (pneumonia) 04/11/2014  . Hyperlipemia 04/11/2014  . 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   Colleen Hancock PT  Colleen Hancock 11/29/2016, 2:31 PM  [place], [place], [address] Phone: [phone]   Fax:  [phone]  Name: Colleen Hancock MRN: [id number] Date of Birth: [birth year]

## 2016-12-01 ENCOUNTER — Encounter: Payer: Medicare Other | Admitting: Physical Therapy

## 2016-12-06 ENCOUNTER — Ambulatory Visit: Payer: Medicare Other | Admitting: Physical Therapy

## 2016-12-08 ENCOUNTER — Encounter: Payer: Medicare Other | Admitting: Physical Therapy

## 2016-12-13 ENCOUNTER — Encounter: Payer: Medicare Other | Admitting: Physical Therapy

## 2016-12-15 ENCOUNTER — Ambulatory Visit: Payer: Medicare Other | Attending: General Surgery | Admitting: Physical Therapy

## 2016-12-15 DIAGNOSIS — M6281 Muscle weakness (generalized): Secondary | ICD-10-CM | POA: Diagnosis not present

## 2016-12-15 DIAGNOSIS — G8929 Other chronic pain: Secondary | ICD-10-CM | POA: Diagnosis not present

## 2016-12-15 DIAGNOSIS — M25512 Pain in left shoulder: Secondary | ICD-10-CM | POA: Diagnosis not present

## 2016-12-15 DIAGNOSIS — M25612 Stiffness of left shoulder, not elsewhere classified: Secondary | ICD-10-CM | POA: Diagnosis not present

## 2016-12-15 DIAGNOSIS — Z483 Aftercare following surgery for neoplasm: Secondary | ICD-10-CM | POA: Insufficient documentation

## 2016-12-15 DIAGNOSIS — R293 Abnormal posture: Secondary | ICD-10-CM | POA: Diagnosis not present

## 2016-12-15 NOTE — Therapy (Signed)
[place], [place], [address] Phone: [phone]   Fax:  [phone]  Physical Therapy Treatment  Patient Details  Name: Colleen Hancock MRN: [id number] Date of Birth: 1945/06/07 Referring Provider: Dr. Donne Hazel   Encounter Date: 12/15/2016      PT End of Session - 12/15/16 1114    Visit Number 22   Number of Visits 32   Date for PT Re-Evaluation 01/15/18   PT Start Time 1110   PT Stop Time 1150   PT Time Calculation (min) 40 min   Activity Tolerance Patient tolerated treatment well   Behavior During Therapy [facility] for tasks assessed/performed      Past Medical History:  Diagnosis Date  . Anxiety   . Arthritis    hands, knees  . Asthma    borderine  . Breast CA ([place]) 10/2012   Left Breast  . Complication of anesthesia    during colonoscopy fentanyl dropped blood pressure  . Dyspnea    exertion  . GERD (gastroesophageal reflux disease)   . Heart murmur   . High cholesterol   . IBS (irritable bowel syndrome)     Past Surgical History:  Procedure Laterality Date  . BREAST BIOPSY Left 10/2012   Stereo- Malignant  . BREAST BIOPSY Left    U/S Core- Benign  . BREAST EXCISIONAL BIOPSY Left   . BREAST EXCISIONAL BIOPSY Left   . BREAST LUMPECTOMY Left 11/2012  . CHOLECYSTECTOMY    . COLONOSCOPY    . DILATION AND CURETTAGE OF UTERUS  1967  . MASTECTOMY W/ SENTINEL NODE BIOPSY Left 08/18/2016  . MASTECTOMY W/ SENTINEL NODE BIOPSY Left 08/18/2016   Procedure: LEFT TOTAL MASTECTOMY WITH SENTINEL LYMPH NODE BIOPSY WITH BLUE DYE INJECTION;  Surgeon: Rolm Bookbinder, MD;  Location: [facility];  Service: General;  Laterality: Left;  . SKIN CANCER EXCISION Left    eye brow    There were no vitals filed for this visit.      Subjective Assessment - 12/15/16 1119    Subjective Pt went the surgeon and he lanced the pustule.  It is all healed up now. and the doctor said she was ok to return to PT.  she is having discomfort in her axilla "It feels like a band in there"  She also continues with swelling above her incision.   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  Recurrence 6/18  with left mastectomy and sentinel node bioplsy on 08/18/2016. She says the doctor said her had to "scrape my ribs".  No chemo or radiation is needed at this time  IBS since 1980.   Patient Stated Goals Move arm normally  11/17/2016.  Pt states she can almost do this.              [facility] PT Assessment - 12/15/16 0001      Assessment   Medical Diagnosis s/p left mastectomy with SNLB   Referring Provider Dr. Donne Hazel    Onset Date/Surgical Date 08/18/16     Prior Function   Level of Independence Independent     Observation/Other Assessments   Observations fullness observable above incision on left chest    Skin Integrity wound is healed with no open areas.      Palpation   Palpation comment very tender trigger points at left interscapular and posterior axillay area  [facility] Adult PT Treatment/Exercise - 12/15/16 0001      Self-Care   Self-Care Other Self-Care Comments   Other Self-Care Comments  gave pt information about [employer] as her silicone breast prosthesis is very heavy and may be contributing to her shoulder pain      Shoulder Exercises: Supine   Other Supine Exercises dowel rod flexion and abduction x 10 reps    Other Supine Exercises 5 small circles with hand pointed to ceiling.  1/4 circle stop, full circle slow      Shoulder Exercises: Sidelying   ABduction AROM;Left;10 reps   Other Sidelying Exercises 5 small circles with hand pointing to ceiling      Manual Therapy   Soft tissue mobilization with biofreeze diluted 75% with biotone to posterior scapular area and posterior and distal axilla at tight ropy areas.  muscles appeared softer after treatment                          Long Term Clinic Goals - 12/15/16 1203      CC Long Term Goal  #1   Title Patient will verbalize understanding of lymphedema risk reduction practices.   Status Achieved     CC Long Term Goal  #2   Title Increase left shoulder active flexion to >/= 120 degrees to increased ease reaching and performing daily tasks.   Status Achieved     CC Long Term Goal  #3   Title Increase left shoulder active abduction to >/= 120 degrees to increased ease reaching and performing daily tasks.   Status Achieved     CC Long Term Goal  #4   Title Patient will report she has returned to >/= 50% of normal household chores including cooking and cleaning.   Status Achieved     CC Long Term Goal  #5   Title Pt will be independent in a home exercise program    Status Achieved     CC Long Term Goal  #6   Title Pt will be independent in self manual lymph drainage to left chest    Time 4   Period Weeks   Status New     CC Long Term Goal  #7   Title Pt will report pain an discomfort in left chest is decreased by 50%   Time 4   Period Weeks   Status New     Additional Goals   Additional Goals Yes            Plan - 12/15/16 1158    Clinical Impression Statement Wound is healed with no open areas.  Pt c/o tightness in left back and axilla that was improved with soft tissue work and biofreeze today.  She has agreed to continue exercises at home, so will focus on soft tissue work and modify/upgrad exercise program as needed with goal to decrease pain and tightness  will review self manual lymph draiange techniques for left chest    Clinical Impairments Affecting Rehab Potential delayed healing on chest wound    PT Next Visit Plan reassess and teach self manual lymph drainage as pt feels she needs a review. Soft tissue work to left back and axilla with biofreeze if beneficial    Consulted and Agree with Plan of Care Patient      Patient will benefit from skilled  therapeutic intervention in order to improve the following deficits and impairments:     Visit Diagnosis: Abnormal  posture - Plan: PT plan of care cert/re-cert  Aftercare following surgery for neoplasm - Plan: PT plan of care cert/re-cert  Muscle weakness (generalized) - Plan: PT plan of care cert/re-cert  Chronic pain in left shoulder - Plan: PT plan of care cert/re-cert     Problem List Patient Active Problem List   Diagnosis Date Noted  . Breast neoplasm, Tis (DCIS), 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  . Asthma 01/26/2016  . PNA (pneumonia) 04/11/2014  . Hyperlipemia 04/11/2014  . 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   Donato Heinz. Owens Shark PT  [name] 12/15/2016, 12:09 PM  [place], [place], [address] Phone: [phone]   Fax:  [phone]  Name: Colleen Hancock MRN: [id number] Date of Birth: Mar 07, 1946

## 2016-12-17 DIAGNOSIS — Z23 Encounter for immunization: Secondary | ICD-10-CM | POA: Diagnosis not present

## 2016-12-20 ENCOUNTER — Encounter: Payer: Medicare Other | Admitting: Physical Therapy

## 2016-12-22 ENCOUNTER — Ambulatory Visit: Payer: Medicare Other

## 2016-12-22 DIAGNOSIS — M25512 Pain in left shoulder: Secondary | ICD-10-CM | POA: Diagnosis not present

## 2016-12-22 DIAGNOSIS — G8929 Other chronic pain: Secondary | ICD-10-CM

## 2016-12-22 DIAGNOSIS — Z483 Aftercare following surgery for neoplasm: Secondary | ICD-10-CM

## 2016-12-22 DIAGNOSIS — M6281 Muscle weakness (generalized): Secondary | ICD-10-CM | POA: Diagnosis not present

## 2016-12-22 DIAGNOSIS — R293 Abnormal posture: Secondary | ICD-10-CM | POA: Diagnosis not present

## 2016-12-22 DIAGNOSIS — M25612 Stiffness of left shoulder, not elsewhere classified: Secondary | ICD-10-CM

## 2016-12-22 NOTE — Patient Instructions (Signed)

## 2016-12-22 NOTE — Therapy (Signed)
[place], [place], [address] Phone: [phone]   Fax:  [phone]  Physical Therapy Treatment  Patient Details  Name: Colleen Hancock MRN: [id number] Date of Birth: 06-28-1945 Referring Provider: Dr. Donne Hazel   Encounter Date: 12/22/2016      PT End of Session - 12/22/16 1200    Visit Number 66  KX   Number of Visits 32   Date for PT Re-Evaluation 01/15/18   PT Start Time 1106   PT Stop Time 1153   PT Time Calculation (min) 47 min   Activity Tolerance Patient tolerated treatment well   Behavior During Therapy [facility] for tasks assessed/performed      Past Medical History:  Diagnosis Date  . Anxiety   . Arthritis    hands, knees  . Asthma    borderine  . Breast CA ([place]) 10/2012   Left Breast  . Complication of anesthesia    during colonoscopy fentanyl dropped blood pressure  . Dyspnea    exertion  . GERD (gastroesophageal reflux disease)   . Heart murmur   . High cholesterol   . IBS (irritable bowel syndrome)     Past Surgical History:  Procedure Laterality Date  . BREAST BIOPSY Left 10/2012   Stereo- Malignant  . BREAST BIOPSY Left    U/S Core- Benign  . BREAST EXCISIONAL BIOPSY Left   . BREAST EXCISIONAL BIOPSY Left   . BREAST LUMPECTOMY Left 11/2012  . CHOLECYSTECTOMY    . COLONOSCOPY    . DILATION AND CURETTAGE OF UTERUS  1967  . MASTECTOMY W/ SENTINEL NODE BIOPSY Left 08/18/2016  . MASTECTOMY W/ SENTINEL NODE BIOPSY Left 08/18/2016   Procedure: LEFT TOTAL MASTECTOMY WITH SENTINEL LYMPH NODE BIOPSY WITH BLUE DYE INJECTION;  Surgeon: Rolm Bookbinder, MD;  Location: [facility];  Service: General;  Laterality: Left;  . SKIN CANCER EXCISION Left    eye brow    There were no vitals filed for this visit.      Subjective Assessment - 12/22/16 1108    Subjective Haven't gotten to do too many exercises this week due to taking care of my husband. My Lt axilla is feeling same as  last week. The relief I get after therapy helps for a few days but then it tightens down again. The place on my ribs doesn't hurt today, but is sore.    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  Recurrence 6/18  with left mastectomy and sentinel node bioplsy on 08/18/2016. She says the doctor said her had to "scrape my ribs".  No chemo or radiation is needed at this time  IBS since 1980.   Patient Stated Goals Move arm normally  11/17/2016.  Pt states she can almost do this.     Currently in Pain? No/denies                         [facility] Adult PT Treatment/Exercise - 12/22/16 1112      Manual Therapy   Myofascial Release In Supine to left lateral chest but focusing inferior to incision and with gentle Lt UE pulling throughout being careful of recently healed blister area   Manual Lymphatic Drainage (MLD) In supine reviewing with pt throughout: Short neck, 5 diaphragmatic breaths, right axillary nodes, anterior interaxillary anastamosis, left inguinal nodes and establishment of axillo inguinal anastomosis, moved fluid superior to mastectomy scar towards interaxillary pathway  and fluid inferior towards axillo inguinal pathway   Passive ROM in supine to left shoulder into abduction and flexion for UE pulling during myofascial release                PT Education - 12/22/16 1159    Education provided Yes   Education Details Self manual lymph drainage; also showed pt how she can perform myofascial release inferior to incision in doorway while stretching UE into flexion and abduction to increase myofascial pull   Person(s) Educated Patient   Methods Explanation;Demonstration;Handout   Comprehension Verbalized understanding;Returned demonstration;Need further instruction                [facility] Goals - 12/15/16 1203      CC Long Term Goal  #1   Title Patient will verbalize understanding of lymphedema  risk reduction practices.   Status Achieved     CC Long Term Goal  #2   Title Increase left shoulder active flexion to >/= 120 degrees to increased ease reaching and performing daily tasks.   Status Achieved     CC Long Term Goal  #3   Title Increase left shoulder active abduction to >/= 120 degrees to increased ease reaching and performing daily tasks.   Status Achieved     CC Long Term Goal  #4   Title Patient will report she has returned to >/= 50% of normal household chores including cooking and cleaning.   Status Achieved     CC Long Term Goal  #5   Title Pt will be independent in a home exercise program    Status Achieved     CC Long Term Goal  #6   Title Pt will be independent in self manual lymph drainage to left chest    Time 4   Period Weeks   Status New     CC Long Term Goal  #7   Title Pt will report pain an discomfort in left chest is decreased by 50%   Time 4   Period Weeks   Status New     Additional Goals   Additional Goals Yes            Plan - 12/22/16 1201    Clinical Impression Statement Pt continued with no open areas this week so at wound area so focused on gentle myofascial release and stretching inferior to incision and instructing her how to do same at home in doorway. Also instructed/reviewed with pt self manual lymph drainage and issued handout for this. She hasn't done much of her exercises since last visit as she reports she has been running around more with her husband still recovering from surgery and had a "rough week".    Rehab Potential Good   Clinical Impairments Affecting Rehab Potential delayed healing on chest wound    PT Frequency 2x / week   PT Duration 8 weeks   PT Treatment/Interventions ADLs/Self Care Home Management;Patient/family education;Manual techniques;Passive range of motion;Therapeutic activities;Taping;Neuromuscular re-education;Therapeutic exercise;Manual lymph drainage;Scar mobilization   PT Next Visit Plan reassess  and review prn self manual lymph drainage. Soft tissue work to left back and axilla with biofreeze if beneficial and assess/cont myofascial release inferior to incision   Consulted and Agree with Plan of Care Patient      Patient will benefit from skilled therapeutic intervention in order to improve the following deficits and impairments:  Decreased skin integrity, Decreased scar mobility, Decreased knowledge of precautions, Pain, Impaired UE functional use, Increased  fascial restricitons, Decreased strength, Decreased range of motion, Postural dysfunction  Visit Diagnosis: Aftercare following surgery for neoplasm  Abnormal posture  Chronic pain in left shoulder  Stiffness of left shoulder, not elsewhere classified     Problem List Patient Active Problem List   Diagnosis Date Noted  . Breast neoplasm, Tis (DCIS), 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  . Asthma 01/26/2016  . PNA (pneumonia) 04/11/2014  . Hyperlipemia 04/11/2014  . 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    Otelia Limes, PTA 12/22/2016, 12:06 PM  [place], [place], [address] Phone: [phone]   Fax:  [phone]  Name: Colleen Hancock MRN: [id number] Date of Birth: 03-Oct-1945

## 2016-12-28 ENCOUNTER — Ambulatory Visit: Payer: Medicare Other

## 2016-12-28 DIAGNOSIS — R293 Abnormal posture: Secondary | ICD-10-CM

## 2016-12-28 DIAGNOSIS — Z483 Aftercare following surgery for neoplasm: Secondary | ICD-10-CM | POA: Diagnosis not present

## 2016-12-28 DIAGNOSIS — M25612 Stiffness of left shoulder, not elsewhere classified: Secondary | ICD-10-CM | POA: Diagnosis not present

## 2016-12-28 DIAGNOSIS — M6281 Muscle weakness (generalized): Secondary | ICD-10-CM | POA: Diagnosis not present

## 2016-12-28 DIAGNOSIS — G8929 Other chronic pain: Secondary | ICD-10-CM

## 2016-12-28 DIAGNOSIS — M25512 Pain in left shoulder: Secondary | ICD-10-CM

## 2016-12-28 NOTE — Therapy (Addendum)
[place], [place], [address] Phone: [phone]   Fax:  [phone]  Physical Therapy Treatment  Patient Details  Name: Colleen Hancock MRN: [id number] Date of Birth: 07/25/1945 Referring Provider: Dr. Donne Hazel   Encounter Date: 12/28/2016      PT End of Session - 12/28/16 1201    Visit Number [place]   Number of Visits 32   Date for PT Re-Evaluation 01/15/18   PT Start Time 1112   PT Stop Time 1157   PT Time Calculation (min) 45 min   Activity Tolerance Patient tolerated treatment well   Behavior During Therapy [facility] for tasks assessed/performed      Past Medical History:  Diagnosis Date  . Anxiety   . Arthritis    hands, knees  . Asthma    borderine  . Breast CA ([place]) 10/2012   Left Breast  . Complication of anesthesia    during colonoscopy fentanyl dropped blood pressure  . Dyspnea    exertion  . GERD (gastroesophageal reflux disease)   . Heart murmur   . High cholesterol   . IBS (irritable bowel syndrome)     Past Surgical History:  Procedure Laterality Date  . BREAST BIOPSY Left 10/2012   Stereo- Malignant  . BREAST BIOPSY Left    U/S Core- Benign  . BREAST EXCISIONAL BIOPSY Left   . BREAST EXCISIONAL BIOPSY Left   . BREAST LUMPECTOMY Left 11/2012  . CHOLECYSTECTOMY    . COLONOSCOPY    . DILATION AND CURETTAGE OF UTERUS  1967  . MASTECTOMY W/ SENTINEL NODE BIOPSY Left 08/18/2016  . MASTECTOMY W/ SENTINEL NODE BIOPSY Left 08/18/2016   Procedure: LEFT TOTAL MASTECTOMY WITH SENTINEL LYMPH NODE BIOPSY WITH BLUE DYE INJECTION;  Surgeon: Rolm Bookbinder, MD;  Location: [facility];  Service: General;  Laterality: Left;  . SKIN CANCER EXCISION Left    eye brow    There were no vitals filed for this visit.      Subjective Assessment - 12/28/16 1119    Subjective My Lt axilla is staying pretty sore but other than that I am feeling okay. All my energy is just focused on my  husband right now.    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  Recurrence 6/18  with left mastectomy and sentinel node bioplsy on 08/18/2016. She says the doctor said her had to "scrape my ribs".  No chemo or radiation is needed at this time  IBS since 1980.   Patient Stated Goals Move arm normally  11/17/2016.  Pt states she can almost do this.     Currently in Pain? No/denies                         [facility] Adult PT Treatment/Exercise - 12/28/16 0001      Shoulder Exercises: Standing   Flexion AAROM;Left   Flexion Limitations Had pt perform this in doorway to instruct in myofascial release in this position to Lt axilla/flank     Manual Therapy   Myofascial Release In Supine to left lateral chest but focusing inferior to incision and with gentle Lt UE pulling throughout being careful of recently healed blister area, then instructing pt in this and having her perform as well.    Manual Lymphatic Drainage (MLD) In supine reviewing with pt throughout having pt return demonstration as well: Short neck, 5 diaphragmatic breaths, right  axillary nodes, anterior interaxillary anastamosis, left inguinal nodes and establishment of axillo inguinal anastomosis, moved fluid superior to mastectomy scar towards interaxillary pathway and fluid inferior towards axillo inguinal pathway   Passive ROM in supine to left shoulder into abduction and flexion for UE pulling during myofascial release                PT Education - 12/28/16 1210    Education provided Yes   Education Details Reviewed self manual lymph drainage, having pt return demonstration which she did well. Also instructed her in self myofascial release and brief body mechanics with lifting cases of water and when leaning over to wash her husbands feet when in the shower to try to avoid Lt axilla/flank tightening up each time.    Person(s) Educated Patient   Methods  Explanation   Comprehension Verbalized understanding                [facility] Goals - 12/28/16 1203      CC Long Term Goal  #1   Title Patient will verbalize understanding of lymphedema risk reduction practices.   Baseline 10/19/16- issued pt a handout about this, 11/17/2016 reviewed again, pt will consider coming to ABC class    Status Achieved     CC Long Term Goal  #2   Title Increase left shoulder active flexion to >/= 120 degrees to increased ease reaching and performing daily tasks.   Baseline 155 on 11/01/2016   Status Achieved     CC Long Term Goal  #3   Title Increase left shoulder active abduction to >/= 120 degrees to increased ease reaching and performing daily tasks.   Baseline 09/27/16- 114 degrees, 10/19/16- 127 degrees, 140 degrees after PROM, 155 on 11/01/2016    Status Achieved     CC Long Term Goal  #4   Title Patient will report she has returned to >/= 50% of normal household chores including cooking and cleaning.   Baseline pt says she has only returned to 25% she doing some cooking and some laundry.  still not able to do cleaning, 10/19/16-40% .Acheived  pt is now getting down to clean shower.  Still has trouble lifting cases of water; Pt reports no limitations with housework except lfiting cases of water increase Lt axill/flank tightness-12/28/16   Status Achieved     CC Long Term Goal  #5   Title Pt will be independent in a home exercise program    Status Achieved     CC Long Term Goal  #6   Title Pt will be independent in self manual lymph drainage to left chest    Baseline Pt returned demonstration of this with good technique-12/28/16   Status Achieved     CC Long Term Goal  #7   Title Pt will report pain an discomfort in left chest is decreased by 50%   Baseline 70% improvement reported-12/28/16   Status Achieved            Plan - 12/28/16 1202    Clinical Impression Statement Pt has done very well with therapy and the sore has healed  well. Pt has met all goals and is ready for D/C at this time.    Rehab Potential Good   Clinical Impairments Affecting Rehab Potential delayed healing on chest wound    PT Frequency 2x / week   PT Duration 8 weeks   PT Treatment/Interventions ADLs/Self Care Home Management;Patient/family education;Manual techniques;Passive range of motion;Therapeutic activities;Taping;Neuromuscular re-education;Therapeutic  exercise;Manual lymph drainage;Scar mobilization   PT Next Visit Plan D/C this visit.   Consulted and Agree with Plan of Care Patient      Patient will benefit from skilled therapeutic intervention in order to improve the following deficits and impairments:  Decreased skin integrity, Decreased scar mobility, Decreased knowledge of precautions, Pain, Impaired UE functional use, Increased fascial restricitons, Decreased strength, Decreased range of motion, Postural dysfunction  Visit Diagnosis: Aftercare following surgery for neoplasm  Abnormal posture  Chronic pain in left shoulder  Stiffness of left shoulder, not elsewhere classified     Problem List Patient Active Problem List   Diagnosis Date Noted  . Breast neoplasm, Tis (DCIS), 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  . Asthma 01/26/2016  . PNA (pneumonia) 04/11/2014  . Hyperlipemia 04/11/2014  . 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   .PHYSICAL THERAPY DISCHARGE SUMMARY  Visits from Start of Care: 24  Current functional level related to goals / functional outcomes: As above   Remaining deficits: As above    Education / Equipment: Home exercise, self manual lymph drainage  Plan: Patient agrees to discharge.  Patient goals were met. Patient is being discharged due to meeting the stated rehab goals.  ?????     Donato Heinz. Owens Shark PT  Otelia Limes, PTA 12/28/2016, 12:12 PM  [place], [place], [address] Phone: [phone]    Fax:  [phone]  Name: Colleen Hancock MRN: [id number] Date of Birth: Jul 06, 1945

## 2016-12-30 ENCOUNTER — Ambulatory Visit: Payer: Medicare Other | Admitting: Physical Therapy

## 2017-01-03 ENCOUNTER — Encounter: Payer: Medicare Other | Admitting: Physical Therapy

## 2017-01-05 ENCOUNTER — Encounter: Payer: Medicare Other | Admitting: Physical Therapy

## 2017-01-23 ENCOUNTER — Ambulatory Visit: Payer: Medicare Other | Admitting: Hematology and Oncology

## 2017-01-30 ENCOUNTER — Telehealth: Payer: Self-pay | Admitting: Hematology and Oncology

## 2017-01-30 NOTE — Telephone Encounter (Signed)
Spoke with patient regarding appts added per 11/19 sch msg. Scheduled on the next date they were available.

## 2017-02-06 ENCOUNTER — Telehealth: Payer: Self-pay

## 2017-02-06 NOTE — Telephone Encounter (Signed)
Returned pt call to reschedule appt due to conflict with an appt for her husband. Rescheduled appt. No further questions at this time.  Cyndia Bent RN

## 2017-02-09 ENCOUNTER — Ambulatory Visit: Payer: Medicare Other | Admitting: Hematology and Oncology

## 2017-02-12 ENCOUNTER — Telehealth: Payer: Self-pay | Admitting: *Deleted

## 2017-02-12 NOTE — Telephone Encounter (Signed)
Called patient to let her know that [facility] is closed on 02/13/2017. Left message with daughter and she stated she will let her mother know.

## 2017-02-13 ENCOUNTER — Ambulatory Visit: Payer: Medicare Other | Admitting: Hematology and Oncology

## 2017-02-16 DIAGNOSIS — K580 Irritable bowel syndrome with diarrhea: Secondary | ICD-10-CM | POA: Diagnosis present

## 2017-02-16 DIAGNOSIS — K589 Irritable bowel syndrome without diarrhea: Secondary | ICD-10-CM | POA: Diagnosis present

## 2017-02-16 DIAGNOSIS — J302 Other seasonal allergic rhinitis: Secondary | ICD-10-CM | POA: Diagnosis present

## 2017-02-20 DIAGNOSIS — Z01419 Encounter for gynecological examination (general) (routine) without abnormal findings: Secondary | ICD-10-CM | POA: Diagnosis not present

## 2017-02-20 DIAGNOSIS — Z124 Encounter for screening for malignant neoplasm of cervix: Secondary | ICD-10-CM | POA: Diagnosis not present

## 2017-03-13 NOTE — Assessment & Plan Note (Signed)
Left breast DCIS ER/PR negative high-grade status post lumpectomy July 2014 currently on adjuvant tamoxifen since September 2014 discontinued May 2018.  Recurrence: 07/25/2016: Suspicious 0.9 cm group of calcifications lower inner quadrant left breast biopsy-proven to be ER/PR negative high-grade DCIS  08/22/2016: Left simple mastectomy: DCIS with necrosis and calcifications, grade 3, margins negative, 0/1 lymph node negative, ER 0%, PR 0% stage 0 ----------------------------------------------------------- Recommendation: No role of antiestrogen therapy Surveillance: 1. Breast Exam: 03/14/17: Benign 2. Mammogram Rt Breast to be done May 2019 Return to clinic annually for breast exams and follow-up

## 2017-03-14 ENCOUNTER — Telehealth: Payer: Self-pay | Admitting: Hematology and Oncology

## 2017-03-14 ENCOUNTER — Inpatient Hospital Stay: Payer: Medicare Other | Attending: Hematology and Oncology | Admitting: Hematology and Oncology

## 2017-03-14 DIAGNOSIS — D0512 Intraductal carcinoma in situ of left breast: Secondary | ICD-10-CM | POA: Diagnosis not present

## 2017-03-14 DIAGNOSIS — R079 Chest pain, unspecified: Secondary | ICD-10-CM | POA: Insufficient documentation

## 2017-03-14 DIAGNOSIS — R918 Other nonspecific abnormal finding of lung field: Secondary | ICD-10-CM | POA: Insufficient documentation

## 2017-03-14 DIAGNOSIS — Z171 Estrogen receptor negative status [ER-]: Secondary | ICD-10-CM | POA: Diagnosis not present

## 2017-03-14 DIAGNOSIS — Z9012 Acquired absence of left breast and nipple: Secondary | ICD-10-CM | POA: Insufficient documentation

## 2017-03-14 DIAGNOSIS — Z79899 Other long term (current) drug therapy: Secondary | ICD-10-CM | POA: Diagnosis not present

## 2017-03-14 NOTE — Progress Notes (Signed)
Patient Care Team: Velna Hatchet, MD as PCP - General (Internal Medicine)  DIAGNOSIS:  Encounter Diagnosis  Name Primary?  . Ductal carcinoma in situ (DCIS) of left breast     SUMMARY OF ONCOLOGIC HISTORY:   Ductal carcinoma in situ (DCIS) of left breast   09/2012 Initial Diagnosis    Mammographically detected left breast abnormality biopsy-proven high-grade  DCIS ER/PR/ HER-2 negative status post lumpectomy (SLN neg) followed by radiation (in [place])      10/2012 -  Anti-estrogen oral therapy    Tamoxifen 20 mg daily started in [place] (patient wishes to take it in spite of her DCIS being ER/PR negative)      07/26/2016 Relapse/Recurrence    Left breast suspicious 0.9 cm group of linear branching calcifications, biopsy revealed DCIS with calcifications ER 0%, PR 0%      08/22/2016 Surgery    Left simple mastectomy: DCIS with necrosis and calcifications, grade 3, margins negative, 0/1 lymph node negative, ER 0%, PR 0% stage 0       CHIEF COMPLIANT: Follow-up of DCIS, complaining of left chest wall nodule  INTERVAL HISTORY: Colleen Hancock is a 72 year old with above-mentioned history of left breast DCIS who underwent a mastectomy and is here for annual follow-up.  She complains of a nodule on the left chest wall.  She was massaging the chest wall and she felt a lump.  She is also having pain and discomfort in the chest wall and axilla.  REVIEW OF SYSTEMS:   Constitutional: Denies fevers, chills or abnormal weight loss Eyes: Denies blurriness of vision Ears, nose, mouth, throat, and face: Denies mucositis or sore throat Respiratory: Denies cough, dyspnea or wheezes Cardiovascular: Denies palpitation, chest discomfort Gastrointestinal:  Denies nausea, heartburn or change in bowel habits Skin: Denies abnormal skin rashes Lymphatics: Denies new lymphadenopathy or easy bruising Neurological:Denies numbness, tingling or new weaknesses Behavioral/Psych: Mood is  stable, no new changes  Extremities: No lower extremity edema Breast: Left chest wall pain and discomfort as well as a palpable nodule All other systems were reviewed with the patient and are negative.  I have reviewed the past medical history, past surgical history, social history and family history with the patient and they are unchanged from previous note.  ALLERGIES:  is allergic to fentanyl; tetracyclines & related; tylagesic [acetaminophen]; other; clams [shellfish allergy]; nsaids; and pentazocine lactate.  MEDICATIONS:  Current Outpatient Medications  Medication Sig Dispense Refill  . albuterol (PROVENTIL HFA;VENTOLIN HFA) 108 (90 BASE) MCG/ACT inhaler Inhale 1 puff into the lungs every 6 (six) hours as needed for wheezing or shortness of breath.     Marland Kitchen atorvastatin (LIPITOR) 20 MG tablet Take 20 mg by mouth daily before breakfast.    . cetirizine (ZYRTEC ALLERGY) 10 MG tablet Take 1 tablet (10 mg total) by mouth daily. (Patient taking differently: Take 10 mg by mouth every evening. )    . cholecalciferol (VITAMIN D) 1000 units tablet Take 1,000 Units by mouth every evening.    . cholestyramine (QUESTRAN) 4 GM/DOSE powder Take 0.75 packets by mouth daily before breakfast. 3/4 scoop (MUST TAKE 1 hour after dicyclomine)    . dicyclomine (BENTYL) 10 MG capsule Take 10 mg by mouth daily at 6 (six) AM. (1st thing in the morning)    . Multiple Vitamins-Minerals (MULTIVITAMIN GUMMIES WOMENS PO) Take 2 tablets by mouth every evening. One-A-Day Women's    . Omega-3 Fatty Acids (FISH OIL PO) Take 720 mg by mouth every evening.    Marland Kitchen  oxyCODONE (OXY IR/ROXICODONE) 5 MG immediate release tablet Take 1-2 tablets (5-10 mg total) by mouth every 4 (four) hours as needed for moderate pain. (Patient not taking: Reported on 09/01/2016) 30 tablet 0  . Polyethyl Glycol-Propyl Glycol (SYSTANE) 0.4-0.3 % SOLN Place 1 drop into both eyes daily as needed (for dry/irritated eyes.).    Marland Kitchen traMADol (ULTRAM) 50 MG  tablet Take 50 mg by mouth every 6 (six) hours as needed (for pain.).     No current facility-administered medications for this visit.     PHYSICAL EXAMINATION: ECOG PERFORMANCE STATUS: 1 - Symptomatic but completely ambulatory  Vitals:   03/14/17 1030  BP: (!) 148/63  Pulse: 86  Resp: 20  Temp: 98.4 F (36.9 C)  SpO2: 96%   Filed Weights   03/14/17 1030  Weight: 205 lb (93 kg)    GENERAL:alert, no distress and comfortable SKIN: skin color, texture, turgor are normal, no rashes or significant lesions EYES: normal, Conjunctiva are pink and non-injected, sclera clear OROPHARYNX:no exudate, no erythema and lips, buccal mucosa, and tongue normal  NECK: supple, thyroid normal size, non-tender, without nodularity LYMPH:  no palpable lymphadenopathy in the cervical, axillary or inguinal LUNGS: clear to auscultation and percussion with normal breathing effort HEART: regular rate & rhythm and no murmurs and no lower extremity edema ABDOMEN:abdomen soft, non-tender and normal bowel sounds MUSCULOSKELETAL:no cyanosis of digits and no clubbing  NEURO: alert & oriented x 3 with fluent speech, no focal motor/sensory deficits EXTREMITIES: No lower extremity edema BREAST: Left chest wall nodule (exam performed in the presence of a chaperone)  LABORATORY DATA:  I have reviewed the data as listed   Chemistry      Component Value Date/Time   NA 134 (L) 08/19/2016 0625   K 4.2 08/19/2016 0625   CL 105 08/19/2016 0625   CO2 20 (L) 08/19/2016 0625   BUN 12 08/19/2016 0625   CREATININE 0.97 08/19/2016 0625      Component Value Date/Time   CALCIUM 8.0 (L) 08/19/2016 0625   ALKPHOS 53 04/11/2014 1305   AST 36 04/11/2014 1305   ALT 30 04/11/2014 1305   BILITOT 0.5 04/11/2014 1305       Lab Results  Component Value Date   WBC 6.2 08/16/2016   HGB 13.1 08/16/2016   HCT 39.4 08/16/2016   MCV 95.6 08/16/2016   PLT 180 08/16/2016   NEUTROABS 6.4 04/11/2014    ASSESSMENT & PLAN:    Ductal carcinoma in situ (DCIS) of left breast Left breast DCIS ER/PR negative high-grade status post lumpectomy July 2014 currently on adjuvant tamoxifen since September 2014 discontinued May 2018.  Recurrence: 07/25/2016: Suspicious 0.9 cm group of calcifications lower inner quadrant left breast biopsy-proven to be ER/PR negative high-grade DCIS  08/22/2016: Left simple mastectomy: DCIS with necrosis and calcifications, grade 3, margins negative, 0/1 lymph node negative, ER 0%, PR 0% stage 0 ----------------------------------------------------------- Recommendation: No role of antiestrogen therapy Surveillance: 1. Breast Exam: 03/14/17: Scar tissue along the left chest wall where she had a mastectomy appears to be firm but no suspicious lumps or nodules are felt.  The scar tissue appears to be tight and tense that is causing her discomfort.  She also experiences charley horses in the left chest wall 2. Mammogram Rt Breast to be done May 2019 Return to clinic annually for breast exams and follow-up   I spent 25 minutes talking to the patient of which more than half was spent in counseling and coordination of care.  No orders of the defined types were placed in this encounter.  The patient has a good understanding of the overall plan. she agrees with it. she will call with any problems that may develop before the next visit here.   Harriette Ohara, MD 03/14/17

## 2017-03-14 NOTE — Telephone Encounter (Signed)
Gave patient AVs and calendar of upcoming January 2019 appointments.

## 2017-04-18 DIAGNOSIS — D0512 Intraductal carcinoma in situ of left breast: Secondary | ICD-10-CM | POA: Diagnosis not present

## 2017-05-24 DIAGNOSIS — D2372 Other benign neoplasm of skin of left lower limb, including hip: Secondary | ICD-10-CM | POA: Diagnosis not present

## 2017-05-24 DIAGNOSIS — D485 Neoplasm of uncertain behavior of skin: Secondary | ICD-10-CM | POA: Diagnosis not present

## 2017-05-24 DIAGNOSIS — L905 Scar conditions and fibrosis of skin: Secondary | ICD-10-CM | POA: Diagnosis not present

## 2017-05-24 DIAGNOSIS — D2239 Melanocytic nevi of other parts of face: Secondary | ICD-10-CM | POA: Diagnosis not present

## 2017-05-24 DIAGNOSIS — L738 Other specified follicular disorders: Secondary | ICD-10-CM | POA: Diagnosis not present

## 2017-05-24 DIAGNOSIS — D2262 Melanocytic nevi of left upper limb, including shoulder: Secondary | ICD-10-CM | POA: Diagnosis not present

## 2017-07-13 DIAGNOSIS — M859 Disorder of bone density and structure, unspecified: Secondary | ICD-10-CM | POA: Diagnosis not present

## 2017-07-13 DIAGNOSIS — R82998 Other abnormal findings in urine: Secondary | ICD-10-CM | POA: Diagnosis not present

## 2017-07-13 DIAGNOSIS — E7849 Other hyperlipidemia: Secondary | ICD-10-CM | POA: Diagnosis not present

## 2017-07-20 DIAGNOSIS — C50919 Malignant neoplasm of unspecified site of unspecified female breast: Secondary | ICD-10-CM | POA: Diagnosis not present

## 2017-07-20 DIAGNOSIS — Z6834 Body mass index (BMI) 34.0-34.9, adult: Secondary | ICD-10-CM | POA: Diagnosis not present

## 2017-07-20 DIAGNOSIS — Z1389 Encounter for screening for other disorder: Secondary | ICD-10-CM | POA: Diagnosis not present

## 2017-07-20 DIAGNOSIS — Z23 Encounter for immunization: Secondary | ICD-10-CM | POA: Diagnosis not present

## 2017-07-20 DIAGNOSIS — E7849 Other hyperlipidemia: Secondary | ICD-10-CM | POA: Diagnosis not present

## 2017-07-20 DIAGNOSIS — Z0000 Encounter for general adult medical examination without abnormal findings: Secondary | ICD-10-CM | POA: Diagnosis not present

## 2017-07-20 DIAGNOSIS — R0789 Other chest pain: Secondary | ICD-10-CM | POA: Diagnosis not present

## 2017-07-20 DIAGNOSIS — M859 Disorder of bone density and structure, unspecified: Secondary | ICD-10-CM | POA: Diagnosis not present

## 2017-07-20 DIAGNOSIS — K589 Irritable bowel syndrome without diarrhea: Secondary | ICD-10-CM | POA: Diagnosis not present

## 2017-07-20 DIAGNOSIS — W57XXXA Bitten or stung by nonvenomous insect and other nonvenomous arthropods, initial encounter: Secondary | ICD-10-CM | POA: Diagnosis not present

## 2017-07-28 DIAGNOSIS — Z1212 Encounter for screening for malignant neoplasm of rectum: Secondary | ICD-10-CM | POA: Diagnosis not present

## 2017-08-01 ENCOUNTER — Ambulatory Visit
Admission: RE | Admit: 2017-08-01 | Discharge: 2017-08-01 | Disposition: A | Discharge status: Home / Self Care | Payer: Medicare Other | Source: Ambulatory Visit | Attending: Hematology and Oncology | Admitting: Hematology and Oncology

## 2017-08-01 DIAGNOSIS — D0512 Intraductal carcinoma in situ of left breast: Secondary | ICD-10-CM

## 2017-08-01 DIAGNOSIS — Z1231 Encounter for screening mammogram for malignant neoplasm of breast: Secondary | ICD-10-CM | POA: Diagnosis not present

## 2017-08-31 ENCOUNTER — Inpatient Hospital Stay (HOSPITAL_BASED_OUTPATIENT_CLINIC_OR_DEPARTMENT_OTHER): Payer: Medicare Other | Admitting: Hematology and Oncology

## 2017-08-31 ENCOUNTER — Other Ambulatory Visit: Payer: Self-pay

## 2017-08-31 ENCOUNTER — Inpatient Hospital Stay: Payer: Medicare Other | Attending: Hematology and Oncology | Admitting: Hematology and Oncology

## 2017-08-31 ENCOUNTER — Ambulatory Visit: Payer: Medicare Other | Admitting: Hematology and Oncology

## 2017-08-31 DIAGNOSIS — Z86000 Personal history of in-situ neoplasm of breast: Secondary | ICD-10-CM | POA: Insufficient documentation

## 2017-08-31 DIAGNOSIS — D0512 Intraductal carcinoma in situ of left breast: Secondary | ICD-10-CM

## 2017-08-31 NOTE — Progress Notes (Signed)
Patient Care Team: Velna Hatchet, MD as PCP - General (Internal Medicine)  DIAGNOSIS:  Encounter Diagnosis  Name Primary?  . Ductal carcinoma in situ (DCIS) of left breast     SUMMARY OF ONCOLOGIC HISTORY:   Ductal carcinoma in situ (DCIS) of left breast   09/2012 Initial Diagnosis    Mammographically detected left breast abnormality biopsy-proven high-grade  DCIS ER/PR/ HER-2 negative status post lumpectomy (SLN neg) followed by radiation (in [place])      10/2012 -  Anti-estrogen oral therapy    Tamoxifen 20 mg daily started in [place] (patient wishes to take it in spite of her DCIS being ER/PR negative)      07/26/2016 Relapse/Recurrence    Left breast suspicious 0.9 cm group of linear branching calcifications, biopsy revealed DCIS with calcifications ER 0%, PR 0%      08/22/2016 Surgery    Left simple mastectomy: DCIS with necrosis and calcifications, grade 3, margins negative, 0/1 lymph node negative, ER 0%, PR 0% stage 0       CHIEF COMPLIANT: Complains of itching along the left mastectomy scar  INTERVAL HISTORY: Colleen Hancock is a 72 year old with above-mentioned history of left breast DCIS who had a mastectomy.  It was ER PR negative so she did not need antiestrogen therapy.  She is here urgently because she developed itching along the left mastectomy scar and nodularity.  She had a mammogram of the right breast in May 2019 which was normal.  REVIEW OF SYSTEMS:   Constitutional: Denies fevers, chills or abnormal weight loss Eyes: Denies blurriness of vision Ears, nose, mouth, throat, and face: Denies mucositis or sore throat Respiratory: Denies cough, dyspnea or wheezes Cardiovascular: Denies palpitation, chest discomfort Gastrointestinal:  Denies nausea, heartburn or change in bowel habits Skin: Denies abnormal skin rashes Lymphatics: Denies new lymphadenopathy or easy bruising Neurological:Denies numbness, tingling or new  weaknesses Behavioral/Psych: Mood is stable, no new changes  Extremities: No lower extremity edema Breast: Itching along the left mastectomy scar All other systems were reviewed with the patient and are negative.  I have reviewed the past medical history, past surgical history, social history and family history with the patient and they are unchanged from previous note.  ALLERGIES:  is allergic to fentanyl; tetracyclines & related; tylagesic [acetaminophen]; other; clams [shellfish allergy]; nsaids; and pentazocine lactate.  MEDICATIONS:  Current Outpatient Medications  Medication Sig Dispense Refill  . albuterol (PROVENTIL HFA;VENTOLIN HFA) 108 (90 BASE) MCG/ACT inhaler Inhale 1 puff into the lungs every 6 (six) hours as needed for wheezing or shortness of breath.     Marland Kitchen atorvastatin (LIPITOR) 20 MG tablet Take 20 mg by mouth daily before breakfast.    . cetirizine (ZYRTEC ALLERGY) 10 MG tablet Take 1 tablet (10 mg total) by mouth daily. (Patient taking differently: Take 10 mg by mouth every evening. )    . cholecalciferol (VITAMIN D) 1000 units tablet Take 1,000 Units by mouth every evening.    . cholestyramine (QUESTRAN) 4 GM/DOSE powder Take 0.75 packets by mouth daily before breakfast. 3/4 scoop (MUST TAKE 1 hour after dicyclomine)    . dicyclomine (BENTYL) 10 MG capsule Take 10 mg by mouth daily at 6 (six) AM. (1st thing in the morning)    . Multiple Vitamins-Minerals (MULTIVITAMIN GUMMIES WOMENS PO) Take 2 tablets by mouth every evening. One-A-Day Women's    . Omega-3 Fatty Acids (FISH OIL PO) Take 720 mg by mouth every evening.    Marland Kitchen oxyCODONE (OXY IR/ROXICODONE) 5  MG immediate release tablet Take 1-2 tablets (5-10 mg total) by mouth every 4 (four) hours as needed for moderate pain. (Patient not taking: Reported on 09/01/2016) 30 tablet 0  . Polyethyl Glycol-Propyl Glycol (SYSTANE) 0.4-0.3 % SOLN Place 1 drop into both eyes daily as needed (for dry/irritated eyes.).    Marland Kitchen traMADol (ULTRAM)  50 MG tablet Take 50 mg by mouth every 6 (six) hours as needed (for pain.).     No current facility-administered medications for this visit.     PHYSICAL EXAMINATION: ECOG PERFORMANCE STATUS: 1 - Symptomatic but completely ambulatory  Vitals:   08/31/17 1522  BP: (!) 151/84  Pulse: 86  Resp: 18  Temp: 98.4 F (36.9 C)  SpO2: 94%   Filed Weights   08/31/17 1522  Weight: 204 lb (92.5 kg)    GENERAL:alert, no distress and comfortable SKIN: skin color, texture, turgor are normal, no rashes or significant lesions EYES: normal, Conjunctiva are pink and non-injected, sclera clear OROPHARYNX:no exudate, no erythema and lips, buccal mucosa, and tongue normal  NECK: supple, thyroid normal size, non-tender, without nodularity LYMPH:  no palpable lymphadenopathy in the cervical, axillary or inguinal LUNGS: clear to auscultation and percussion with normal breathing effort HEART: regular rate & rhythm and no murmurs and no lower extremity edema ABDOMEN:abdomen soft, non-tender and normal bowel sounds MUSCULOSKELETAL:no cyanosis of digits and no clubbing  NEURO: alert & oriented x 3 with fluent speech, no focal motor/sensory deficits EXTREMITIES: No lower extremity edema BREAST: (exam performed in the presence of a chaperone)  LABORATORY DATA:  I have reviewed the data as listed CMP Latest Ref Rng & Units 08/19/2016 08/16/2016 04/12/2014  Glucose 65 - 99 mg/dL 122(H) 169(H) 146(H)  BUN 6 - 20 mg/dL _0 Creatinine 0.44 - 1.00 mg/dL 0.97 1.10(H) 1.12(H)  Sodium 135 - 145 mmol/L 134(L) 139 138  Potassium 3.5 - 5.1 mmol/L 4.2 3.8 3.6  Chloride 101 - 111 mmol/L 105 105 109  CO2 22 - 32 mmol/L 20(L) 26 20  Calcium 8.9 - 10.3 mg/dL 8.0(L) 9.2 7.7(L)  Total Protein 6.0 - 8.3 g/dL - - -  Total Bilirubin 0.3 - 1.2 mg/dL - - -  Alkaline Phos 39 - 117 U/L - - -  AST 0 - 37 U/L - - -  ALT 0 - 35 U/L - - -    Lab Results  Component Value Date   WBC 6.2 08/16/2016   HGB 13.1 08/16/2016    HCT 39.4 08/16/2016   MCV 95.6 08/16/2016   PLT 180 08/16/2016   NEUTROABS 6.4 04/11/2014    ASSESSMENT & PLAN:  Ductal carcinoma in situ (DCIS) of left breast Left breast DCIS ER/PR negative high-grade status post lumpectomy July 2014 took adjuvant tamoxifen since September 2014discontinued May 2018.  Recurrence: 07/25/2016: Suspicious 0.9 cm group of calcifications lower inner quadrant left breast biopsy-proven to be ER/PR negative high-grade DCIS  08/22/2016:Left simple mastectomy: DCIS with necrosis and calcifications, grade 3, margins negative, 0/1 lymph node negative, ER 0%, PR 0% stage 0 ----------------------------------------------------------- Recommendation: No role of antiestrogen therapy  Breast cancer surveillance: 1.  Mammogram right breast 08/01/2017: No evidence of malignancy. Left mastectomy scar itching: This is because of irritation from breast prosthesis. I gave her a new prescription for breast prosthesis with instruction to change the material from nylon. There is no concern for malignancy on clinical examination. Patient's husband has been going through a lot of difficulties including having had an amputation recently. She has been  struggling with all of his health issues.  No orders of the defined types were placed in this encounter.  The patient has a good understanding of the overall plan. she agrees with it. she will call with any problems that may develop before the next visit here.   Harriette Ohara, MD 08/31/17

## 2017-08-31 NOTE — Progress Notes (Unsigned)
Patient Care Team: Velna Hatchet, MD as PCP - General (Internal Medicine)  DIAGNOSIS:  Encounter Diagnosis  Name Primary?  . Ductal carcinoma in situ (DCIS) of left breast     SUMMARY OF ONCOLOGIC HISTORY:   Ductal carcinoma in situ (DCIS) of left breast   09/2012 Initial Diagnosis    Mammographically detected left breast abnormality biopsy-proven high-grade  DCIS ER/PR/ HER-2 negative status post lumpectomy (SLN neg) followed by radiation (in [place])      10/2012 -  Anti-estrogen oral therapy    Tamoxifen 20 mg daily started in [place] (patient wishes to take it in spite of her DCIS being ER/PR negative)      07/26/2016 Relapse/Recurrence    Left breast suspicious 0.9 cm group of linear branching calcifications, biopsy revealed DCIS with calcifications ER 0%, PR 0%      08/22/2016 Surgery    Left simple mastectomy: DCIS with necrosis and calcifications, grade 3, margins negative, 0/1 lymph node negative, ER 0%, PR 0% stage 0       CHIEF COMPLIANT: Came in urgently with the complaints of itching along the left mastectomy scar  INTERVAL HISTORY: Colleen Hancock is a 72 year old with above-mentioned history of left breast DCIS treated with mastectomy and is here urgently because she developed itching sensation and she was not sure why that is happening.  She decided for the last 3 to 3 months.  She has neglected it primarily because her husband is some lot of health issues and recently had an amputation.  REVIEW OF SYSTEMS:   Constitutional: Denies fevers, chills or abnormal weight loss Eyes: Denies blurriness of vision Ears, nose, mouth, throat, and face: Denies mucositis or sore throat Respiratory: Denies cough, dyspnea or wheezes Cardiovascular: Denies palpitation, chest discomfort Gastrointestinal:  Denies nausea, heartburn or change in bowel habits Skin: Denies abnormal skin rashes Lymphatics: Denies new lymphadenopathy or easy bruising Neurological:Denies  numbness, tingling or new weaknesses Behavioral/Psych: Mood is stable, no new changes  Extremities: No lower extremity edema Breast: Itching of the left mastectomy scar All other systems were reviewed with the patient and are negative.  I have reviewed the past medical history, past surgical history, social history and family history with the patient and they are unchanged from previous note.  ALLERGIES:  is allergic to fentanyl; tetracyclines & related; tylagesic [acetaminophen]; other; clams [shellfish allergy]; nsaids; and pentazocine lactate.  MEDICATIONS:  Current Outpatient Medications  Medication Sig Dispense Refill  . albuterol (PROVENTIL HFA;VENTOLIN HFA) 108 (90 BASE) MCG/ACT inhaler Inhale 1 puff into the lungs every 6 (six) hours as needed for wheezing or shortness of breath.     Marland Kitchen atorvastatin (LIPITOR) 20 MG tablet Take 20 mg by mouth daily before breakfast.    . cetirizine (ZYRTEC ALLERGY) 10 MG tablet Take 1 tablet (10 mg total) by mouth daily. (Patient taking differently: Take 10 mg by mouth every evening. )    . cholecalciferol (VITAMIN D) 1000 units tablet Take 1,000 Units by mouth every evening.    . cholestyramine (QUESTRAN) 4 GM/DOSE powder Take 0.75 packets by mouth daily before breakfast. 3/4 scoop (MUST TAKE 1 hour after dicyclomine)    . dicyclomine (BENTYL) 10 MG capsule Take 10 mg by mouth daily at 6 (six) AM. (1st thing in the morning)    . Multiple Vitamins-Minerals (MULTIVITAMIN GUMMIES WOMENS PO) Take 2 tablets by mouth every evening. One-A-Day Women's    . Omega-3 Fatty Acids (FISH OIL PO) Take 720 mg by mouth every evening.    Marland Kitchen  oxyCODONE (OXY IR/ROXICODONE) 5 MG immediate release tablet Take 1-2 tablets (5-10 mg total) by mouth every 4 (four) hours as needed for moderate pain. (Patient not taking: Reported on 09/01/2016) 30 tablet 0  . Polyethyl Glycol-Propyl Glycol (SYSTANE) 0.4-0.3 % SOLN Place 1 drop into both eyes daily as needed (for dry/irritated eyes.).     Marland Kitchen traMADol (ULTRAM) 50 MG tablet Take 50 mg by mouth every 6 (six) hours as needed (for pain.).     No current facility-administered medications for this visit.     PHYSICAL EXAMINATION: ECOG PERFORMANCE STATUS: 1 - Symptomatic but completely ambulatory  There were no vitals filed for this visit. There were no vitals filed for this visit.  GENERAL:alert, no distress and comfortable SKIN: skin color, texture, turgor are normal, no rashes or significant lesions EYES: normal, Conjunctiva are pink and non-injected, sclera clear OROPHARYNX:no exudate, no erythema and lips, buccal mucosa, and tongue normal  NECK: supple, thyroid normal size, non-tender, without nodularity LYMPH:  no palpable lymphadenopathy in the cervical, axillary or inguinal LUNGS: clear to auscultation and percussion with normal breathing effort HEART: regular rate & rhythm and no murmurs and no lower extremity edema ABDOMEN:abdomen soft, non-tender and normal bowel sounds MUSCULOSKELETAL:no cyanosis of digits and no clubbing  NEURO: alert & oriented x 3 with fluent speech, no focal motor/sensory deficits EXTREMITIES: No lower extremity edema BREAST: No palpable lumps or nodules in the left mastectomy scar.  There is no evidence of any skin rash.  (exam performed in the presence of a chaperone)  LABORATORY DATA:  I have reviewed the data as listed CMP Latest Ref Rng & Units 08/19/2016 08/16/2016 04/12/2014  Glucose 65 - 99 mg/dL 122(H) 169(H) 146(H)  BUN 6 - 20 mg/dL '12 17 9  ' Creatinine 0.44 - 1.00 mg/dL 0.97 1.10(H) 1.12(H)  Sodium 135 - 145 mmol/L 134(L) 139 138  Potassium 3.5 - 5.1 mmol/L 4.2 3.8 3.6  Chloride 101 - 111 mmol/L 105 105 109  CO2 22 - 32 mmol/L 20(L) 26 20  Calcium 8.9 - 10.3 mg/dL 8.0(L) 9.2 7.7(L)  Total Protein 6.0 - 8.3 g/dL - - -  Total Bilirubin 0.3 - 1.2 mg/dL - - -  Alkaline Phos 39 - 117 U/L - - -  AST 0 - 37 U/L - - -  ALT 0 - 35 U/L - - -    Lab Results  Component Value Date   WBC  6.2 08/16/2016   HGB 13.1 08/16/2016   HCT 39.4 08/16/2016   MCV 95.6 08/16/2016   PLT 180 08/16/2016   NEUTROABS 6.4 04/11/2014    ASSESSMENT & PLAN:  Ductal carcinoma in situ (DCIS) of left breast Left breast DCIS ER/PR negative high-grade status post lumpectomy July 2014 took adjuvant tamoxifen since September 2014discontinued May 2018.  Recurrence: 07/25/2016: Suspicious 0.9 cm group of calcifications lower inner quadrant left breast biopsy-proven to be ER/PR negative high-grade DCIS  08/22/2016:Left simple mastectomy: DCIS with necrosis and calcifications, grade 3, margins negative, 0/1 lymph node negative, ER 0%, PR 0% stage 0 ----------------------------------------------------------- Recommendation: No role of antiestrogen therapy  Breast cancer surveillance: 1.  Mammogram right breast 08/01/2017: No evidence of malignancy.  Itching related to prosthesis: I instructed her to change the prosthesis material.  I gave her a new prescription so she can be fitted for a new bra. There is no concern for recurrent breast cancer. We will plan to see her back in 6 months. Patient's husband has been going through a lot of difficult  time including an amputation of the leg.  She has been struggling with his health issues.    No orders of the defined types were placed in this encounter.  The patient has a good understanding of the overall plan. she agrees with it. she will call with any problems that may develop before the next visit here.   Harriette Ohara, MD 08/31/17

## 2017-08-31 NOTE — Assessment & Plan Note (Signed)
Left breast DCIS ER/PR negative high-grade status post lumpectomy July 2014 took adjuvant tamoxifen since September 2014discontinued May 2018.  Recurrence: 07/25/2016: Suspicious 0.9 cm group of calcifications lower inner quadrant left breast biopsy-proven to be ER/PR negative high-grade DCIS  08/22/2016:Left simple mastectomy: DCIS with necrosis and calcifications, grade 3, margins negative, 0/1 lymph node negative, ER 0%, PR 0% stage 0 ----------------------------------------------------------- Recommendation: No role of antiestrogen therapy  Breast cancer surveillance: 1.  Mammogram right breast 08/01/2017: No evidence of malignancy.  Itching related to prosthesis: I instructed her to change the prosthesis material.  I gave her a new prescription so she can be fitted for a new bra. There is no concern for recurrent breast cancer. We will plan to see her back in 6 months. Patient's husband has been going through a lot of difficult time including an amputation of the leg.  She has been struggling with his health issues.

## 2017-08-31 NOTE — Assessment & Plan Note (Signed)
Left breast DCIS ER/PR negative high-grade status post lumpectomy July 2014 took adjuvant tamoxifen since September 2014discontinued May 2018.  Recurrence: 07/25/2016: Suspicious 0.9 cm group of calcifications lower inner quadrant left breast biopsy-proven to be ER/PR negative high-grade DCIS  08/22/2016:Left simple mastectomy: DCIS with necrosis and calcifications, grade 3, margins negative, 0/1 lymph node negative, ER 0%, PR 0% stage 0 ----------------------------------------------------------- Recommendation: No role of antiestrogen therapy  Breast cancer surveillance: 1.  Mammogram right breast 08/01/2017: No evidence of malignancy.

## 2017-10-18 DIAGNOSIS — M859 Disorder of bone density and structure, unspecified: Secondary | ICD-10-CM | POA: Diagnosis not present

## 2017-11-11 DIAGNOSIS — Z23 Encounter for immunization: Secondary | ICD-10-CM | POA: Diagnosis not present

## 2018-01-10 DIAGNOSIS — D0512 Intraductal carcinoma in situ of left breast: Secondary | ICD-10-CM | POA: Diagnosis not present

## 2018-01-17 DIAGNOSIS — K589 Irritable bowel syndrome without diarrhea: Secondary | ICD-10-CM | POA: Diagnosis not present

## 2018-01-17 DIAGNOSIS — H8113 Benign paroxysmal vertigo, bilateral: Secondary | ICD-10-CM | POA: Diagnosis not present

## 2018-01-17 DIAGNOSIS — J309 Allergic rhinitis, unspecified: Secondary | ICD-10-CM | POA: Diagnosis not present

## 2018-01-17 DIAGNOSIS — I951 Orthostatic hypotension: Secondary | ICD-10-CM | POA: Diagnosis not present

## 2018-02-19 DIAGNOSIS — H8111 Benign paroxysmal vertigo, right ear: Secondary | ICD-10-CM | POA: Diagnosis not present

## 2018-02-19 DIAGNOSIS — R262 Difficulty in walking, not elsewhere classified: Secondary | ICD-10-CM | POA: Diagnosis not present

## 2018-02-19 DIAGNOSIS — R293 Abnormal posture: Secondary | ICD-10-CM | POA: Diagnosis not present

## 2018-02-19 DIAGNOSIS — R2681 Unsteadiness on feet: Secondary | ICD-10-CM | POA: Diagnosis not present

## 2018-03-02 DIAGNOSIS — R030 Elevated blood-pressure reading, without diagnosis of hypertension: Secondary | ICD-10-CM | POA: Diagnosis not present

## 2018-03-02 DIAGNOSIS — R05 Cough: Secondary | ICD-10-CM | POA: Diagnosis not present

## 2018-03-02 DIAGNOSIS — J329 Chronic sinusitis, unspecified: Secondary | ICD-10-CM | POA: Diagnosis not present

## 2018-03-02 DIAGNOSIS — Z6834 Body mass index (BMI) 34.0-34.9, adult: Secondary | ICD-10-CM | POA: Diagnosis not present

## 2018-03-09 DIAGNOSIS — H6980 Other specified disorders of Eustachian tube, unspecified ear: Secondary | ICD-10-CM | POA: Diagnosis not present

## 2018-03-09 DIAGNOSIS — R42 Dizziness and giddiness: Secondary | ICD-10-CM | POA: Diagnosis not present

## 2018-03-09 DIAGNOSIS — H6981 Other specified disorders of Eustachian tube, right ear: Secondary | ICD-10-CM | POA: Diagnosis not present

## 2018-03-13 ENCOUNTER — Ambulatory Visit: Payer: Medicare Other | Admitting: Hematology and Oncology

## 2018-03-19 ENCOUNTER — Inpatient Hospital Stay: Payer: Medicare Other | Attending: Hematology and Oncology | Admitting: Hematology and Oncology

## 2018-03-19 ENCOUNTER — Telehealth: Payer: Self-pay | Admitting: Hematology and Oncology

## 2018-03-19 DIAGNOSIS — Z170 Estrogen receptor positive status [ER+]: Secondary | ICD-10-CM | POA: Diagnosis not present

## 2018-03-19 DIAGNOSIS — D0512 Intraductal carcinoma in situ of left breast: Secondary | ICD-10-CM | POA: Diagnosis not present

## 2018-03-19 DIAGNOSIS — Z79899 Other long term (current) drug therapy: Secondary | ICD-10-CM | POA: Insufficient documentation

## 2018-03-19 DIAGNOSIS — Z79810 Long term (current) use of selective estrogen receptor modulators (SERMs): Secondary | ICD-10-CM | POA: Insufficient documentation

## 2018-03-19 DIAGNOSIS — I890 Lymphedema, not elsewhere classified: Secondary | ICD-10-CM | POA: Insufficient documentation

## 2018-03-19 NOTE — Progress Notes (Signed)
Patient Care Team: Velna Hatchet, MD as PCP - General (Internal Medicine)  DIAGNOSIS:  Encounter Diagnosis  Name Primary?  . Ductal carcinoma in situ (DCIS) of left breast     SUMMARY OF ONCOLOGIC HISTORY:   Ductal carcinoma in situ (DCIS) of left breast   09/2012 Initial Diagnosis    Mammographically detected left breast abnormality biopsy-proven high-grade  DCIS ER/PR/ HER-2 negative status post lumpectomy (SLN neg) followed by radiation (in [place])    10/2012 -  Anti-estrogen oral therapy    Tamoxifen 20 mg daily started in [place] (patient wishes to take it in spite of her DCIS being ER/PR negative)    07/26/2016 Relapse/Recurrence    Left breast suspicious 0.9 cm group of linear branching calcifications, biopsy revealed DCIS with calcifications ER 0%, PR 0%    08/22/2016 Surgery    Left simple mastectomy: DCIS with necrosis and calcifications, grade 3, margins negative, 0/1 lymph node negative, ER 0%, PR 0% stage 0     CHIEF COMPLIANT: Annual follow-up and surveillance of history of left breast cancer  INTERVAL HISTORY: Colleen Hancock is a 73 year old with above-mentioned history of left breast DCIS who underwent mastectomy and is here for annual checkup.  She continues to struggle helping her husband recover from his below-knee amputation.  She has to lift wheelchairs on him periodically and she has been hurting in her left shoulder.  She has had left arm lymphedema for which Dr. Donne Hazel prescribed sleeve but she has not been wearing it because it is been too tight.  REVIEW OF SYSTEMS:   Constitutional: Denies fevers, chills or abnormal weight loss Eyes: Denies blurriness of vision Ears, nose, mouth, throat, and face: Denies mucositis or sore throat Respiratory: Denies cough, dyspnea or wheezes Cardiovascular: Denies palpitation, chest discomfort Gastrointestinal:  Denies nausea, heartburn or change in bowel habits Skin: Denies abnormal skin  rashes Lymphatics: Denies new lymphadenopathy or easy bruising Neurological:Denies numbness, tingling or new weaknesses Behavioral/Psych: Emotional exhaustion Extremities: No lower extremity edema Breast: Left mastectomy All other systems were reviewed with the patient and are negative.  I have reviewed the past medical history, past surgical history, social history and family history with the patient and they are unchanged from previous note.  ALLERGIES:  is allergic to fentanyl; tetracyclines & related; tylagesic [acetaminophen]; other; clams [shellfish allergy]; nsaids; and pentazocine lactate.  MEDICATIONS:  Current Outpatient Medications  Medication Sig Dispense Refill  . albuterol (PROVENTIL HFA;VENTOLIN HFA) 108 (90 BASE) MCG/ACT inhaler Inhale 1 puff into the lungs every 6 (six) hours as needed for wheezing or shortness of breath.     Marland Kitchen atorvastatin (LIPITOR) 20 MG tablet Take 20 mg by mouth daily before breakfast.    . cetirizine (ZYRTEC ALLERGY) 10 MG tablet Take 1 tablet (10 mg total) by mouth daily. (Patient taking differently: Take 10 mg by mouth every evening. )    . cholecalciferol (VITAMIN D) 1000 units tablet Take 1,000 Units by mouth every evening.    . cholestyramine (QUESTRAN) 4 GM/DOSE powder Take 0.75 packets by mouth daily before breakfast. 3/4 scoop (MUST TAKE 1 hour after dicyclomine)    . dicyclomine (BENTYL) 10 MG capsule Take 10 mg by mouth daily at 6 (six) AM. (1st thing in the morning)    . Multiple Vitamins-Minerals (MULTIVITAMIN GUMMIES WOMENS PO) Take 2 tablets by mouth every evening. One-A-Day Women's    . Omega-3 Fatty Acids (FISH OIL PO) Take 720 mg by mouth every evening.    Marland Kitchen oxyCODONE (  OXY IR/ROXICODONE) 5 MG immediate release tablet Take 1-2 tablets (5-10 mg total) by mouth every 4 (four) hours as needed for moderate pain. (Patient not taking: Reported on 09/01/2016) 30 tablet 0  . Polyethyl Glycol-Propyl Glycol (SYSTANE) 0.4-0.3 % SOLN Place 1 drop into  both eyes daily as needed (for dry/irritated eyes.).    Marland Kitchen traMADol (ULTRAM) 50 MG tablet Take 50 mg by mouth every 6 (six) hours as needed (for pain.).     No current facility-administered medications for this visit.     PHYSICAL EXAMINATION: ECOG PERFORMANCE STATUS: 1 - Symptomatic but completely ambulatory  Vitals:   03/19/18 1137  BP: (!) 147/55  Pulse: 86  Resp: 17  Temp: 99.6 F (37.6 C)  SpO2: 93%   Filed Weights   03/19/18 1137  Weight: 202 lb 1.6 oz (91.7 kg)    GENERAL:alert, no distress and comfortable SKIN: skin color, texture, turgor are normal, no rashes or significant lesions EYES: normal, Conjunctiva are pink and non-injected, sclera clear OROPHARYNX:no exudate, no erythema and lips, buccal mucosa, and tongue normal  NECK: supple, thyroid normal size, non-tender, without nodularity LYMPH:  no palpable lymphadenopathy in the cervical, axillary or inguinal LUNGS: clear to auscultation and percussion with normal breathing effort HEART: regular rate & rhythm and no murmurs and no lower extremity edema ABDOMEN:abdomen soft, non-tender and normal bowel sounds MUSCULOSKELETAL:no cyanosis of digits and no clubbing  NEURO: alert & oriented x 3 with fluent speech, no focal motor/sensory deficits EXTREMITIES: No lower extremity edema BREAST: Left mastectomy scar is intact no palpable lumps or nodules in the right breast. (exam performed in the presence of a chaperone)  LABORATORY DATA:  I have reviewed the data as listed CMP Latest Ref Rng & Units 08/19/2016 08/16/2016 04/12/2014  Glucose 65 - 99 mg/dL 122(H) 169(H) 146(H)  BUN 6 - 20 mg/dL _0 Creatinine 0.44 - 1.00 mg/dL 0.97 1.10(H) 1.12(H)  Sodium 135 - 145 mmol/L 134(L) 139 138  Potassium 3.5 - 5.1 mmol/L 4.2 3.8 3.6  Chloride 101 - 111 mmol/L 105 105 109  CO2 22 - 32 mmol/L 20(L) 26 20  Calcium 8.9 - 10.3 mg/dL 8.0(L) 9.2 7.7(L)  Total Protein 6.0 - 8.3 g/dL - - -  Total Bilirubin 0.3 - 1.2 mg/dL - - -   Alkaline Phos 39 - 117 U/L - - -  AST 0 - 37 U/L - - -  ALT 0 - 35 U/L - - -    Lab Results  Component Value Date   WBC 6.2 08/16/2016   HGB 13.1 08/16/2016   HCT 39.4 08/16/2016   MCV 95.6 08/16/2016   PLT 180 08/16/2016   NEUTROABS 6.4 04/11/2014    ASSESSMENT & PLAN:  Ductal carcinoma in situ (DCIS) of left breast Left breast DCIS ER/PR negative high-grade status post lumpectomy July 2014 took adjuvant tamoxifen since September 2014discontinued May 2018.  Recurrence: 07/25/2016: Suspicious 0.9 cm group of calcifications lower inner quadrant left breast biopsy-proven to be ER/PR negative high-grade DCIS  08/22/2016:Left simple mastectomy: DCIS with necrosis and calcifications, grade 3, margins negative, 0/1 lymph node negative, ER 0%, PR 0% stage 0 ----------------------------------------------------------- Recommendation: No role of antiestrogen therapy  Breast cancer surveillance: 1.  Mammogram right breast 08/01/2017: No evidence of malignancy. 2.  Breast exam 03/19/2018: Benign  Itching related to breast prosthesis: Mostly relieved Her husband has several health issues and she has been helping him.  He had an amputation of the leg. Left arm lymphedema: Encouraged  her to wear the sleeve. Return to clinic in 1 year for follow-up  No orders of the defined types were placed in this encounter.  The patient has a good understanding of the overall plan. she agrees with it. she will call with any problems that may develop before the next visit here.   Harriette Ohara, MD 03/19/18

## 2018-03-19 NOTE — Telephone Encounter (Signed)
Gave avs and calendar ° °

## 2018-03-19 NOTE — Assessment & Plan Note (Signed)
Left breast DCIS ER/PR negative high-grade status post lumpectomy July 2014 took adjuvant tamoxifen since September 2014discontinued May 2018.  Recurrence: 07/25/2016: Suspicious 0.9 cm group of calcifications lower inner quadrant left breast biopsy-proven to be ER/PR negative high-grade DCIS  08/22/2016:Left simple mastectomy: DCIS with necrosis and calcifications, grade 3, margins negative, 0/1 lymph node negative, ER 0%, PR 0% stage 0 ----------------------------------------------------------- Recommendation: No role of antiestrogen therapy  Breast cancer surveillance: 1.  Mammogram right breast 08/01/2017: No evidence of malignancy. 2.  Breast exam 03/19/2018: Benign  Itching related to breast prosthesis Her husband has several health issues and she has been helping him.  He had an amputation of the leg.  Return to clinic in 1 year for follow-up

## 2018-07-13 ENCOUNTER — Other Ambulatory Visit: Payer: Self-pay | Admitting: Hematology and Oncology

## 2018-07-13 ENCOUNTER — Other Ambulatory Visit: Payer: Self-pay | Admitting: *Deleted

## 2018-07-13 DIAGNOSIS — D0512 Intraductal carcinoma in situ of left breast: Secondary | ICD-10-CM

## 2018-07-19 DIAGNOSIS — M859 Disorder of bone density and structure, unspecified: Secondary | ICD-10-CM | POA: Diagnosis not present

## 2018-07-19 DIAGNOSIS — E7849 Other hyperlipidemia: Secondary | ICD-10-CM | POA: Diagnosis not present

## 2018-07-20 DIAGNOSIS — R82998 Other abnormal findings in urine: Secondary | ICD-10-CM | POA: Diagnosis not present

## 2018-07-25 DIAGNOSIS — R030 Elevated blood-pressure reading, without diagnosis of hypertension: Secondary | ICD-10-CM | POA: Diagnosis not present

## 2018-07-25 DIAGNOSIS — E785 Hyperlipidemia, unspecified: Secondary | ICD-10-CM | POA: Diagnosis not present

## 2018-07-25 DIAGNOSIS — M62838 Other muscle spasm: Secondary | ICD-10-CM | POA: Diagnosis not present

## 2018-07-25 DIAGNOSIS — Z0000 Encounter for general adult medical examination without abnormal findings: Secondary | ICD-10-CM | POA: Diagnosis not present

## 2018-07-25 DIAGNOSIS — Z1331 Encounter for screening for depression: Secondary | ICD-10-CM | POA: Diagnosis not present

## 2018-07-25 DIAGNOSIS — D0512 Intraductal carcinoma in situ of left breast: Secondary | ICD-10-CM | POA: Diagnosis not present

## 2018-07-25 DIAGNOSIS — K589 Irritable bowel syndrome without diarrhea: Secondary | ICD-10-CM | POA: Diagnosis not present

## 2018-07-26 ENCOUNTER — Other Ambulatory Visit: Payer: Self-pay | Admitting: Hematology and Oncology

## 2018-07-26 DIAGNOSIS — Z1231 Encounter for screening mammogram for malignant neoplasm of breast: Secondary | ICD-10-CM

## 2018-08-09 ENCOUNTER — Other Ambulatory Visit: Payer: Self-pay

## 2018-08-09 ENCOUNTER — Telehealth: Payer: Self-pay

## 2018-08-09 ENCOUNTER — Other Ambulatory Visit: Payer: Self-pay | Admitting: Hematology and Oncology

## 2018-08-09 DIAGNOSIS — D0512 Intraductal carcinoma in situ of left breast: Secondary | ICD-10-CM

## 2018-08-09 NOTE — Telephone Encounter (Signed)
RN placed call to patient to obtain clarification and reason needed for ultrasound to left chest wall area.   Pt reports that she has had a mastectomy, and has a raised area that has concerns to her.  Pt reports that she does not have any redness, or open areas to site. RN placed orders for ultrasound, per MD recommendations.  Pt aware and voiced understanding.

## 2018-08-23 ENCOUNTER — Ambulatory Visit
Admission: RE | Admit: 2018-08-23 | Discharge: 2018-08-23 | Disposition: A | Discharge status: Home / Self Care | Payer: Medicare Other | Source: Ambulatory Visit | Attending: Hematology and Oncology | Admitting: Hematology and Oncology

## 2018-08-23 ENCOUNTER — Other Ambulatory Visit: Payer: Self-pay

## 2018-08-23 DIAGNOSIS — C50912 Malignant neoplasm of unspecified site of left female breast: Secondary | ICD-10-CM | POA: Diagnosis not present

## 2018-08-23 DIAGNOSIS — R922 Inconclusive mammogram: Secondary | ICD-10-CM | POA: Diagnosis not present

## 2018-08-23 DIAGNOSIS — D0512 Intraductal carcinoma in situ of left breast: Secondary | ICD-10-CM

## 2018-09-10 LAB — HISTORICAL SURGICAL PATHOLOGY SPECIMEN

## 2018-09-14 LAB — HISTORICAL SURGICAL PATHOLOGY SPECIMEN

## 2018-09-15 LAB — HISTORICAL SURGICAL PATHOLOGY SPECIMEN

## 2018-10-29 DIAGNOSIS — K029 Dental caries, unspecified: Secondary | ICD-10-CM | POA: Diagnosis not present

## 2018-11-02 DIAGNOSIS — K029 Dental caries, unspecified: Secondary | ICD-10-CM | POA: Diagnosis not present

## 2018-11-17 DIAGNOSIS — Z23 Encounter for immunization: Secondary | ICD-10-CM | POA: Diagnosis not present

## 2019-01-22 LAB — HISTORICAL SURGICAL PATHOLOGY SPECIMEN

## 2019-02-19 ENCOUNTER — Telehealth: Payer: Self-pay | Admitting: Hematology and Oncology

## 2019-02-19 NOTE — Telephone Encounter (Signed)
VG Call Day 1/13 moved f/u to AM. Confirmed with patient.

## 2019-03-19 ENCOUNTER — Telehealth: Payer: Self-pay | Admitting: Hematology and Oncology

## 2019-03-19 NOTE — Telephone Encounter (Signed)
Returned patient's phone call regarding rescheduling 01/13 appointment, per patient's request appointment has moved to 02/01.

## 2019-03-20 ENCOUNTER — Ambulatory Visit: Payer: Medicare Other | Admitting: Hematology and Oncology

## 2019-03-20 ENCOUNTER — Inpatient Hospital Stay: Payer: Medicare Other | Admitting: Hematology and Oncology

## 2019-03-20 ENCOUNTER — Telehealth: Payer: Self-pay | Admitting: Hematology and Oncology

## 2019-03-20 NOTE — Telephone Encounter (Signed)
Returned patient's phone call regarding rescheduling 02/01 appointment, per patient's request appointment has moved to 02/05.

## 2019-03-27 ENCOUNTER — Telehealth: Payer: Self-pay | Admitting: Hematology and Oncology

## 2019-03-27 NOTE — Telephone Encounter (Signed)
Returned patient's phone call regarding rescheduling 02/05 appointment, per patient's request appointment has moved to 02/08.

## 2019-04-08 ENCOUNTER — Ambulatory Visit: Payer: Medicare Other | Admitting: Hematology and Oncology

## 2019-04-12 ENCOUNTER — Ambulatory Visit: Payer: Medicare Other | Admitting: Hematology and Oncology

## 2019-04-15 ENCOUNTER — Inpatient Hospital Stay: Payer: Medicare Other | Admitting: Hematology and Oncology

## 2019-04-15 ENCOUNTER — Telehealth: Payer: Self-pay | Admitting: Hematology and Oncology

## 2019-04-15 NOTE — Assessment & Plan Note (Deleted)
Left breast DCIS ER/PR negative high-grade status post lumpectomy July 2014 took adjuvant tamoxifen since September 2014discontinued May 2018.  Recurrence: 07/25/2016: Suspicious 0.9 cm group of calcifications lower inner quadrant left breast biopsy-proven to be ER/PR negative high-grade DCIS  08/22/2016:Left simple mastectomy: DCIS with necrosis and calcifications, grade 3, margins negative, 0/1 lymph node negative, ER 0%, PR 0% stage 0 ----------------------------------------------------------- Recommendation: No role of antiestrogen therapy  Breast cancer surveillance: 1. Mammogram and ultrasound right breast 08/23/2018: No evidence of malignancy.  Left breast mastectomy scar no areas of concern.  Normal fibroglandular tissue at the mastectomy scar. 2.  Breast exam 04/15/2019: Benign  Itching related to breast prosthesis: Mostly relieved Her husband has several health issues and she has been helping him.  He had an amputation of the leg. Left arm lymphedema: Stable  Return to clinic in 1 year for follow-up

## 2019-04-15 NOTE — Telephone Encounter (Signed)
Rescheduled per 2/8 sch msg, pt req. Called and spoke with pt, confirmed 2/26 appt

## 2019-04-28 ENCOUNTER — Ambulatory Visit: Payer: Medicare Other | Attending: Internal Medicine

## 2019-04-28 DIAGNOSIS — Z23 Encounter for immunization: Secondary | ICD-10-CM | POA: Insufficient documentation

## 2019-04-28 NOTE — Progress Notes (Signed)
   Covid-19 Vaccination Clinic  Name:  Jaimy Oguin    MRN: [id number] DOB: 1945-10-14  04/28/2019  Ms. Robe was observed post Covid-19 immunization for 15 minutes without incidence. She was provided with Vaccine Information Sheet and instruction to access the V-Safe system.   Ms. Pilger was instructed to call 911 with any severe reactions post vaccine: Marland Kitchen Difficulty breathing  . Swelling of your face and throat  . A fast heartbeat  . A bad rash all over your body  . Dizziness and weakness    Immunizations Administered    Name Date Dose VIS Date Route   Pfizer COVID-19 Vaccine 04/28/2019  2:12 PM 0.3 mL 02/15/2019 Intramuscular   Manufacturer: [place]   Lot: [id number]   [place]: [id number]

## 2019-05-03 ENCOUNTER — Inpatient Hospital Stay: Payer: Medicare Other | Admitting: Hematology and Oncology

## 2019-05-03 ENCOUNTER — Telehealth: Payer: Self-pay | Admitting: Hematology and Oncology

## 2019-05-03 NOTE — Telephone Encounter (Signed)
Returned patient's phone call regarding rescheduling 02/26 appointment, per patient's daughters request appointment has moved to 03/01.

## 2019-05-03 NOTE — Assessment & Plan Note (Deleted)
Left breast DCIS ER/PR negative high-grade status post lumpectomy July 2014 took adjuvant tamoxifen since September 2014discontinued May 2018.  Recurrence: 07/25/2016: Suspicious 0.9 cm group of calcifications lower inner quadrant left breast biopsy-proven to be ER/PR negative high-grade DCIS  08/22/2016:Left simple mastectomy: DCIS with necrosis and calcifications, grade 3, margins negative, 0/1 lymph node negative, ER 0%, PR 0% stage 0 ----------------------------------------------------------- Recommendation: No role of antiestrogen therapy  Breast cancer surveillance: 1. Mammogram right breast 08/23/2018 with ultrasound: No evidence of malignancy.  Left breast ultrasound for thickening along the mastectomy scar is benign  2.  Breast exam 05/03/2019: Benign  Itching related to breast prosthesis: Mostly relieved Her husband has several health issues and she has been helping him.  He had an amputation of the leg. 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 Return to clinic in 1 year for follow-up

## 2019-05-05 NOTE — Progress Notes (Signed)
Patient Care Team: Velna Hatchet, MD as PCP - General (Internal Medicine)  DIAGNOSIS:    ICD-10-CM   1. Ductal carcinoma in situ (DCIS) of left breast  D05.12     SUMMARY OF ONCOLOGIC HISTORY: Oncology History  Ductal carcinoma in situ (DCIS) of left breast  09/2012 Initial Diagnosis   Mammographically detected left breast abnormality biopsy-proven high-grade  DCIS ER/PR/ HER-2 negative status post lumpectomy (SLN neg) followed by radiation (in [place])   10/2012 -  Anti-estrogen oral therapy   Tamoxifen 20 mg daily started in [place] (patient wishes to take it in spite of her DCIS being ER/PR negative)   07/26/2016 Relapse/Recurrence   Left breast suspicious 0.9 cm group of linear branching calcifications, biopsy revealed DCIS with calcifications ER 0%, PR 0%   08/22/2016 Surgery   Left simple mastectomy: DCIS with necrosis and calcifications, grade 3, margins negative, 0/1 lymph node negative, ER 0%, PR 0% stage 0     CHIEF COMPLIANT: Surveillance of left breast cancer  INTERVAL HISTORY: Colleen Hancock is a 74 y.o. with above-mentioned history of recurrent left breast DCIS who underwent a mastectomy and is currently on surveillance. Mammogram and US of the right breast and left axilla on 08/23/18 showed no evidence of malignancy. She presents to the clinic today for follow-up.   ALLERGIES:  is allergic to fentanyl; tetracyclines & related; tylagesic [acetaminophen]; other; clams [shellfish allergy]; nsaids; and pentazocine lactate.  MEDICATIONS:  Current Outpatient Medications  Medication Sig Dispense Refill  . albuterol (PROVENTIL HFA;VENTOLIN HFA) 108 (90 BASE) MCG/ACT inhaler Inhale 1 puff into the lungs every 6 (six) hours as needed for wheezing or shortness of breath.     Marland Kitchen atorvastatin (LIPITOR) 20 MG tablet Take 20 mg by mouth daily before breakfast.    . cetirizine (ZYRTEC ALLERGY) 10 MG tablet Take 1 tablet (10 mg total) by mouth daily. (Patient  taking differently: Take 10 mg by mouth every evening. )    . cholecalciferol (VITAMIN D) 1000 units tablet Take 1,000 Units by mouth every evening.    . cholestyramine (QUESTRAN) 4 GM/DOSE powder Take 0.75 packets by mouth daily before breakfast. 3/4 scoop (MUST TAKE 1 hour after dicyclomine)    . dicyclomine (BENTYL) 10 MG capsule Take 10 mg by mouth daily at 6 (six) AM. (1st thing in the morning)    . Multiple Vitamins-Minerals (MULTIVITAMIN GUMMIES WOMENS PO) Take 2 tablets by mouth every evening. One-A-Day Women's    . Omega-3 Fatty Acids (FISH OIL PO) Take 720 mg by mouth every evening.    Marland Kitchen oxyCODONE (OXY IR/ROXICODONE) 5 MG immediate release tablet Take 1-2 tablets (5-10 mg total) by mouth every 4 (four) hours as needed for moderate pain. (Patient not taking: Reported on 09/01/2016) 30 tablet 0  . Polyethyl Glycol-Propyl Glycol (SYSTANE) 0.4-0.3 % SOLN Place 1 drop into both eyes daily as needed (for dry/irritated eyes.).    Marland Kitchen traMADol (ULTRAM) 50 MG tablet Take 50 mg by mouth every 6 (six) hours as needed (for pain.).     No current facility-administered medications for this visit.    PHYSICAL EXAMINATION: ECOG PERFORMANCE STATUS: 1 - Symptomatic but completely ambulatory  Vitals:   05/06/19 0928  BP: (!) 170/67  Pulse: 96  Resp: 18  Temp: 98 F (36.7 C)  SpO2: 95%   Filed Weights   05/06/19 0928  Weight: 201 lb 4.8 oz (91.3 kg)    BREAST: No palpable masses or nodules in either right or left  breasts. No palpable axillary supraclavicular or infraclavicular adenopathy no breast tenderness or nipple discharge. (exam performed in the presence of a chaperone)  LABORATORY DATA:  I have reviewed the data as listed CMP Latest Ref Rng & Units 08/19/2016 08/16/2016 04/12/2014  Glucose 65 - 99 mg/dL 122(H) 169(H) 146(H)  BUN 6 - 20 mg/dL '12 17 9  ' Creatinine 0.44 - 1.00 mg/dL 0.97 1.10(H) 1.12(H)  Sodium 135 - 145 mmol/L 134(L) 139 138  Potassium 3.5 - 5.1 mmol/L 4.2 3.8 3.6    Chloride 101 - 111 mmol/L 105 105 109  CO2 22 - 32 mmol/L 20(L) 26 20  Calcium 8.9 - 10.3 mg/dL 8.0(L) 9.2 7.7(L)  Total Protein 6.0 - 8.3 g/dL - - -  Total Bilirubin 0.3 - 1.2 mg/dL - - -  Alkaline Phos 39 - 117 U/L - - -  AST 0 - 37 U/L - - -  ALT 0 - 35 U/L - - -    Lab Results  Component Value Date   WBC 6.2 08/16/2016   HGB 13.1 08/16/2016   HCT 39.4 08/16/2016   MCV 95.6 08/16/2016   PLT 180 08/16/2016   NEUTROABS 6.4 04/11/2014    ASSESSMENT & PLAN:  Ductal carcinoma in situ (DCIS) of left breast Left breast DCIS ER/PR negative high-grade status post lumpectomy July 2014 took adjuvant tamoxifen since September 2014discontinued May 2018.  Recurrence: 07/25/2016: Suspicious 0.9 cm group of calcifications lower inner quadrant left breast biopsy-proven to be ER/PR negative high-grade DCIS  08/22/2016:Left simple mastectomy: DCIS with necrosis and calcifications, grade 3, margins negative, 0/1 lymph node negative, ER 0%, PR 0% stage 0 ----------------------------------------------------------- Recommendation: No role of antiestrogen therapy  Breast cancer surveillance: 1. Mammogram right breast 08/23/2018: No evidence of malignancy.  Ultrasound of the left mastectomy scar: Benign 2.  Breast exam 05/06/2019: Benign We will get ultrasound evaluation of the left chest wall and axilla annually because of swelling and intermittent discomfort that she experiences in that area.  Her husband has several health issues and she has been helping him.  He had an amputation of the leg. 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  Return to clinic in 1 year for follow-up    No orders of the defined types were placed in this encounter.  The patient has a good understanding of the overall plan. she agrees with it. she will call with any problems that may develop before the next visit here.  Total time spent: 20 mins including face to face time and time spent for  planning, charting and coordination of care  Nicholas Lose, MD 05/06/2019  I, Cloyde Reams Dorshimer, am acting as scribe for Dr. Nicholas Lose.  I have reviewed the above documentation for accuracy and completeness, and I agree with the above.

## 2019-05-06 ENCOUNTER — Inpatient Hospital Stay: Payer: Medicare Other | Attending: Hematology and Oncology | Admitting: Hematology and Oncology

## 2019-05-06 ENCOUNTER — Telehealth: Payer: Self-pay | Admitting: Hematology and Oncology

## 2019-05-06 ENCOUNTER — Other Ambulatory Visit: Payer: Self-pay

## 2019-05-06 DIAGNOSIS — Z79810 Long term (current) use of selective estrogen receptor modulators (SERMs): Secondary | ICD-10-CM | POA: Insufficient documentation

## 2019-05-06 DIAGNOSIS — Z79899 Other long term (current) drug therapy: Secondary | ICD-10-CM | POA: Insufficient documentation

## 2019-05-06 DIAGNOSIS — D0512 Intraductal carcinoma in situ of left breast: Secondary | ICD-10-CM | POA: Insufficient documentation

## 2019-05-06 DIAGNOSIS — Z9012 Acquired absence of left breast and nipple: Secondary | ICD-10-CM | POA: Insufficient documentation

## 2019-05-06 DIAGNOSIS — Z171 Estrogen receptor negative status [ER-]: Secondary | ICD-10-CM | POA: Insufficient documentation

## 2019-05-06 NOTE — Assessment & Plan Note (Signed)
Left breast DCIS ER/PR negative high-grade status post lumpectomy July 2014 took adjuvant tamoxifen since September 2014discontinued May 2018.  Recurrence: 07/25/2016: Suspicious 0.9 cm group of calcifications lower inner quadrant left breast biopsy-proven to be ER/PR negative high-grade DCIS  08/22/2016:Left simple mastectomy: DCIS with necrosis and calcifications, grade 3, margins negative, 0/1 lymph node negative, ER 0%, PR 0% stage 0 ----------------------------------------------------------- Recommendation: No role of antiestrogen therapy  Breast cancer surveillance: 1. Mammogram right breast 08/23/2018: No evidence of malignancy.  Ultrasound of the left mastectomy scar: Benign 2.  Breast exam 05/06/2019: Benign  Itching related to breast prosthesis: Mostly relieved Her husband has several health issues and she has been helping him.  He had an amputation of the leg. 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  Return to clinic in 1 year for follow-up

## 2019-05-06 NOTE — Telephone Encounter (Signed)
I left a message regarding schedule  

## 2019-05-22 ENCOUNTER — Ambulatory Visit: Payer: Medicare Other | Attending: Internal Medicine

## 2019-05-22 DIAGNOSIS — Z23 Encounter for immunization: Secondary | ICD-10-CM

## 2019-05-22 NOTE — Progress Notes (Signed)
   Covid-19 Vaccination Clinic  Name:  Colleen Hancock    MRN: [id number] DOB: Sep 16, 1945  05/22/2019  Colleen Hancock was observed post Covid-19 immunization for 15 minutes without incident. She was provided with Vaccine Information Sheet and instruction Hancock access the V-Safe system.   Colleen Hancock was instructed Hancock call 911 with any severe reactions post vaccine: Marland Kitchen Difficulty breathing  . Swelling of face and throat  . A fast heartbeat  . A bad rash all over body  . Dizziness and weakness   Immunizations Administered    Name Date Dose VIS Date Route   Pfizer COVID-19 Vaccine 05/22/2019  1:21 PM 0.3 mL 02/15/2019 Intramuscular   Manufacturer: [place]   Lot: [id number]   [place]: [id number]

## 2019-06-24 DIAGNOSIS — J302 Other seasonal allergic rhinitis: Secondary | ICD-10-CM | POA: Diagnosis not present

## 2019-06-24 DIAGNOSIS — N951 Menopausal and female climacteric states: Secondary | ICD-10-CM | POA: Diagnosis not present

## 2019-06-24 DIAGNOSIS — Z733 Stress, not elsewhere classified: Secondary | ICD-10-CM | POA: Diagnosis not present

## 2019-06-24 DIAGNOSIS — C50919 Malignant neoplasm of unspecified site of unspecified female breast: Secondary | ICD-10-CM | POA: Diagnosis not present

## 2019-06-24 DIAGNOSIS — Z01419 Encounter for gynecological examination (general) (routine) without abnormal findings: Secondary | ICD-10-CM | POA: Diagnosis not present

## 2019-06-24 DIAGNOSIS — K589 Irritable bowel syndrome without diarrhea: Secondary | ICD-10-CM | POA: Diagnosis not present

## 2019-07-01 ENCOUNTER — Other Ambulatory Visit: Payer: Self-pay | Admitting: Hematology and Oncology

## 2019-07-01 DIAGNOSIS — D0512 Intraductal carcinoma in situ of left breast: Secondary | ICD-10-CM

## 2019-07-24 DIAGNOSIS — M859 Disorder of bone density and structure, unspecified: Secondary | ICD-10-CM | POA: Diagnosis not present

## 2019-07-24 DIAGNOSIS — E7849 Other hyperlipidemia: Secondary | ICD-10-CM | POA: Diagnosis not present

## 2019-07-30 DIAGNOSIS — Z0000 Encounter for general adult medical examination without abnormal findings: Secondary | ICD-10-CM | POA: Diagnosis not present

## 2019-07-30 DIAGNOSIS — D0512 Intraductal carcinoma in situ of left breast: Secondary | ICD-10-CM | POA: Diagnosis not present

## 2019-07-30 DIAGNOSIS — M8580 Other specified disorders of bone density and structure, unspecified site: Secondary | ICD-10-CM | POA: Diagnosis not present

## 2019-07-30 DIAGNOSIS — E785 Hyperlipidemia, unspecified: Secondary | ICD-10-CM | POA: Diagnosis not present

## 2019-07-30 DIAGNOSIS — H9201 Otalgia, right ear: Secondary | ICD-10-CM | POA: Diagnosis not present

## 2019-07-30 DIAGNOSIS — J309 Allergic rhinitis, unspecified: Secondary | ICD-10-CM | POA: Diagnosis not present

## 2019-07-30 DIAGNOSIS — R82998 Other abnormal findings in urine: Secondary | ICD-10-CM | POA: Diagnosis not present

## 2019-07-30 DIAGNOSIS — K589 Irritable bowel syndrome without diarrhea: Secondary | ICD-10-CM | POA: Diagnosis not present

## 2019-07-30 DIAGNOSIS — C50919 Malignant neoplasm of unspecified site of unspecified female breast: Secondary | ICD-10-CM | POA: Diagnosis not present

## 2019-07-30 DIAGNOSIS — K259 Gastric ulcer, unspecified as acute or chronic, without hemorrhage or perforation: Secondary | ICD-10-CM | POA: Diagnosis not present

## 2019-07-30 DIAGNOSIS — Z1331 Encounter for screening for depression: Secondary | ICD-10-CM | POA: Diagnosis not present

## 2019-07-30 DIAGNOSIS — R42 Dizziness and giddiness: Secondary | ICD-10-CM | POA: Diagnosis not present

## 2019-07-31 DIAGNOSIS — Z1212 Encounter for screening for malignant neoplasm of rectum: Secondary | ICD-10-CM | POA: Diagnosis not present

## 2019-08-26 ENCOUNTER — Other Ambulatory Visit: Payer: Self-pay | Admitting: Hematology and Oncology

## 2019-08-26 ENCOUNTER — Ambulatory Visit
Admission: RE | Admit: 2019-08-26 | Discharge: 2019-08-26 | Disposition: A | Discharge status: Home / Self Care | Payer: Medicare Other | Source: Ambulatory Visit | Attending: Hematology and Oncology | Admitting: Hematology and Oncology

## 2019-08-26 ENCOUNTER — Other Ambulatory Visit: Payer: Self-pay

## 2019-08-26 DIAGNOSIS — D0512 Intraductal carcinoma in situ of left breast: Secondary | ICD-10-CM

## 2019-09-23 DIAGNOSIS — R142 Eructation: Secondary | ICD-10-CM | POA: Diagnosis not present

## 2019-09-23 DIAGNOSIS — M62838 Other muscle spasm: Secondary | ICD-10-CM | POA: Diagnosis not present

## 2019-09-23 DIAGNOSIS — R0781 Pleurodynia: Secondary | ICD-10-CM | POA: Diagnosis not present

## 2019-09-23 DIAGNOSIS — K589 Irritable bowel syndrome without diarrhea: Secondary | ICD-10-CM | POA: Diagnosis not present

## 2019-11-21 ENCOUNTER — Ambulatory Visit: Payer: Self-pay | Admitting: Podiatry

## 2019-12-28 DIAGNOSIS — Z23 Encounter for immunization: Secondary | ICD-10-CM | POA: Diagnosis not present

## 2020-02-20 ENCOUNTER — Ambulatory Visit (INDEPENDENT_AMBULATORY_CARE_PROVIDER_SITE_OTHER): Payer: Medicare Other | Admitting: Podiatry

## 2020-02-20 ENCOUNTER — Other Ambulatory Visit: Payer: Self-pay

## 2020-02-20 VITALS — BP 130/79 | HR 82 | Temp 98.5°F

## 2020-02-20 DIAGNOSIS — M79675 Pain in left toe(s): Secondary | ICD-10-CM

## 2020-02-20 DIAGNOSIS — G8929 Other chronic pain: Secondary | ICD-10-CM | POA: Diagnosis not present

## 2020-02-20 DIAGNOSIS — L600 Ingrowing nail: Secondary | ICD-10-CM | POA: Diagnosis not present

## 2020-02-20 NOTE — Patient Instructions (Signed)

## 2020-02-22 NOTE — Progress Notes (Signed)
Subjective:   Patient ID: Colleen Hancock, female   DOB: 74 y.o.   MRN: [id number]   HPI 74 year old female presents the office today for concerns of a recurrent ingrown toenail left big toe, medial aspect.  She states that she had the entire nail removed previously but it spicule of nail is growing back and she tried to trim it herself but it keeps coming back causing discomfort.  Denies any redness or drainage or any swelling or any signs of infection.  She has no other concerns today.   Review of Systems  All other systems reviewed and are negative.  Past Medical History:  Diagnosis Date  . Anxiety   . Arthritis    hands, knees  . Asthma    borderine  . Breast CA ([place]) 10/2012   Left Breast  . Complication of anesthesia    during colonoscopy fentanyl dropped blood pressure  . Dyspnea    exertion  . GERD (gastroesophageal reflux disease)   . Heart murmur   . High cholesterol   . IBS (irritable bowel syndrome)     Past Surgical History:  Procedure Laterality Date  . BREAST BIOPSY Left 10/2012   Stereo- Malignant  . BREAST BIOPSY Left    U/S Core- Benign  . BREAST EXCISIONAL BIOPSY Left   . BREAST EXCISIONAL BIOPSY Left   . BREAST LUMPECTOMY Left 11/2012  . CHOLECYSTECTOMY    . COLONOSCOPY    . DILATION AND CURETTAGE OF UTERUS  1967  . MASTECTOMY Left 2018  . MASTECTOMY W/ SENTINEL NODE BIOPSY Left 08/18/2016  . MASTECTOMY W/ SENTINEL NODE BIOPSY Left 08/18/2016   Procedure: LEFT TOTAL MASTECTOMY WITH SENTINEL LYMPH NODE BIOPSY WITH BLUE DYE INJECTION;  Surgeon: Rolm Bookbinder, MD;  Location: [place];  Service: General;  Laterality: Left;  . SKIN CANCER EXCISION Left    eye brow     Current Outpatient Medications:  .  albuterol (PROVENTIL HFA;VENTOLIN HFA) 108 (90 BASE) MCG/ACT inhaler, Inhale 1 puff into the lungs every 6 (six) hours as needed for wheezing or shortness of breath. , Disp: , Rfl:  .  atorvastatin (LIPITOR) 20 MG tablet, Take 20 mg by mouth  daily before breakfast., Disp: , Rfl:  .  cetirizine (ZYRTEC ALLERGY) 10 MG tablet, Take 1 tablet (10 mg total) by mouth daily. (Patient taking differently: Take 10 mg by mouth every evening. ), Disp: , Rfl:  .  cholecalciferol (VITAMIN D) 1000 units tablet, Take 1,000 Units by mouth every evening., Disp: , Rfl:  .  cholestyramine (QUESTRAN) 4 GM/DOSE powder, Take 0.75 packets by mouth daily before breakfast. 3/4 scoop (MUST TAKE 1 hour after dicyclomine), Disp: , Rfl:  .  dicyclomine (BENTYL) 10 MG capsule, Take 10 mg by mouth daily at 6 (six) AM. (1st thing in the morning), Disp: , Rfl:  .  Multiple Vitamins-Minerals (MULTIVITAMIN GUMMIES WOMENS PO), Take 2 tablets by mouth every evening. One-A-Day Women's, Disp: , Rfl:  .  Omega-3 Fatty Acids (FISH OIL PO), Take 720 mg by mouth every evening., Disp: , Rfl:  .  Polyethyl Glycol-Propyl Glycol (SYSTANE) 0.4-0.3 % SOLN, Place 1 drop into both eyes daily as needed (for dry/irritated eyes.)., Disp: , Rfl:  .  traMADol (ULTRAM) 50 MG tablet, Take 50 mg by mouth every 6 (six) hours as needed (for pain.)., Disp: , Rfl:   Allergies  Allergen Reactions  . Fentanyl Other (See Comments)    Low blood pressure.  . Tetracyclines & Related Hives  .  Tylagesic [Acetaminophen] Swelling  . Other     SEAFOOD > UNSPECIFIED REACTION     . Clams [Shellfish Allergy] Diarrhea  . Nsaids Nausea And Vomiting    Stomach upset   . Pentazocine Lactate Nausea And Vomiting        Objective:  Physical Exam  General: AAO x3, NAD  Dermatological: Along the left hallux toenail, medial aspect there is a spicule of toenail that is growing back and causing discomfort.  There is no edema, erythema and signs of infection noted today.  There is no open lesions.  Vascular: Dorsalis Pedis artery and Posterior Tibial artery pedal pulses are 2/4 bilateral with immedate capillary fill time. There is no pain with calf compression, swelling, warmth, erythema.   Neruologic:  Grossly intact via light touch bilateral.   Musculoskeletal: No gross boney pedal deformities bilateral. No pain, crepitus, or limitation noted with foot and ankle range of motion bilateral. Muscular strength 5/5 in all groups tested bilateral.  Gait: Unassisted, Nonantalgic.       Assessment:   Left medial hallux symptomatic ingrown toenail     Plan:  -Treatment options discussed including all alternatives, risks, and complications -Etiology of symptoms were discussed -At this time, the patient is requesting partial nail removal with chemical matricectomy to the symptomatic portion of the nail. Risks and complications were discussed with the patient for which they understand and written consent was obtained. Under sterile conditions a total of 3 mL of a mixture of 2% lidocaine plain and 0.5% Marcaine plain was infiltrated in a hallux block fashion. Once anesthetized, the skin was prepped in sterile fashion. A tourniquet was then applied. Next the medial aspect of hallux nail border was then sharply excised making sure to remove the entire offending nail border. Once the nails were ensured to be removed area was debrided and the underlying skin was intact. There is no purulence identified in the procedure. Next phenol was then applied under standard conditions and copiously irrigated. Silvadene was applied. A dry sterile dressing was applied. After application of the dressing the tourniquet was removed and there is found to be an immediate capillary refill time to the digit. The patient tolerated the procedure well any complications. Post procedure instructions were discussed the patient for which he verbally understood. Follow-up in one week for nail check or sooner if any problems are to arise. Discussed signs/symptoms of infection and directed to call the office immediately should any occur or go directly to the emergency room. In the meantime, encouraged to call the office with any questions,  concerns, changes symptoms.  Trula Slade DPM

## 2020-03-03 DIAGNOSIS — H00014 Hordeolum externum left upper eyelid: Secondary | ICD-10-CM | POA: Diagnosis not present

## 2020-03-10 ENCOUNTER — Ambulatory Visit: Payer: Medicare Other | Admitting: Podiatry

## 2020-03-26 ENCOUNTER — Ambulatory Visit: Payer: Medicare Other | Admitting: Podiatry

## 2020-04-09 ENCOUNTER — Encounter: Payer: Self-pay | Admitting: Podiatry

## 2020-04-09 ENCOUNTER — Other Ambulatory Visit: Payer: Self-pay

## 2020-04-09 ENCOUNTER — Ambulatory Visit (INDEPENDENT_AMBULATORY_CARE_PROVIDER_SITE_OTHER): Payer: Medicare Other | Admitting: Podiatry

## 2020-04-09 DIAGNOSIS — L600 Ingrowing nail: Secondary | ICD-10-CM | POA: Diagnosis not present

## 2020-04-12 DIAGNOSIS — L600 Ingrowing nail: Secondary | ICD-10-CM | POA: Insufficient documentation

## 2020-04-12 NOTE — Progress Notes (Signed)
Subjective: Colleen Hancock is a 75 y.o.  Female returns to office today for follow up evaluation after having left hallux partial nail avulsion performed.  Since the area is healed well and not causing any issues.  She is stop soaking the area is healed.  Denies any drainage or pus or any pain at this time.  No swelling or redness. Patient denies fevers, chills, nausea, vomiting. Denies any calf pain, chest pain, SOB.   Objective:  Vitals: Reviewed  General: Well developed, nourished, in no acute distress, alert and oriented x3   Dermatology: Skin is warm, dry and supple bilateral. LEFT  hallux nail border appears to be healed at this time.  There is 1 piece of loose callus which I debrided and underlying skin intact.  There is no open lesions.  No edema, erythema or signs of infection.  There are no other lesions or other signs of infection present.  Neurovascular status: Intact. No lower extremity swelling; No pain with calf compression bilateral.  Musculoskeletal: No tenderness to palpation of the left hallux nail fold. Muscular strength within normal limits bilateral.   Assesement and Plan: S/p partial nail avulsion, doing well.   -Procedure site is healed.  Monitor for any reoccurrence.  Document signs or symptoms of infection.    Trula Slade DPM

## 2020-04-21 ENCOUNTER — Encounter (INDEPENDENT_AMBULATORY_CARE_PROVIDER_SITE_OTHER): Payer: Self-pay

## 2020-05-04 NOTE — Progress Notes (Signed)
Patient Care Team: Velna Hatchet, MD as PCP - General (Internal Medicine)  DIAGNOSIS:    ICD-10-CM   1. Ductal carcinoma in situ (DCIS) of left breast  D05.12     SUMMARY OF ONCOLOGIC HISTORY: Oncology History  Ductal carcinoma in situ (DCIS) of left breast  09/2012 Initial Diagnosis   Mammographically detected left breast abnormality biopsy-proven high-grade  DCIS ER/PR/ HER-2 negative status post lumpectomy (SLN neg) followed by radiation (in [place])   10/2012 -  Anti-estrogen oral therapy   Tamoxifen 20 mg daily started in [place] (patient wishes to take it in spite of her DCIS being ER/PR negative)   07/26/2016 Relapse/Recurrence   Left breast suspicious 0.9 cm group of linear branching calcifications, biopsy revealed DCIS with calcifications ER 0%, PR 0%   08/22/2016 Surgery   Left simple mastectomy: DCIS with necrosis and calcifications, grade 3, margins negative, 0/1 lymph node negative, ER 0%, PR 0% stage 0     CHIEF COMPLIANT: Surveillance of left breast cancer  INTERVAL HISTORY: Colleen Hancock is a 75 y.o. with above-mentioned history of recurrent left breast DCIS who underwent a mastectomy and is currently on surveillance. Mammogram and US of the right breast and left axilla on 08/26/19 showed no evidence of malignancy. She presents to the clinic today for follow-up.    ALLERGIES:  is allergic to fentanyl, tetracyclines & related, tylagesic [acetaminophen], other, clams [shellfish allergy], nsaids, and pentazocine lactate.  MEDICATIONS:  Current Outpatient Medications  Medication Sig Dispense Refill  . albuterol (PROVENTIL HFA;VENTOLIN HFA) 108 (90 BASE) MCG/ACT inhaler Inhale 1 puff into the lungs every 6 (six) hours as needed for wheezing or shortness of breath.     Marland Kitchen atorvastatin (LIPITOR) 20 MG tablet Take 20 mg by mouth daily before breakfast.    . cetirizine (ZYRTEC ALLERGY) 10 MG tablet Take 1 tablet (10 mg total) by mouth daily. (Patient  taking differently: Take 10 mg by mouth every evening. )    . cholecalciferol (VITAMIN D) 1000 units tablet Take 1,000 Units by mouth every evening.    . cholestyramine (QUESTRAN) 4 GM/DOSE powder Take 0.75 packets by mouth daily before breakfast. 3/4 scoop (MUST TAKE 1 hour after dicyclomine)    . dicyclomine (BENTYL) 10 MG capsule Take 10 mg by mouth daily at 6 (six) AM. (1st thing in the morning)    . Multiple Vitamins-Minerals (MULTIVITAMIN GUMMIES WOMENS PO) Take 2 tablets by mouth every evening. One-A-Day Women's    . Omega-3 Fatty Acids (FISH OIL PO) Take 720 mg by mouth every evening.    Vladimir Faster Glycol-Propyl Glycol (SYSTANE) 0.4-0.3 % SOLN Place 1 drop into both eyes daily as needed (for dry/irritated eyes.).    Marland Kitchen traMADol (ULTRAM) 50 MG tablet Take 50 mg by mouth every 6 (six) hours as needed (for pain.).     No current facility-administered medications for this visit.    PHYSICAL EXAMINATION: ECOG PERFORMANCE STATUS: 1 - Symptomatic but completely ambulatory  There were no vitals filed for this visit. There were no vitals filed for this visit.  BREAST: No palpable masses or nodules in either right or left breasts. No palpable axillary supraclavicular or infraclavicular adenopathy no breast tenderness or nipple discharge. (exam performed in the presence of a chaperone)  LABORATORY DATA:  I have reviewed the data as listed CMP Latest Ref Rng & Units 08/19/2016 08/16/2016 04/12/2014  Glucose 65 - 99 mg/dL 122(H) 169(H) 146(H)  BUN 6 - 20 mg/dL 12 17 9  Creatinine 0.44 - 1.00 mg/dL 0.97 1.10(H) 1.12(H)  Sodium 135 - 145 mmol/L 134(L) 139 138  Potassium 3.5 - 5.1 mmol/L 4.2 3.8 3.6  Chloride 101 - 111 mmol/L 105 105 109  CO2 22 - 32 mmol/L 20(L) 26 20  Calcium 8.9 - 10.3 mg/dL 8.0(L) 9.2 7.7(L)  Total Protein 6.0 - 8.3 g/dL - - -  Total Bilirubin 0.3 - 1.2 mg/dL - - -  Alkaline Phos 39 - 117 U/L - - -  AST 0 - 37 U/L - - -  ALT 0 - 35 U/L - - -    Lab Results  Component  Value Date   WBC 6.2 08/16/2016   HGB 13.1 08/16/2016   HCT 39.4 08/16/2016   MCV 95.6 08/16/2016   PLT 180 08/16/2016   NEUTROABS 6.4 04/11/2014    ASSESSMENT & PLAN:  Ductal carcinoma in situ (DCIS) of left breast Left breast DCIS ER/PR negative high-grade status post lumpectomy July 2014 took adjuvant tamoxifen since September 2014discontinued May 2018.  Recurrence: 07/25/2016: Suspicious 0.9 cm group of calcifications lower inner quadrant left breast biopsy-proven to be ER/PR negative high-grade DCIS  08/22/2016:Left simple mastectomy: DCIS with necrosis and calcifications, grade 3, margins negative, 0/1 lymph node negative, ER 0%, PR 0% stage 0 ----------------------------------------------------------- Recommendation: No role of antiestrogen therapy  Breast cancer surveillance: 1. Mammogram and U/S right breast 08/06/2019: No evidence of malignancy. 2.Breast exam 05/05/2020: Benign   Her husband has several health issues and she has been helping him. He had an amputation of the leg. 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  Return to clinic in 1 year for follow-up    No orders of the defined types were placed in this encounter.  The patient has a good understanding of the overall plan. she agrees with it. she will call with any problems that may develop before the next visit here.  Total time spent: 20 mins including face to face time and time spent for planning, charting and coordination of care  Rulon Eisenmenger, MD, MPH 05/05/2020  I, Cloyde Reams Dorshimer, am acting as scribe for Dr. Nicholas Lose.  I have reviewed the above documentation for accuracy and completeness, and I agree with the above.

## 2020-05-04 NOTE — Assessment & Plan Note (Signed)
Left breast DCIS ER/PR negative high-grade status post lumpectomy July 2014 took adjuvant tamoxifen since September 2014discontinued May 2018.  Recurrence: 07/25/2016: Suspicious 0.9 cm group of calcifications lower inner quadrant left breast biopsy-proven to be ER/PR negative high-grade DCIS  08/22/2016:Left simple mastectomy: DCIS with necrosis and calcifications, grade 3, margins negative, 0/1 lymph node negative, ER 0%, PR 0% stage 0 ----------------------------------------------------------- Recommendation: No role of antiestrogen therapy  Breast cancer surveillance: 1. Mammogram and U/S right breast 08/06/2019: No evidence of malignancy. 2.Breast exam 05/05/2020: Benign We will get ultrasound evaluation of the left chest wall and axilla annually because of swelling and intermittent discomfort that she experiences in that area.  Her husband has several health issues and she has been helping him. He had an amputation of the leg. 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  Return to clinic in 1 year for follow-up

## 2020-05-05 ENCOUNTER — Telehealth: Payer: Self-pay | Admitting: Hematology and Oncology

## 2020-05-05 ENCOUNTER — Other Ambulatory Visit: Payer: Self-pay

## 2020-05-05 ENCOUNTER — Ambulatory Visit: Payer: Medicare Other | Admitting: Hematology and Oncology

## 2020-05-05 ENCOUNTER — Inpatient Hospital Stay: Payer: Medicare Other | Attending: Hematology and Oncology | Admitting: Hematology and Oncology

## 2020-05-05 DIAGNOSIS — D0512 Intraductal carcinoma in situ of left breast: Secondary | ICD-10-CM | POA: Diagnosis not present

## 2020-05-05 DIAGNOSIS — Z170 Estrogen receptor positive status [ER+]: Secondary | ICD-10-CM | POA: Insufficient documentation

## 2020-05-05 DIAGNOSIS — Z79899 Other long term (current) drug therapy: Secondary | ICD-10-CM | POA: Diagnosis not present

## 2020-05-05 DIAGNOSIS — Z9012 Acquired absence of left breast and nipple: Secondary | ICD-10-CM | POA: Insufficient documentation

## 2020-05-05 NOTE — Telephone Encounter (Signed)
Scheduled appts per 3/1 los. Gave pt a print out of AVS.

## 2020-06-08 DIAGNOSIS — D0512 Intraductal carcinoma in situ of left breast: Secondary | ICD-10-CM | POA: Diagnosis not present

## 2020-06-10 DIAGNOSIS — R109 Unspecified abdominal pain: Secondary | ICD-10-CM | POA: Diagnosis not present

## 2020-06-10 DIAGNOSIS — R1032 Left lower quadrant pain: Secondary | ICD-10-CM | POA: Diagnosis not present

## 2020-06-10 DIAGNOSIS — K580 Irritable bowel syndrome with diarrhea: Secondary | ICD-10-CM | POA: Diagnosis not present

## 2020-06-18 DIAGNOSIS — H0014 Chalazion left upper eyelid: Secondary | ICD-10-CM | POA: Diagnosis not present

## 2020-07-15 ENCOUNTER — Encounter: Payer: Self-pay | Admitting: Gastroenterology

## 2020-07-28 DIAGNOSIS — R102 Pelvic and perineal pain: Secondary | ICD-10-CM | POA: Diagnosis not present

## 2020-07-28 DIAGNOSIS — C50919 Malignant neoplasm of unspecified site of unspecified female breast: Secondary | ICD-10-CM | POA: Diagnosis not present

## 2020-07-28 DIAGNOSIS — Z6835 Body mass index (BMI) 35.0-35.9, adult: Secondary | ICD-10-CM | POA: Diagnosis not present

## 2020-07-28 DIAGNOSIS — Z124 Encounter for screening for malignant neoplasm of cervix: Secondary | ICD-10-CM | POA: Diagnosis not present

## 2020-08-05 DIAGNOSIS — M859 Disorder of bone density and structure, unspecified: Secondary | ICD-10-CM | POA: Diagnosis not present

## 2020-08-05 DIAGNOSIS — K259 Gastric ulcer, unspecified as acute or chronic, without hemorrhage or perforation: Secondary | ICD-10-CM | POA: Diagnosis not present

## 2020-08-05 DIAGNOSIS — I10 Essential (primary) hypertension: Secondary | ICD-10-CM | POA: Diagnosis not present

## 2020-08-05 DIAGNOSIS — Z0000 Encounter for general adult medical examination without abnormal findings: Secondary | ICD-10-CM | POA: Diagnosis not present

## 2020-08-05 DIAGNOSIS — Z1331 Encounter for screening for depression: Secondary | ICD-10-CM | POA: Diagnosis not present

## 2020-08-05 DIAGNOSIS — Z1339 Encounter for screening examination for other mental health and behavioral disorders: Secondary | ICD-10-CM | POA: Diagnosis not present

## 2020-08-05 DIAGNOSIS — Z23 Encounter for immunization: Secondary | ICD-10-CM | POA: Diagnosis not present

## 2020-08-05 DIAGNOSIS — K589 Irritable bowel syndrome without diarrhea: Secondary | ICD-10-CM | POA: Diagnosis not present

## 2020-08-05 DIAGNOSIS — N939 Abnormal uterine and vaginal bleeding, unspecified: Secondary | ICD-10-CM | POA: Diagnosis not present

## 2020-08-05 DIAGNOSIS — E785 Hyperlipidemia, unspecified: Secondary | ICD-10-CM | POA: Diagnosis not present

## 2020-08-07 DIAGNOSIS — R82998 Other abnormal findings in urine: Secondary | ICD-10-CM | POA: Diagnosis not present

## 2020-08-07 DIAGNOSIS — K921 Melena: Secondary | ICD-10-CM | POA: Diagnosis not present

## 2020-08-31 ENCOUNTER — Ambulatory Visit
Admission: RE | Admit: 2020-08-31 | Discharge: 2020-08-31 | Disposition: A | Discharge status: Home / Self Care | Payer: Medicare Other | Source: Ambulatory Visit | Attending: Hematology and Oncology | Admitting: Hematology and Oncology

## 2020-08-31 ENCOUNTER — Other Ambulatory Visit: Payer: Self-pay

## 2020-08-31 DIAGNOSIS — D0512 Intraductal carcinoma in situ of left breast: Secondary | ICD-10-CM

## 2020-08-31 DIAGNOSIS — R928 Other abnormal and inconclusive findings on diagnostic imaging of breast: Secondary | ICD-10-CM | POA: Diagnosis not present

## 2020-08-31 DIAGNOSIS — N6489 Other specified disorders of breast: Secondary | ICD-10-CM | POA: Diagnosis not present

## 2020-08-31 DIAGNOSIS — Z853 Personal history of malignant neoplasm of breast: Secondary | ICD-10-CM | POA: Diagnosis not present

## 2020-09-02 ENCOUNTER — Ambulatory Visit: Payer: Medicare Other | Admitting: Gastroenterology

## 2020-09-14 ENCOUNTER — Other Ambulatory Visit: Payer: Self-pay

## 2020-09-14 ENCOUNTER — Encounter (HOSPITAL_BASED_OUTPATIENT_CLINIC_OR_DEPARTMENT_OTHER): Payer: Self-pay | Admitting: Obstetrics and Gynecology

## 2020-09-14 DIAGNOSIS — E7800 Pure hypercholesterolemia, unspecified: Secondary | ICD-10-CM

## 2020-09-14 DIAGNOSIS — F419 Anxiety disorder, unspecified: Secondary | ICD-10-CM

## 2020-09-14 DIAGNOSIS — R0600 Dyspnea, unspecified: Secondary | ICD-10-CM

## 2020-09-14 DIAGNOSIS — K760 Fatty (change of) liver, not elsewhere classified: Secondary | ICD-10-CM | POA: Insufficient documentation

## 2020-09-14 DIAGNOSIS — Z01818 Encounter for other preprocedural examination: Secondary | ICD-10-CM | POA: Diagnosis not present

## 2020-09-14 DIAGNOSIS — R9389 Abnormal findings on diagnostic imaging of other specified body structures: Secondary | ICD-10-CM

## 2020-09-14 DIAGNOSIS — M25412 Effusion, left shoulder: Secondary | ICD-10-CM

## 2020-09-14 DIAGNOSIS — R011 Cardiac murmur, unspecified: Secondary | ICD-10-CM

## 2020-09-14 DIAGNOSIS — Z973 Presence of spectacles and contact lenses: Secondary | ICD-10-CM

## 2020-09-14 DIAGNOSIS — K589 Irritable bowel syndrome without diarrhea: Secondary | ICD-10-CM

## 2020-09-14 DIAGNOSIS — K5792 Diverticulitis of intestine, part unspecified, without perforation or abscess without bleeding: Secondary | ICD-10-CM

## 2020-09-14 DIAGNOSIS — Z789 Other specified health status: Secondary | ICD-10-CM | POA: Diagnosis not present

## 2020-09-14 DIAGNOSIS — J45909 Unspecified asthma, uncomplicated: Secondary | ICD-10-CM

## 2020-09-14 DIAGNOSIS — I10 Essential (primary) hypertension: Secondary | ICD-10-CM | POA: Insufficient documentation

## 2020-09-14 DIAGNOSIS — Z01812 Encounter for preprocedural laboratory examination: Secondary | ICD-10-CM | POA: Diagnosis not present

## 2020-09-14 HISTORY — DX: Irritable bowel syndrome, unspecified: K58.9

## 2020-09-14 HISTORY — DX: Dyspnea, unspecified: R06.00

## 2020-09-14 HISTORY — DX: Essential (primary) hypertension: I10

## 2020-09-14 HISTORY — DX: Diverticulitis of intestine, part unspecified, without perforation or abscess without bleeding: K57.92

## 2020-09-14 HISTORY — DX: Cardiac murmur, unspecified: R01.1

## 2020-09-14 HISTORY — DX: Pure hypercholesterolemia, unspecified: E78.00

## 2020-09-14 HISTORY — DX: Unspecified asthma, uncomplicated: J45.909

## 2020-09-14 HISTORY — DX: Anxiety disorder, unspecified: F41.9

## 2020-09-14 HISTORY — DX: Abnormal findings on diagnostic imaging of other specified body structures: R93.89

## 2020-09-14 HISTORY — DX: Presence of spectacles and contact lenses: Z97.3

## 2020-09-14 HISTORY — DX: Effusion, left shoulder: M25.412

## 2020-09-14 HISTORY — DX: Fatty (change of) liver, not elsewhere classified: K76.0

## 2020-09-14 NOTE — Progress Notes (Signed)
Spoke w/ via phone for pre-op interview---pt  Lab needs dos---- none Lab results------has lab appt 09-15-2020 1400 for cbc cmp and ekg COVID test -----patient states asymptomatic no test needed Arrive at -------530 am 09-18-2020  NPO after MN NO Solid Food.  Clear liquids from MN until---430 am then npo Med rec completed Medications to take morning of surgery -----albuterol inhaler prn and bring inhaler, atorvastatin, fluticasone nasal spray prn Diabetic medication -----n./a Patient instructed no nail polish to be worn day of surgery Patient instructed to bring photo id and insurance card day of surgery Patient aware to have Driver (ride ) / caregiver   daughter Nunzio Cory cell [phone]  for 24 hours after surgery  Patient Special Instructions -----none Pre-Op special Istructions -----none Patient verbalized understanding of instructions that were given at this phone interview. Patient denies shortness of breath, chest pain, fever, cough at this phone interview.

## 2020-09-15 ENCOUNTER — Encounter (HOSPITAL_COMMUNITY)
Admission: RE | Admit: 2020-09-15 | Discharge: 2020-09-15 | Disposition: A | Discharge status: Home / Self Care | Payer: Medicare Other | Source: Ambulatory Visit | Attending: Surgery | Admitting: Surgery

## 2020-09-15 DIAGNOSIS — Z01812 Encounter for preprocedural laboratory examination: Secondary | ICD-10-CM | POA: Diagnosis not present

## 2020-09-15 LAB — CBC
HCT: 39.4 % (ref 36.0–46.0)
Hemoglobin: 13 g/dL (ref 12.0–15.0)
MCH: 32.3 pg (ref 26.0–34.0)
MCHC: 33 g/dL (ref 30.0–36.0)
MCV: 98 fL (ref 80.0–100.0)
Platelets: 177 10*3/uL (ref 150–400)
RBC: 4.02 MIL/uL (ref 3.87–5.11)
RDW: 12.6 % (ref 11.5–15.5)
WBC: 5.8 10*3/uL (ref 4.0–10.5)
nRBC: 0 % (ref 0.0–0.2)

## 2020-09-15 LAB — COMPREHENSIVE METABOLIC PANEL
ALT: 31 U/L (ref 0–44)
AST: 31 U/L (ref 15–41)
Albumin: 4.4 g/dL (ref 3.5–5.0)
Alkaline Phosphatase: 71 U/L (ref 38–126)
Anion gap: 9 (ref 5–15)
BUN: 21 mg/dL (ref 8–23)
CO2: 27 mmol/L (ref 22–32)
Calcium: 9.6 mg/dL (ref 8.9–10.3)
Chloride: 105 mmol/L (ref 98–111)
Creatinine, Ser: 1.01 mg/dL — ABNORMAL HIGH (ref 0.44–1.00)
GFR, Estimated: 58 mL/min — ABNORMAL LOW (ref >60)
Glucose, Bld: 108 mg/dL — ABNORMAL HIGH (ref 70–99)
Potassium: 4.1 mmol/L (ref 3.5–5.1)
Sodium: 141 mmol/L (ref 135–145)
Total Bilirubin: 0.9 mg/dL (ref 0.3–1.2)
Total Protein: 7.5 g/dL (ref 6.5–8.1)

## 2020-09-16 NOTE — H&P (Signed)
Colleen Hancock is an 74 y.o. female [phone] with thickened cystic endometrium and history of tamoxifen use.  Pt has not had vaginal bleeding,  but 2cm lining visualized on TVUS.  D/W pt r/b/a of hysteroscopy, D&C and poss myosure.  Also d/w pt process and expectations.    Pertinent Gynecological History: [id number] G1 LTCS, G2 SVD Remote abn pap, last WNL No STDs H/o DCIS and taking tamoxifen for several years  Menstrual History:  No LMP recorded. Patient is postmenopausal.    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   Breast CA ([place]) 10/2012   Left Breast   Complication of anesthesia    during colonoscopy fentanyl dropped blood pressure 2013 or 2014   Diverticulitis 09/14/2020   no meds currently   Dyspnea 09/14/2020   on exertion can climb flight of steps   Fatty liver 09/14/2020   Heart murmur 09/14/2020   mild no cardiologist   High cholesterol 09/14/2020   History of migraine    none since 1990's   Hypertension 09/14/2020   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   with diarrhea   Pneumonia    yrs ago per pt on 09-14-2020   Swelling of joint of left shoulder 09/14/2020   swelling under left shoulder, 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   Thickened endometrium 09/14/2020   Wears glasses 09/14/2020    Past Surgical History:  Procedure Laterality Date   BREAST BIOPSY Left 10/2012   Stereo- Malignant   BREAST BIOPSY Left    U/S Core- Benign   BREAST EXCISIONAL BIOPSY Left    BREAST EXCISIONAL BIOPSY Left    BREAST LUMPECTOMY Left 11/2012   radiation tx done x 34 treatments   [place]  2010   laparoscopic   COLONOSCOPY     2013 or 2014   [place] Left 2018   MASTECTOMY W/ SENTINEL NODE BIOPSY Left 08/18/2016   MASTECTOMY W/ SENTINEL NODE BIOPSY Left 08/18/2016    Procedure: LEFT TOTAL MASTECTOMY WITH SENTINEL LYMPH NODE BIOPSY WITH BLUE DYE INJECTION;  Surgeon: Rolm Bookbinder, MD;  Location: [place] [facility];  Service: General;  Laterality: Left;   SKIN CANCER EXCISION Left 2009   eye brow    Family History  Problem Relation Age of Onset   Heart disease Other    Breast cancer Maternal Aunt    Breast cancer Cousin    Breast cancer Cousin    Breast cancer Cousin     Social History:  reports that she has never smoked. She has never used smokeless tobacco. She reports current alcohol use. She reports that she does not use drugs. Married, retired and caretaker of husband  Allergies:  Allergies  Allergen Reactions   Fentanyl Other (See Comments)    Low blood pressure.   Tetracyclines & Related Hives   Tylagesic [Acetaminophen] Swelling    Does not help with pain   Other     SEAFOOD > UNSPECIFIED REACTION      Clams [Shellfish Allergy] Diarrhea   Nsaids Nausea And Vomiting    Stomach upset /had hx of stomach ulcers   Pentazocine Lactate Nausea And Vomiting    [place] n/v tunnel vision    Meds: atorvastatin, caltrate, centrum, cholestyramine, dicyclomine, fluticasone nasal spray, lisinopril, zyrtec, omega 3  Review of Systems  Constitutional: Negative.   HENT: Negative.    Respiratory: Negative.    Cardiovascular: Negative.   Gastrointestinal: Negative.   Genitourinary:  Positive for pelvic pain.  Musculoskeletal: Negative.   Neurological: Negative.   Hematological: Negative.   Psychiatric/Behavioral: Negative.     Height 5' 3.75" (1.619 m), weight 92.1 kg. Physical Exam Constitutional:      Appearance: Normal appearance.  HENT:     Head: Normocephalic and atraumatic.  Cardiovascular:     Rate and Rhythm: Normal rate and regular rhythm.  Pulmonary:     Effort: Pulmonary effort is normal.     Breath sounds: Normal breath sounds.  Abdominal:     General: Bowel sounds are normal.     Palpations: Abdomen is soft.  Genitourinary:     General: Normal vulva.     Rectum: Normal.  Musculoskeletal:        General: Normal range of motion.     Cervical back: Normal range of motion and neck supple.  Skin:    General: Skin is warm and dry.  Neurological:     General: No focal deficit present.     Mental Status: She is alert and oriented to person, place, and time.  Psychiatric:        Mood and Affect: Mood normal.        Behavior: Behavior normal.    Results for orders placed or performed during the hospital encounter of 09/18/20 (from the past 24 hour(s))  CBC     Status: None   Collection Time: 09/15/20  1:55 PM  Result Value Ref Range   WBC 5.8 4.0 - 10.5 K/uL   RBC 4.02 3.87 - 5.11 MIL/uL   Hemoglobin 13.0 12.0 - 15.0 g/dL   HCT 39.4 36.0 - 46.0 %   MCV 98.0 80.0 - 100.0 fL   MCH 32.3 26.0 - 34.0 pg   MCHC 33.0 30.0 - 36.0 g/dL   RDW 12.6 11.5 - 15.5 %   Platelets 177 150 - 400 K/uL   nRBC 0.0 0.0 - 0.2 %  Comprehensive metabolic panel     Status: Abnormal   Collection Time: 09/15/20  1:55 PM  Result Value Ref Range   Sodium 141 135 - 145 mmol/L   Potassium 4.1 3.5 - 5.1 mmol/L   Chloride 105 98 - 111 mmol/L   CO2 27 22 - 32 mmol/L   Glucose, Bld 108 (H) 70 - 99 mg/dL   BUN 21 8 - 23 mg/dL   Creatinine, Ser 1.01 (H) 0.44 - 1.00 mg/dL   Calcium 9.6 8.9 - 10.3 mg/dL   Total Protein 7.5 6.5 - 8.1 g/dL   Albumin 4.4 3.5 - 5.0 g/dL   AST 31 15 - 41 U/L   ALT 31 0 - 44 U/L   Alkaline Phosphatase 71 38 - 126 U/L   Total Bilirubin 0.9 0.3 - 1.2 mg/dL   GFR, Estimated 58 (L) >60 mL/min   Anion gap 9 5 - 15    [place] nl sized uterus - thickened cystic endometrial stripe, avascular; ovaries not visualized  Assessment/Plan: 74yo [id number] w thickened endometrial stripe for hysteroscopy, D&C poss myosure (history of tamoxifen use) D/w pt r/b/a and process/expectations of procedure  Colleen Hancock 09/16/2020, 10:29 AM

## 2020-09-17 NOTE — Anesthesia Preprocedure Evaluation (Addendum)
Anesthesia Evaluation  Patient identified by MRN, date of birth, ID band Patient awake    Reviewed: Allergy & Precautions, NPO status , Patient's Chart, lab work & pertinent test results  History of Anesthesia Complications Negative for: history of anesthetic complications  Airway Mallampati: II  TM Distance: >3 FB Neck ROM: Full    Dental  (+) Missing, Poor Dentition,    Pulmonary asthma ,    Pulmonary exam normal        Cardiovascular hypertension, Pt. on medications Normal cardiovascular exam     Neuro/Psych Anxiety negative neurological ROS     GI/Hepatic negative GI ROS, Neg liver ROS,   Endo/Other  BMI 35  Renal/GU negative Renal ROS  negative genitourinary   Musculoskeletal  (+) Arthritis ,   Abdominal   Peds  Hematology negative hematology ROS (+)   Anesthesia Other Findings Day of surgery medications reviewed with patient.  Reproductive/Obstetrics negative OB ROS                            Anesthesia Physical Anesthesia Plan  ASA: 2  Anesthesia Plan: General   Post-op Pain Management:    Induction: Intravenous  PONV Risk Score and Plan: Treatment may vary due to age or medical condition, Ondansetron and Dexamethasone  Airway Management Planned: LMA  Additional Equipment: None  Intra-op Plan:   Post-operative Plan: Extubation in OR  Informed Consent: I have reviewed the patients History and Physical, chart, labs and discussed the procedure including the risks, benefits and alternatives for the proposed anesthesia with the patient or authorized representative who has indicated his/her understanding and acceptance.     Dental advisory given  Plan Discussed with: CRNA  Anesthesia Plan Comments:        Anesthesia Quick Evaluation

## 2020-09-18 ENCOUNTER — Ambulatory Visit (HOSPITAL_BASED_OUTPATIENT_CLINIC_OR_DEPARTMENT_OTHER): Payer: Medicare Other | Admitting: Anesthesiology

## 2020-09-18 ENCOUNTER — Encounter (HOSPITAL_BASED_OUTPATIENT_CLINIC_OR_DEPARTMENT_OTHER)
Admission: RE | Disposition: A | Discharge status: Home / Self Care | Payer: Self-pay | Source: Ambulatory Visit | Attending: Obstetrics and Gynecology

## 2020-09-18 ENCOUNTER — Encounter (HOSPITAL_BASED_OUTPATIENT_CLINIC_OR_DEPARTMENT_OTHER): Payer: Self-pay | Admitting: Obstetrics and Gynecology

## 2020-09-18 ENCOUNTER — Ambulatory Visit (HOSPITAL_BASED_OUTPATIENT_CLINIC_OR_DEPARTMENT_OTHER)
Admission: RE | Admit: 2020-09-18 | Discharge: 2020-09-18 | Disposition: A | Discharge status: Home / Self Care | Payer: Medicare Other | Source: Ambulatory Visit | Attending: Obstetrics and Gynecology | Admitting: Obstetrics and Gynecology

## 2020-09-18 DIAGNOSIS — Z853 Personal history of malignant neoplasm of breast: Secondary | ICD-10-CM | POA: Insufficient documentation

## 2020-09-18 DIAGNOSIS — Z881 Allergy status to other antibiotic agents status: Secondary | ICD-10-CM | POA: Diagnosis not present

## 2020-09-18 DIAGNOSIS — D0512 Intraductal carcinoma in situ of left breast: Secondary | ICD-10-CM | POA: Diagnosis not present

## 2020-09-18 DIAGNOSIS — Z79899 Other long term (current) drug therapy: Secondary | ICD-10-CM | POA: Insufficient documentation

## 2020-09-18 DIAGNOSIS — N859 Noninflammatory disorder of uterus, unspecified: Secondary | ICD-10-CM | POA: Diagnosis not present

## 2020-09-18 DIAGNOSIS — Z885 Allergy status to narcotic agent status: Secondary | ICD-10-CM | POA: Insufficient documentation

## 2020-09-18 DIAGNOSIS — I10 Essential (primary) hypertension: Secondary | ICD-10-CM | POA: Diagnosis not present

## 2020-09-18 DIAGNOSIS — Z886 Allergy status to analgesic agent status: Secondary | ICD-10-CM | POA: Diagnosis not present

## 2020-09-18 DIAGNOSIS — N840 Polyp of corpus uteri: Secondary | ICD-10-CM | POA: Diagnosis present

## 2020-09-18 DIAGNOSIS — E7800 Pure hypercholesterolemia, unspecified: Secondary | ICD-10-CM | POA: Diagnosis not present

## 2020-09-18 DIAGNOSIS — R9389 Abnormal findings on diagnostic imaging of other specified body structures: Secondary | ICD-10-CM | POA: Diagnosis present

## 2020-09-18 HISTORY — DX: Pneumonia, unspecified organism: J18.9

## 2020-09-18 HISTORY — PX: HYSTEROSCOPY WITH D & C: SHX1775

## 2020-09-18 HISTORY — DX: Personal history of other diseases of the nervous system and sense organs: Z86.69

## 2020-09-18 SURGERY — DILATATION AND CURETTAGE /HYSTEROSCOPY
Anesthesia: General | Site: Vagina

## 2020-09-18 MED ORDER — TRAMADOL HCL 50 MG PO TABS: 50.0000 mg | ORAL_TABLET | Freq: Four times a day (QID) | ORAL | 0 refills | Status: DC | PRN

## 2020-09-18 MED ORDER — LIDOCAINE HCL (PF) 2 % IJ SOLN
INTRAMUSCULAR | Status: AC
  Filled 2020-09-18: qty 5

## 2020-09-18 MED ORDER — PROPOFOL 10 MG/ML IV BOLUS
INTRAVENOUS | Status: DC | PRN
  Administered 2020-09-18: 150 mg via INTRAVENOUS

## 2020-09-18 MED ORDER — GABAPENTIN 300 MG PO CAPS
300.0000 mg | ORAL_CAPSULE | ORAL | Status: AC
  Administered 2020-09-18: 300 mg via ORAL

## 2020-09-18 MED ORDER — PROMETHAZINE HCL 25 MG/ML IJ SOLN: 6.2500 mg | INTRAMUSCULAR | Status: DC | PRN

## 2020-09-18 MED ORDER — FENTANYL CITRATE (PF) 100 MCG/2ML IJ SOLN
INTRAMUSCULAR | Status: DC | PRN
  Administered 2020-09-18 (×2): 50 ug via INTRAVENOUS

## 2020-09-18 MED ORDER — LIDOCAINE HCL 1 % IJ SOLN
INTRAMUSCULAR | Status: DC | PRN
  Administered 2020-09-18: 10 mL

## 2020-09-18 MED ORDER — POVIDONE-IODINE 10 % EX SWAB: 2.0000 "application " | Freq: Once | CUTANEOUS | Status: DC

## 2020-09-18 MED ORDER — MIDAZOLAM HCL 5 MG/5ML IJ SOLN: INTRAMUSCULAR | Status: DC | PRN

## 2020-09-18 MED ORDER — SODIUM CHLORIDE 0.9 % IR SOLN
Status: DC | PRN
  Administered 2020-09-18 (×2): 3000 mL

## 2020-09-18 MED ORDER — PROPOFOL 10 MG/ML IV BOLUS
INTRAVENOUS | Status: AC
  Filled 2020-09-18: qty 20

## 2020-09-18 MED ORDER — ONDANSETRON HCL 4 MG/2ML IJ SOLN
INTRAMUSCULAR | Status: DC | PRN
  Administered 2020-09-18: 4 mg via INTRAVENOUS

## 2020-09-18 MED ORDER — ONDANSETRON HCL 4 MG/2ML IJ SOLN
INTRAMUSCULAR | Status: AC
  Filled 2020-09-18: qty 2

## 2020-09-18 MED ORDER — DEXAMETHASONE SODIUM PHOSPHATE 10 MG/ML IJ SOLN
INTRAMUSCULAR | Status: DC | PRN
  Administered 2020-09-18: 5 mg via INTRAVENOUS

## 2020-09-18 MED ORDER — LACTATED RINGERS IV SOLN: INTRAVENOUS | Status: DC

## 2020-09-18 MED ORDER — DEXAMETHASONE SODIUM PHOSPHATE 10 MG/ML IJ SOLN
INTRAMUSCULAR | Status: AC
  Filled 2020-09-18: qty 1

## 2020-09-18 MED ORDER — OXYCODONE HCL 5 MG PO TABS: 5.0000 mg | ORAL_TABLET | Freq: Once | ORAL | Status: DC | PRN

## 2020-09-18 MED ORDER — LIDOCAINE 2% (20 MG/ML) 5 ML SYRINGE
INTRAMUSCULAR | Status: DC | PRN
  Administered 2020-09-18: 80 mg via INTRAVENOUS

## 2020-09-18 MED ORDER — ACETAMINOPHEN 500 MG PO TABS
ORAL_TABLET | ORAL | Status: AC
  Filled 2020-09-18: qty 2

## 2020-09-18 MED ORDER — ACETAMINOPHEN 500 MG PO TABS
1000.0000 mg | ORAL_TABLET | Freq: Once | ORAL | Status: AC
  Administered 2020-09-18: 1000 mg via ORAL

## 2020-09-18 MED ORDER — FENTANYL CITRATE (PF) 100 MCG/2ML IJ SOLN
INTRAMUSCULAR | Status: AC
  Filled 2020-09-18: qty 2

## 2020-09-18 MED ORDER — ARTIFICIAL TEARS OPHTHALMIC OINT
TOPICAL_OINTMENT | OPHTHALMIC | Status: AC
  Filled 2020-09-18: qty 3.5

## 2020-09-18 MED ORDER — OXYCODONE HCL 5 MG/5ML PO SOLN: 5.0000 mg | Freq: Once | ORAL | Status: DC | PRN

## 2020-09-18 MED ORDER — GABAPENTIN 300 MG PO CAPS
ORAL_CAPSULE | ORAL | Status: AC
  Filled 2020-09-18: qty 1

## 2020-09-18 SURGICAL SUPPLY — 16 items
DEVICE MYOSURE REACH (MISCELLANEOUS) ×2 IMPLANT
DILATOR CANAL MILEX (MISCELLANEOUS) ×2 IMPLANT
GAUZE 4X4 16PLY ~~LOC~~+RFID DBL (SPONGE) ×4 IMPLANT
GLOVE SURG ENC MOIS LTX SZ6.5 (GLOVE) ×2 IMPLANT
GLOVE SURG POLYISO LF SZ6.5 (GLOVE) ×2 IMPLANT
GLOVE SURG UNDER POLY LF SZ6.5 (GLOVE) ×2 IMPLANT
GLOVE SURG UNDER POLY LF SZ7 (GLOVE) ×2 IMPLANT
GOWN STRL REUS W/TWL LRG LVL3 (GOWN DISPOSABLE) ×4 IMPLANT
IV NS IRRIG 3000ML ARTHROMATIC (IV SOLUTION) ×4 IMPLANT
KIT PROCEDURE FLUENT (KITS) ×2 IMPLANT
KIT TURNOVER CYSTO (KITS) ×2 IMPLANT
PACK VAGINAL MINOR WOMEN LF (CUSTOM PROCEDURE TRAY) ×2 IMPLANT
PAD OB MATERNITY 4.3X12.25 (PERSONAL CARE ITEMS) ×2 IMPLANT
PAD PREP 24X48 CUFFED NSTRL (MISCELLANEOUS) ×2 IMPLANT
SEAL ROD LENS SCOPE MYOSURE (ABLATOR) ×2 IMPLANT
TOWEL OR 17X26 10 PK STRL BLUE (TOWEL DISPOSABLE) ×2 IMPLANT

## 2020-09-18 NOTE — Transfer of Care (Signed)
Immediate Anesthesia Transfer of Care Note  Patient: [name]  Procedure(s) Performed: DILATATION AND CURETTAGE /HYSTEROSCOPY/ POLYPECTOMY (Vagina )  Patient Location: PACU  Anesthesia Type:General  Level of Consciousness: awake, alert , oriented and patient cooperative  Airway & Oxygen Therapy: Patient Spontanous Breathing  Post-op Assessment: Report given to RN and Post -op Vital signs reviewed and stable  Post vital signs: Reviewed and stable  Last Vitals:  Vitals Value Taken Time  BP 141/63 09/18/20 0834  Temp    Pulse 69 09/18/20 0836  Resp 16 09/18/20 0836  SpO2 93 % 09/18/20 0836  Vitals shown include unvalidated device data.  Last Pain:  Vitals:   09/18/20 0613  TempSrc: Oral  PainSc: 0-No pain      Patients Stated Pain Goal: 3 (71/83/67 2550)  Complications: No notable events documented.

## 2020-09-18 NOTE — Interval H&P Note (Signed)
History and Physical Interval Note:  09/18/2020 7:02 AM  [name]  has presented today for surgery, with the diagnosis of pain in pelvis.  The various methods of treatment have been discussed with the patient and family. After consideration of risks, benefits and other options for treatment, the patient has consented to  Procedure(s): DILATATION AND CURETTAGE /HYSTEROSCOPY (N/A) as a surgical intervention.  The patient's history has been reviewed, patient examined, no change in status, stable for surgery.  I have reviewed the patient's chart and labs.  Questions were answered to the patient's satisfaction.     Colleen Hancock

## 2020-09-18 NOTE — Op Note (Signed)
NAME: Colleen Hancock, Colleen Hancock MEDICAL RECORD NO: [id number] ACCOUNT NO: [id number] DATE OF BIRTH: 21-Jan-1946 FACILITY: [facility] LOCATION: [facility] PHYSICIAN: Janyth Contes, MD  Operative Report   DATE OF PROCEDURE: 09/18/2020   PREOPERATIVE DIAGNOSIS:  Thickened endometrial stripe with history of tamoxifen use.  POSTOPERATIVE DIAGNOSES:  Thickened endometrial stripe with history of tamoxifen use, uterine polyps.  PROCEDURE:  Hysteroscopy, D and C with MyoSure excision of polyps.  SURGEON: Janyth Contes, MD  ANESTHESIA:  Local and general by LMA.  ESTIMATED BLOOD LOSS:  Approximately 20 mL  IV FLUID AND URINE OUTPUT:  Per anesthesia.  COMPLICATIONS:  None.  PATHOLOGY:  Uterine polyps and endometrial curettings.  DESCRIPTION OF PROCEDURE:  After informed consent was reviewed with the patient including risks, benefits and alternatives of the surgical procedures, she was transported to the operating room and placed on the table in supine position.  General  anesthesia was then found to be adequate.  She was then prepped and draped in the normal sterile fashion, placed in the [place] stirrups.  Prior to coming to the OR she had gone to the restroom.  Her bladder was not drained.  Using an open-sided  speculum, her cervix was easily visualized and a 10 mL paracervical block was placed.  Cervix was then dilated to accommodate hysteroscope first using the disposable dilator and then using Pratt dilators, the hysteroscope was introduced into her cavity  and large polyps were noted on the anterior uterine wall.  Normal ostia were noted bilaterally using the MyoSure device, the polyps were removed.  A sharp curettage was also performed.  The instruments were removed from the vagina.  Patient was awakened  in stable condition.  Sponge, lap and needle counts were correct x 2 per the operating staff.     Elián.[email or web address] D: 09/18/2020 8:41:17 am T: 09/18/2020 9:02:00 am  JOB: [id number]/ [id number]

## 2020-09-18 NOTE — Discharge Instructions (Signed)
   D & C Home care Instructions:   Personal hygiene:  Used sanitary napkins for vaginal drainage not tampons. Shower or tub bathe the day after your procedure. No douching until bleeding stops. Always wipe from front to back after  Elimination.  Activity: Do not drive or operate any equipment today. The effects of the anesthesia are still present and drowsiness may result. Rest today, not necessarily flat bed rest, just take it easy. You may resume your normal activity in one to 2 days.  Sexual activity: No intercourse for one week or as indicated by your physician  Diet: Eat a light diet as desired this evening. You may resume a regular diet tomorrow.  Return to work: One to 2 days.  General Expectations of your surgery: Vaginal bleeding should be no heavier than a normal period. Spotting may continue up to 10 days. Mild cramps may continue for a couple of days. You may have a regular period in 2-6 weeks.  Unexpected observations call your doctor if these occur: persistent or heavy bleeding. Severe abdominal cramping or pain. Elevation of temperature greater than 100F.      Post Anesthesia Home Care Instructions  Activity: Get plenty of rest for the remainder of the day. A responsible individual must stay with you for 24 hours following the procedure.  For the next 24 hours, DO NOT: -Drive a car -Operate machinery -Drink alcoholic beverages -Take any medication unless instructed by your physician -Make any legal decisions or sign important papers.  Meals: Start with liquid foods such as gelatin or soup. Progress to regular foods as tolerated. Avoid greasy, spicy, heavy foods. If nausea and/or vomiting occur, drink only clear liquids until the nausea and/or vomiting subsides. Call your physician if vomiting continues.  Special Instructions/Symptoms: Your throat may feel dry or sore from the anesthesia or the breathing tube placed in your throat during surgery. If this causes  discomfort, gargle with warm salt water. The discomfort should disappear within 24 hours.     

## 2020-09-18 NOTE — Anesthesia Procedure Notes (Signed)
Procedure Name: LMA Insertion Date/Time: 09/18/2020 7:47 AM Performed by: Rogers Blocker, CRNA Pre-anesthesia Checklist: Patient identified, Emergency Drugs available, Suction available and Patient being monitored Patient Re-evaluated:Patient Re-evaluated prior to induction Oxygen Delivery Method: Circle System Utilized Preoxygenation: Pre-oxygenation with 100% oxygen Induction Type: IV induction Ventilation: Mask ventilation without difficulty LMA: LMA inserted LMA Size: 4.0 Number of attempts: 1 Placement Confirmation: positive ETCO2 Tube secured with: Tape Dental Injury: Teeth and Oropharynx as per pre-operative assessment

## 2020-09-18 NOTE — Brief Op Note (Signed)
09/18/2020  8:34 AM  PATIENT:  Colleen Hancock  75 y.o. female  PRE-OPERATIVE DIAGNOSIS:  thickened endometrial stripe, h/o tamoxifen use  POST-OPERATIVE DIAGNOSIS:  thickened endometrial stripe, h/o tamoxifen use, uterine polyps  PROCEDURE:  Procedure(s): DILATATION AND CURETTAGE /HYSTEROSCOPY/ POLYPECTOMY (N/A), myosure  SURGEON:  Surgeon(s) and Role:    * Bovard-Stuckert, Fynlee Rowlands, MD - Primary  ANESTHESIA:   local and general by LMA  EBL:  20 mL IVF and uop per anesthesia  BLOOD ADMINISTERED:none  DRAINS: none   LOCAL MEDICATIONS USED:  LIDOCAINE   SPECIMEN:  Source of Specimen:  uterine polyps and endometrial currettings  DISPOSITION OF SPECIMEN:  PATHOLOGY  COUNTS:  YES  TOURNIQUET:  * No tourniquets in log *  DICTATION: .Other Dictation: Dictation Number [id number]  PLAN OF CARE: Discharge to home after PACU  PATIENT DISPOSITION:  PACU - hemodynamically stable.   Delay start of Pharmacological VTE agent (>24hrs) due to surgical blood loss or risk of bleeding: not applicable

## 2020-09-18 NOTE — Anesthesia Postprocedure Evaluation (Signed)
Anesthesia Post Note  Patient: Donnamae Muilenburg  Procedure(s) Performed: DILATATION AND CURETTAGE /HYSTEROSCOPY/ POLYPECTOMY (Vagina )     Patient location during evaluation: PACU Anesthesia Type: General Level of consciousness: awake and alert and oriented Pain management: pain level controlled Vital Signs Assessment: post-procedure vital signs reviewed and stable Respiratory status: spontaneous breathing, nonlabored ventilation and respiratory function stable Cardiovascular status: blood pressure returned to baseline Postop Assessment: no apparent nausea or vomiting Anesthetic complications: no   No notable events documented.  Last Vitals:  Vitals:   09/18/20 0900 09/18/20 0915  BP: (!) 155/60 (!) 150/55  Pulse: 62 68  Resp: 17 18  Temp:    SpO2: 98% 98%    Last Pain:  Vitals:   09/18/20 0900  TempSrc:   PainSc: 0-No pain                 Brennan Bailey

## 2020-09-21 ENCOUNTER — Encounter (HOSPITAL_BASED_OUTPATIENT_CLINIC_OR_DEPARTMENT_OTHER): Payer: Self-pay | Admitting: Obstetrics and Gynecology

## 2020-09-21 LAB — SURGICAL PATHOLOGY

## 2020-09-23 ENCOUNTER — Ambulatory Visit (INDEPENDENT_AMBULATORY_CARE_PROVIDER_SITE_OTHER): Payer: Medicare Other | Admitting: Gastroenterology

## 2020-09-23 ENCOUNTER — Encounter: Payer: Self-pay | Admitting: Gastroenterology

## 2020-09-23 VITALS — BP 136/60 | HR 88 | Ht 62.0 in | Wt 200.5 lb

## 2020-09-23 DIAGNOSIS — R195 Other fecal abnormalities: Secondary | ICD-10-CM | POA: Diagnosis not present

## 2020-09-23 NOTE — Progress Notes (Signed)
HPI: This is a very pleasant 75 year old woman who was referred to me by Velna Hatchet, MD  to evaluate microscopic blood in her stool.    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  She takes dicyclomine and cholestyramine for this and while taking those medicines her symptoms are quite well controlled.  If she does not take these she has significant chronic loose stools.  Her gallbladder was removed many many years ago.  She had left lower quadrant abdominal pains about 6 weeks ago with some tenderness.  Her belly felt very tight.  She did not have any fevers or chills.  She never had imaging tests or blood work.  She saw her primary care team shortly afterwards and was told that she probably had diverticulitis.  Has a part of her routine physical recently she was told that she had microscopic blood in her stool.  She never sees blood in her stool.  Colon cancer does not run in her family.  She tells me she has had about 10 colonoscopies over the many years.  She has never had polyps.  Colon cancer does not run in her family.  She said the reason she has had so many colonoscopies in the past is because every time she ever moved she would reestablish in the area and she was told she needed a colonoscopy.  I am not really sure why that would be.  She had trouble with sedation during colonoscopy related to fentanyl, she was hard to wake afterwards.  Her most recent colonoscopy for colon cancer screening was in 2013.  She tells me they used to preventing sedation was absolutely perfect for it.  Old Data Reviewed:  Blood work July 2022 normal complete metabolic profile, normal CBC    Review of systems: Pertinent positive and negative review of systems were noted in the above HPI section. All other review negative.   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   Complication of anesthesia    during colonoscopy fentanyl dropped blood pressure 2013 or 2014   Diverticulitis 09/14/2020   no meds currently   Dyspnea 09/14/2020   on exertion can climb flight of steps   Fatty liver 09/14/2020   Heart murmur 09/14/2020   mild no cardiologist   High cholesterol 09/14/2020   History of migraine    none since 1990's   Hypertension 09/14/2020   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   with diarrhea   Pneumonia    yrs ago per pt on 09-14-2020   Swelling of joint of left shoulder 09/14/2020   swelling under left shoulder, 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   Thickened endometrium 09/14/2020   Wears glasses 09/14/2020    Past Surgical History:  Procedure Laterality Date   BREAST BIOPSY Left 10/2012   Stereo- Malignant   BREAST BIOPSY Left    U/S Core- Benign   BREAST EXCISIONAL BIOPSY Left    BREAST EXCISIONAL BIOPSY Left    BREAST LUMPECTOMY Left 11/2012   radiation tx done x 34 treatments   [place]  2010   laparoscopic   COLONOSCOPY     2013 or 2014   [place]   HYSTEROSCOPY WITH D & C N/A  09/18/2020   Procedure: DILATATION AND CURETTAGE /HYSTEROSCOPY/ POLYPECTOMY;  Surgeon: Janyth Contes, MD;  Location: [facility];  Service: Gynecology;  Laterality: N/A;   MASTECTOMY Left 2018   MASTECTOMY W/ SENTINEL NODE BIOPSY Left 08/18/2016   MASTECTOMY W/ SENTINEL NODE BIOPSY Left 08/18/2016   Procedure: LEFT TOTAL MASTECTOMY WITH SENTINEL LYMPH NODE BIOPSY WITH BLUE DYE INJECTION;  Surgeon: Rolm Bookbinder, MD;  Location: [facility];  Service: General;  Laterality: Left;   SKIN CANCER EXCISION Left 2009   eye brow    Current Outpatient Medications  Medication Sig Dispense Refill   albuterol (PROVENTIL HFA;VENTOLIN HFA) 108 (90 BASE) MCG/ACT inhaler Inhale 1 puff into the lungs every 6 (six) hours as needed  for wheezing or shortness of breath.      atorvastatin (LIPITOR) 20 MG tablet Take 20 mg by mouth daily before breakfast.     cetirizine (ZYRTEC ALLERGY) 10 MG tablet Take 1 tablet (10 mg total) by mouth daily. (Patient taking differently: Take 10 mg by mouth every evening.)     cholecalciferol (VITAMIN D) 1000 units tablet Take 1,000 Units by mouth every evening.     cholestyramine (QUESTRAN) 4 GM/DOSE powder Take 0.75 packets by mouth daily before breakfast. 3/4 scoop (MUST TAKE 1 hour after dicyclomine)     dicyclomine (BENTYL) 10 MG capsule Take 10 mg by mouth daily at 6 (six) AM. (1st thing in the morning)     fluticasone (VERAMYST) 27.5 MCG/SPRAY nasal spray Place 1 spray into the nose daily.     lisinopril (ZESTRIL) 20 MG tablet Take 20 mg by mouth every evening.     Multiple Vitamins-Minerals (MULTIVITAMIN GUMMIES WOMENS PO) Take 1 tablet by mouth every evening. One-A-Day Women's  pill     Omega-3 Fatty Acids (FISH OIL PO) Take 720 mg by mouth every evening.     Polyethyl Glycol-Propyl Glycol 0.4-0.3 % SOLN Place 1 drop into both eyes daily as needed (for dry/irritated eyes.).     traMADol (ULTRAM) 50 MG tablet Take 1 tablet (50 mg total) by mouth every 6 (six) hours as needed for moderate pain (for pain.). 20 tablet 0   No current facility-administered medications for this visit.    Allergies as of 09/23/2020 - Review Complete 09/23/2020  Allergen Reaction Noted   Fentanyl Other (See Comments) 01/26/2016   Tetracyclines & related Hives 02/26/2011   Other  08/11/2016   Sodium lactate Diarrhea 03/09/2018   Clams [shellfish allergy] Diarrhea 04/11/2014   Nsaids Nausea And Vomiting 08/11/2016   Pentazocine lactate Nausea And Vomiting 02/26/2011    Family History  Problem Relation Age of Onset   Heart disease Other    Breast cancer Maternal Aunt    Breast cancer Cousin    Breast cancer Cousin    Breast cancer Cousin     Social History   Socioeconomic History   Marital  status: Married    Spouse name: Not on file   Number of children: Not on file   Years of education: Not on file   Highest education level: Not on file  Occupational History   Not on file  Tobacco Use   Smoking status: Never   Smokeless tobacco: Never  Vaping Use   Vaping Use: Never used  Substance and Sexual Activity   Alcohol use: Yes    Comment: 1 glass wine at new years only   Drug use: No   Sexual activity: Not on file  Other Topics Concern   Not on file  Social History Narrative   Not on file   Social Determinants of Health   Financial Resource Strain: Not on file  Food Insecurity: Not on file  Transportation Needs: Not on file  Physical Activity: Not on file  Stress: Not on file  Social Connections: Not on file  Intimate Partner Violence: Not on file     Physical Exam: Ht 5\' 2"  (1.575 m) Comment: height measured without shoes  Wt 200 lb 8 oz (90.9 kg)   BMI 36.67 kg/m  Constitutional: generally well-appearing Psychiatric: alert and oriented x3 Eyes: extraocular movements intact Mouth: oral pharynx moist, no lesions Neck: supple no lymphadenopathy Cardiovascular: heart regular rate and rhythm Lungs: clear to auscultation bilaterally Abdomen: soft, nontender, nondistended, no obvious ascites, no peritoneal signs, normal bowel sounds Extremities: no lower extremity edema bilaterally Skin: no lesions on visible extremities   Assessment and plan: 75 y.o. 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  I think it is unlikely that she has anything serious but certainly it is best to be safe.  She indeed might have had diverticulitis 6 weeks ago.  Her symptoms have completely resolved and so I think she would be at routine  risk for complications during colonoscopy.  I explained that she should continue taking her dicyclomine and cholestyramine for her "irritable bowel syndrome diarrhea predominant".  This regimen works for her.  I do have some thought that she might actually have loose stools related to her gallbladder being removed many years ago.   Please see the "Patient Instructions" section for addition details about the plan.   Owens Loffler, MD [facility] 09/23/2020, 1:28 PM  Cc: Velna Hatchet, MD  Total time on date of encounter was 45 minutes (this included time spent preparing to see the patient reviewing records; obtaining and/or reviewing separately obtained history; performing a medically appropriate exam and/or evaluation; counseling and educating the patient and family if present; ordering medications, tests or procedures if applicable; and documenting clinical information in the health record).

## 2020-09-23 NOTE — Patient Instructions (Signed)
If you are age 76 or older, your body mass index should be between 23-30. Your Body mass index is 36.67 kg/m. If this is out of the aforementioned range listed, please consider follow up with your Primary Care Provider. _________________________________________________________  The [facility] providers would like to encourage you to use [facility] to communicate with providers for non-urgent requests or questions.  Due to long hold times on the telephone, sending your provider a message by [facility] may be a faster and more efficient way to get a response.  Please allow 48 business hours for a response.  Please remember that this is for non-urgent requests.  _________________________________________________________ Dennis Bast have been scheduled for a colonoscopy. Please follow written instructions given to you at your visit today.  Please pick up your prep supplies at the pharmacy within the next 1-3 days. If you use inhalers (even only as needed), please bring them with you on the day of your procedure.  Due to recent changes in healthcare laws, you may see the results of your imaging and laboratory studies on MyChart before your provider has had a chance to review them.  We understand that in some cases there may be results that are confusing or concerning to you. Not all laboratory results come back in the same time frame and the provider may be waiting for multiple results in order to interpret others.  Please give [place] 48 hours in order for your provider to thoroughly review all the results before contacting the office for clarification of your results.   Thank you for entrusting me with your care and choosing [facility].  Dr Ardis Hughs

## 2020-10-05 ENCOUNTER — Telehealth: Payer: Self-pay | Admitting: Gastroenterology

## 2020-10-05 NOTE — Telephone Encounter (Signed)
Inbound call from patient. Have upcoming procedure 8/17. Wants to know if dicyclomine and cholestyramine is okay to take the morning of.

## 2020-10-05 NOTE — Telephone Encounter (Signed)
Spoke with patients husband Hollice Espy that patient can take all of her regular medications  prior to the procedure. Hollice Espy verbalized understanding with no further questions.

## 2020-10-21 ENCOUNTER — Other Ambulatory Visit: Payer: Self-pay

## 2020-10-21 ENCOUNTER — Encounter: Payer: Self-pay | Admitting: Gastroenterology

## 2020-10-21 ENCOUNTER — Ambulatory Visit (AMBULATORY_SURGERY_CENTER): Payer: Medicare Other | Admitting: Gastroenterology

## 2020-10-21 VITALS — BP 156/57 | HR 72 | Temp 97.8°F | Resp 20 | Ht 62.0 in | Wt 200.0 lb

## 2020-10-21 DIAGNOSIS — K5730 Diverticulosis of large intestine without perforation or abscess without bleeding: Secondary | ICD-10-CM

## 2020-10-21 DIAGNOSIS — R195 Other fecal abnormalities: Secondary | ICD-10-CM

## 2020-10-21 DIAGNOSIS — D120 Benign neoplasm of cecum: Secondary | ICD-10-CM | POA: Diagnosis not present

## 2020-10-21 DIAGNOSIS — C180 Malignant neoplasm of cecum: Secondary | ICD-10-CM

## 2020-10-21 DIAGNOSIS — E669 Obesity, unspecified: Secondary | ICD-10-CM | POA: Diagnosis not present

## 2020-10-21 DIAGNOSIS — J45909 Unspecified asthma, uncomplicated: Secondary | ICD-10-CM | POA: Diagnosis not present

## 2020-10-21 MED ORDER — SODIUM CHLORIDE 0.9 % IV SOLN: 500.0000 mL | Freq: Once | INTRAVENOUS | Status: DC

## 2020-10-21 NOTE — Op Note (Signed)
[facility] Patient Name: Colleen Hancock Procedure Date: 10/21/2020 1:56 PM MRN: [id number] Endoscopist: Milus Banister , MD Age: 75 Referring MD:  Date of Birth: [birth year] Gender: Female Account #: [id number] Procedure:                Colonoscopy Indications:              Heme positive stool Medicines:                Monitored Anesthesia Care Procedure:                Pre-Anesthesia Assessment:                           - Prior to the procedure, a History and Physical                            was performed, and patient medications and                            allergies were reviewed. The patient's tolerance of                            previous anesthesia was also reviewed. The risks                            and benefits of the procedure and the sedation                            options and risks were discussed with the patient.                            All questions were answered, and informed consent                            was obtained. Prior Anticoagulants: The patient has                            taken no previous anticoagulant or antiplatelet                            agents. ASA Grade Assessment: II - A patient with                            mild systemic disease. After reviewing the risks                            and benefits, the patient was deemed in                            satisfactory condition to undergo the procedure.                           After obtaining informed consent, the colonoscope  was passed under direct vision. Throughout the                            procedure, the patient's blood pressure, pulse, and                            oxygen saturations were monitored continuously. The                            CF [id number] was introduced through the anus                            and advanced to the the cecum, identified by                            appendiceal orifice and ileocecal valve.  The                            colonoscopy was performed without difficulty. The                            patient tolerated the procedure well. The quality                            of the bowel preparation was good. The ileocecal                            valve, appendiceal orifice, and rectum were                            photographed. Scope In: 1:59:15 PM Scope Out: 2:11:20 PM Scope Withdrawal Time: 0 hours 9 minutes 14 seconds  Total Procedure Duration: 0 hours 12 minutes 5 seconds  Findings:                 A 10 mm polyp was found in the appendiceal orifice.                            The polyp was sessile. The polyp was removed with a                            hot snare. Resection and retrieval were complete.                           Multiple small and large-mouthed diverticula were                            found in the left colon.                           The exam was otherwise without abnormality on                            direct and retroflexion views. Complications:  No immediate complications. Estimated blood loss:                            None. Estimated Blood Loss:     Estimated blood loss: none. Impression:               - One 10 mm polyp at the appendiceal orifice,                            removed with a hot snare. Resected and retrieved.                           - Diverticulosis in the left colon.                           - The examination was otherwise normal on direct                            and retroflexion views. Recommendation:           - Patient has a contact number available for                            emergencies. The signs and symptoms of potential                            delayed complications were discussed with the                            patient. Return to normal activities tomorrow.                            Written discharge instructions were provided to the                            patient.                            - Resume previous diet.                           - Continue present medications.                           - Await pathology results. Milus Banister, MD 10/21/2020 2:13:36 PM This report has been signed electronically.

## 2020-10-21 NOTE — Progress Notes (Signed)
pt tolerated well. VSS. awake and to recovery. Report given to RN.  

## 2020-10-21 NOTE — Progress Notes (Signed)
The recent H&P (less than 30 days ago) was reviewed, the patient was examined and there is no change in the patients condition since that H&P was completed

## 2020-10-21 NOTE — Progress Notes (Signed)
Called to room to assist during endoscopic procedure.  Patient ID and intended procedure confirmed with present staff. Received instructions for my participation in the procedure from the performing physician.  

## 2020-10-21 NOTE — Patient Instructions (Signed)
Information on polyps and diverticulosis given to you today.  Await pathology results.  Resume previous diet and medications.  YOU HAD AN ENDOSCOPIC PROCEDURE TODAY AT THE [facility]:   Refer to the procedure report that was given to you for any specific questions about what was found during the examination.  If the procedure report does not answer your questions, please call your gastroenterologist to clarify.  If you requested that your care partner not be given the details of your procedure findings, then the procedure report has been included in a sealed envelope for you to review at your convenience later.  YOU SHOULD EXPECT: Some feelings of bloating in the abdomen. Passage of more gas than usual.  Walking can help get rid of the air that was put into your GI tract during the procedure and reduce the bloating. If you had a lower endoscopy (such as a colonoscopy or flexible sigmoidoscopy) you may notice spotting of blood in your stool or on the toilet paper. If you underwent a bowel prep for your procedure, you may not have a normal bowel movement for a few days.  Please Note:  You might notice some irritation and congestion in your nose or some drainage.  This is from the oxygen used during your procedure.  There is no need for concern and it should clear up in a day or so.  SYMPTOMS TO REPORT IMMEDIATELY:   Following lower endoscopy (colonoscopy or flexible sigmoidoscopy):  Excessive amounts of blood in the stool  Significant tenderness or worsening of abdominal pains  Swelling of the abdomen that is new, acute  Fever of 100F or higher   For urgent or emergent issues, a gastroenterologist can be reached at any hour by calling [phone]. Do not use MyChart messaging for urgent concerns.    DIET:  We do recommend a small meal at first, but then you may proceed to your regular diet.  Drink plenty of fluids but you should avoid alcoholic beverages for 24  hours.  ACTIVITY:  You should plan to take it easy for the rest of today and you should NOT DRIVE or use heavy machinery until tomorrow (because of the sedation medicines used during the test).    FOLLOW UP: Our staff will call the number listed on your records 48-72 hours following your procedure to check on you and address any questions or concerns that you may have regarding the information given to you following your procedure. If we do not reach you, we will leave a message.  We will attempt to reach you two times.  During this call, we will ask if you have developed any symptoms of COVID 19. If you develop any symptoms (ie: fever, flu-like symptoms, shortness of breath, cough etc.) before then, please call [phone].  If you test positive for Covid 19 in the 2 weeks post procedure, please call and report this information to us.    If any biopsies were taken you will be contacted by phone or by letter within the next 1-3 weeks.  Please call us at [phone] if you have not heard about the biopsies in 3 weeks.    SIGNATURES/CONFIDENTIALITY: You and/or your care partner have signed paperwork which will be entered into your electronic medical record.  These signatures attest to the fact that that the information above on your After Visit Summary has been reviewed and is understood.  Full responsibility of the confidentiality of this discharge information lies with you and/or   your care-partner. 

## 2020-10-23 ENCOUNTER — Telehealth: Payer: Self-pay | Admitting: *Deleted

## 2020-10-23 NOTE — Telephone Encounter (Signed)
  Follow up Call-  Call back number 10/21/2020  Post procedure Call Back phone  # [phone]  Permission to leave phone message Yes  Some recent data might be hidden     Patient questions:  Do you have a fever, pain , or abdominal swelling? No. Pain Score  0 *  Have you tolerated food without any problems? Yes.    Have you been able to return to your normal activities? Yes.    Do you have any questions about your discharge instructions: Diet   No. Medications  No. Follow up visit  No.  Do you have questions or concerns about your Care? No.  Actions: * If pain score is 4 or above: No action needed, pain <4.  Have you developed a fever since your procedure? no  2.   Have you had an respiratory symptoms (SOB or cough) since your procedure? no  3.   Have you tested positive for COVID 19 since your procedure no  4.   Have you had any family members/close contacts diagnosed with the COVID 19 since your procedure?  no   If yes to any of these questions please route to Joylene John, RN and Joella Prince, RN

## 2020-10-23 NOTE — Telephone Encounter (Signed)
  Follow up Call-  Call back number 10/21/2020  Post procedure Call Back phone  # [phone]  Permission to leave phone message Yes  Some recent data might be hidden    First attempt for follow up phone call. No answer at number given.  Left message on voicemail.

## 2020-10-28 ENCOUNTER — Other Ambulatory Visit: Payer: Self-pay

## 2020-10-28 DIAGNOSIS — C189 Malignant neoplasm of colon, unspecified: Secondary | ICD-10-CM

## 2020-10-30 ENCOUNTER — Other Ambulatory Visit (INDEPENDENT_AMBULATORY_CARE_PROVIDER_SITE_OTHER): Payer: Medicare Other

## 2020-10-30 DIAGNOSIS — C189 Malignant neoplasm of colon, unspecified: Secondary | ICD-10-CM | POA: Diagnosis not present

## 2020-10-30 LAB — COMPREHENSIVE METABOLIC PANEL
ALT: 23 U/L (ref 0–35)
AST: 22 U/L (ref 0–37)
Albumin: 4.5 g/dL (ref 3.5–5.2)
Alkaline Phosphatase: 67 U/L (ref 39–117)
BUN: 25 mg/dL — ABNORMAL HIGH (ref 6–23)
CO2: 26 mEq/L (ref 19–32)
Calcium: 9.5 mg/dL (ref 8.4–10.5)
Chloride: 105 mEq/L (ref 96–112)
Creatinine, Ser: 1.05 mg/dL (ref 0.40–1.20)
GFR: 51.91 mL/min — ABNORMAL LOW (ref >60.00)
Glucose, Bld: 116 mg/dL — ABNORMAL HIGH (ref 70–99)
Potassium: 4.1 mEq/L (ref 3.5–5.1)
Sodium: 141 mEq/L (ref 135–145)
Total Bilirubin: 0.6 mg/dL (ref 0.2–1.2)
Total Protein: 7.4 g/dL (ref 6.0–8.3)

## 2020-10-30 LAB — CBC WITH DIFFERENTIAL/PLATELET
Basophils Absolute: 0 10*3/uL (ref 0.0–0.1)
Basophils Relative: 0.4 % (ref 0.0–3.0)
Eosinophils Absolute: 0.1 10*3/uL (ref 0.0–0.7)
Eosinophils Relative: 0.9 % (ref 0.0–5.0)
HCT: 38.8 % (ref 36.0–46.0)
Hemoglobin: 13.2 g/dL (ref 12.0–15.0)
Lymphocytes Relative: 25.4 % (ref 12.0–46.0)
Lymphs Abs: 1.6 10*3/uL (ref 0.7–4.0)
MCHC: 34 g/dL (ref 30.0–36.0)
MCV: 96.4 fl (ref 78.0–100.0)
Monocytes Absolute: 0.4 10*3/uL (ref 0.1–1.0)
Monocytes Relative: 5.5 % (ref 3.0–12.0)
Neutro Abs: 4.3 10*3/uL (ref 1.4–7.7)
Neutrophils Relative %: 67.8 % (ref 43.0–77.0)
Platelets: 165 10*3/uL (ref 150.0–400.0)
RBC: 4.03 Mil/uL (ref 3.87–5.11)
RDW: 13.1 % (ref 11.5–15.5)
WBC: 6.4 10*3/uL (ref 4.0–10.5)

## 2020-10-31 LAB — CEA: CEA: 1.6 ng/mL

## 2020-11-05 ENCOUNTER — Ambulatory Visit (HOSPITAL_COMMUNITY): Payer: Medicare Other

## 2020-11-05 ENCOUNTER — Other Ambulatory Visit: Payer: Self-pay

## 2020-11-05 ENCOUNTER — Ambulatory Visit (HOSPITAL_COMMUNITY)
Admission: RE | Admit: 2020-11-05 | Discharge: 2020-11-05 | Disposition: A | Discharge status: Home / Self Care | Payer: Medicare Other | Source: Ambulatory Visit | Attending: Gastroenterology | Admitting: Gastroenterology

## 2020-11-05 ENCOUNTER — Encounter (HOSPITAL_COMMUNITY): Payer: Self-pay

## 2020-11-05 DIAGNOSIS — C189 Malignant neoplasm of colon, unspecified: Secondary | ICD-10-CM | POA: Insufficient documentation

## 2020-11-05 DIAGNOSIS — K5730 Diverticulosis of large intestine without perforation or abscess without bleeding: Secondary | ICD-10-CM | POA: Diagnosis not present

## 2020-11-05 DIAGNOSIS — I739 Peripheral vascular disease, unspecified: Secondary | ICD-10-CM | POA: Diagnosis not present

## 2020-11-05 DIAGNOSIS — J984 Other disorders of lung: Secondary | ICD-10-CM | POA: Diagnosis not present

## 2020-11-05 DIAGNOSIS — Z853 Personal history of malignant neoplasm of breast: Secondary | ICD-10-CM | POA: Diagnosis not present

## 2020-11-05 DIAGNOSIS — M47814 Spondylosis without myelopathy or radiculopathy, thoracic region: Secondary | ICD-10-CM | POA: Diagnosis not present

## 2020-11-05 MED ORDER — IOHEXOL 350 MG/ML SOLN
75.0000 mL | Freq: Once | INTRAVENOUS | Status: AC | PRN
  Administered 2020-11-05: 75 mL via INTRAVENOUS

## 2020-11-16 ENCOUNTER — Ambulatory Visit: Payer: Self-pay | Admitting: Surgery

## 2020-11-16 DIAGNOSIS — E669 Obesity, unspecified: Secondary | ICD-10-CM | POA: Diagnosis not present

## 2020-11-16 DIAGNOSIS — Z636 Dependent relative needing care at home: Secondary | ICD-10-CM | POA: Diagnosis not present

## 2020-11-16 DIAGNOSIS — C180 Malignant neoplasm of cecum: Secondary | ICD-10-CM | POA: Diagnosis not present

## 2020-11-16 DIAGNOSIS — K580 Irritable bowel syndrome with diarrhea: Secondary | ICD-10-CM | POA: Diagnosis not present

## 2020-11-24 NOTE — Progress Notes (Addendum)
COVID swab appointment: 12/08/20  COVID Vaccine Completed: yes x5 Date COVID Vaccine completed: 04/28/19, 05/22/19 Has received booster: COVID vaccine manufacturer: Pfizer       Date of COVID positive in last 90 days: no  PCP - Velna Hatchet, MD Cardiologist - no  Chest x-ray - CT 11/07/20 Epic EKG - 09/15/20 Epic Stress Test - 12/18/15 Epic ECHO - no Cardiac Cath - no Pacemaker/ICD device last checked:no Spinal Cord Stimulator:no  Sleep Study - no CPAP -   Fasting Blood Sugar - no Checks Blood Sugar _____ times a day  Blood Thinner Instructions: no Aspirin Instructions: Last Dose:  Activity level: Can go up a flight of stairs and perform activities of daily living without stopping and without symptoms of chest pain. Does endorse SOB from time to time with exertion    Anesthesia review:   Patient denies shortness of breath, fever, cough and chest pain at PAT appointment   Patient verbalized understanding of instructions that were given to them at the PAT appointment. Patient was also instructed that they will need to review over the PAT instructions again at home before surgery.

## 2020-11-24 NOTE — Patient Instructions (Addendum)
DUE TO COVID-19 ONLY ONE VISITOR IS ALLOWED TO COME WITH YOU AND STAY IN THE WAITING ROOM ONLY DURING PRE OP AND PROCEDURE.   **NO VISITORS ARE ALLOWED IN THE SHORT STAY AREA OR RECOVERY ROOM!!**  IF YOU WILL BE ADMITTED INTO THE HOSPITAL YOU ARE ALLOWED ONLY TWO SUPPORT PEOPLE DURING VISITATION HOURS ONLY (10AM -8PM)   The support person(s) may change daily. The support person(s) must pass our screening, gel in and out, and wear a mask at all times, including in the patient's room. Patients must also wear a mask when staff or their support person are in the room.  No visitors under the age of 77. Any visitor under the age of 15 must be accompanied by an adult.    COVID SWAB TESTING MUST BE COMPLETED ON:  12/08/20 **MUST PRESENT COMPLETED FORM AT TESTING SITE**    [facility] (backside of the building) Open 8am-3pm. No appointment needed. You are not required to quarantine, however you are required to wear a well-fitted mask when you are out and around people not in your household.  Hand Hygiene often Do NOT share personal items Notify your provider if you are in close contact with someone who has COVID or you develop fever 100.4 or greater, new onset of sneezing, cough, sore throat, shortness of breath or body aches.       Your procedure is scheduled on: 12/10/20   Report to [facility] Main Entrance   Report to Short Stay at 5:15 AM   [facility])   Call this number if you have problems the morning of surgery [phone]   Do not eat food :After Midnight.   May have liquids until 4:30 AM day of surgery  CLEAR LIQUID DIET  Foods Allowed                                                                     Foods Excluded  Water, Black Coffee and tea (no milk or creamer)           liquids that you cannot  Plain Jell-O in any flavor  (No red)                                    see through such as: Fruit ices (not with fruit pulp)                                             milk, soups, orange juice              Iced Popsicles (No red)                                                All solid food  Apple juices Sports drinks like Gatorade (No red) Lightly seasoned clear broth or consume(fat free) Sugar  Drink 2 Ensure drinks the night before surgery, have done by 10pm.  Complete one Ensure drink the morning of surgery 3 hours prior to scheduled surgery by 4:30 AM     The day of surgery:  Drink ONE (1) Pre-Surgery Clear Ensure by 4:30 am the morning of surgery. Drink in one sitting. Do not sip.  This drink was given to you during your hospital  pre-op appointment visit. Nothing else to drink after completing the  Pre-Surgery Clear Ensure.          If you have questions, please contact your surgeon's office.     Oral Hygiene is also important to reduce your risk of infection.                                    Remember - BRUSH YOUR TEETH THE MORNING OF SURGERY WITH YOUR REGULAR TOOTHPASTE   Take these medicines the morning of surgery with A SIP OF WATER: Inhalers, Lipitor, Tramadol.                               You may not have any metal on your body including hair pins, jewelry, and body piercing             Do not wear make-up, lotions, powders, perfumes, or deodorant  Do not wear nail polish including gel and S&S, artificial/acrylic nails, or any other type of covering on natural nails including finger and toenails. If you have artificial nails, gel coating, etc. that needs to be removed by a nail salon please have this removed prior to surgery or surgery may need to be canceled/ delayed if the surgeon/ anesthesia feels like they are unable to be safely monitored.   Do not shave  48 hours prior to surgery.    Do not bring valuables to the hospital. [place].   Bring small overnight bag day of surgery.   Special Instructions: Bring a copy of  your healthcare power of attorney and living will documents         the day of surgery if you haven't scanned them before.   Please read over the following fact sheets you were given: IF YOU HAVE QUESTIONS ABOUT YOUR PRE-OP INSTRUCTIONS PLEASE CALL [facility] - Preparing for Surgery Before surgery, you can play an important role.  Because skin is not sterile, your skin needs to be as free of germs as possible.  You can reduce the number of germs on your skin by washing with CHG (chlorahexidine gluconate) soap before surgery.  CHG is an antiseptic cleaner which kills germs and bonds with the skin to continue killing germs even after washing. Please DO NOT use if you have an allergy to CHG or antibacterial soaps.  If your skin becomes reddened/irritated stop using the CHG and inform your nurse when you arrive at Short Stay. Do not shave (including legs and underarms) for at least 48 hours prior to the first CHG shower.  You may shave your face/neck.  Please follow these instructions carefully:  1.  Shower with CHG Soap the night before surgery and the  morning  of surgery.  2.  If you choose to wash your hair, wash your hair first as usual with your normal  shampoo.  3.  After you shampoo, rinse your hair and body thoroughly to remove the shampoo.                             4.  Use CHG as you would any other liquid soap.  You can apply chg directly to the skin and wash.  Gently with a scrungie or clean washcloth.  5.  Apply the CHG Soap to your body ONLY FROM THE NECK DOWN.   Do   not use on face/ open                           Wound or open sores. Avoid contact with eyes, ears mouth and   genitals (private parts).                       Wash face,  Genitals (private parts) with your normal soap.             6.  Wash thoroughly, paying special attention to the area where your    surgery  will be performed.  7.  Thoroughly rinse your body with warm water from the neck down.  8.   DO NOT shower/wash with your normal soap after using and rinsing off the CHG Soap.                9.  Pat yourself dry with a clean towel.            10.  Wear clean pajamas.            11.  Place clean sheets on your bed the night of your first shower and do not  sleep with pets. Day of Surgery : Do not apply any lotions/deodorants the morning of surgery.  Please wear clean clothes to the hospital/surgery center.  FAILURE TO FOLLOW THESE INSTRUCTIONS MAY RESULT IN THE CANCELLATION OF YOUR SURGERY  PATIENT SIGNATURE_________________________________  NURSE SIGNATURE__________________________________  ________________________________________________________________________

## 2020-11-25 ENCOUNTER — Encounter (HOSPITAL_COMMUNITY): Payer: Self-pay

## 2020-11-25 ENCOUNTER — Other Ambulatory Visit: Payer: Self-pay

## 2020-11-25 ENCOUNTER — Encounter (HOSPITAL_COMMUNITY)
Admission: RE | Admit: 2020-11-25 | Discharge: 2020-11-25 | Disposition: A | Discharge status: Home / Self Care | Payer: Medicare Other | Source: Ambulatory Visit | Attending: Surgery | Admitting: Surgery

## 2020-11-25 DIAGNOSIS — Z01812 Encounter for preprocedural laboratory examination: Secondary | ICD-10-CM | POA: Diagnosis not present

## 2020-11-25 LAB — CBC
HCT: 41.2 % (ref 36.0–46.0)
Hemoglobin: 13.8 g/dL (ref 12.0–15.0)
MCH: 32.9 pg (ref 26.0–34.0)
MCHC: 33.5 g/dL (ref 30.0–36.0)
MCV: 98.3 fL (ref 80.0–100.0)
Platelets: 177 10*3/uL (ref 150–400)
RBC: 4.19 MIL/uL (ref 3.87–5.11)
RDW: 12.5 % (ref 11.5–15.5)
WBC: 6.2 10*3/uL (ref 4.0–10.5)
nRBC: 0 % (ref 0.0–0.2)

## 2020-11-25 LAB — BASIC METABOLIC PANEL
Anion gap: 9 (ref 5–15)
BUN: 16 mg/dL (ref 8–23)
CO2: 25 mmol/L (ref 22–32)
Calcium: 10 mg/dL (ref 8.9–10.3)
Chloride: 107 mmol/L (ref 98–111)
Creatinine, Ser: 0.89 mg/dL (ref 0.44–1.00)
GFR, Estimated: >60 mL/min (ref >60)
Glucose, Bld: 98 mg/dL (ref 70–99)
Potassium: 4.1 mmol/L (ref 3.5–5.1)
Sodium: 141 mmol/L (ref 135–145)

## 2020-11-26 LAB — HEMOGLOBIN A1C
Hgb A1c MFr Bld: 6.4 % — ABNORMAL HIGH (ref 4.8–5.6)
Mean Plasma Glucose: 137 mg/dL

## 2020-12-02 NOTE — Progress Notes (Signed)
Patient had some questions regarding medications prior to surgery. She was told to stop fish oil 1 week before as well as questran and dicyclomine 5 days before. Confirmed with patient that this is correct

## 2020-12-08 ENCOUNTER — Other Ambulatory Visit: Payer: Self-pay | Admitting: Surgery

## 2020-12-08 LAB — SARS CORONAVIRUS 2 (TAT 6-24 HRS): SARS Coronavirus 2: NEGATIVE

## 2020-12-09 ENCOUNTER — Emergency Department (HOSPITAL_COMMUNITY): Payer: Medicare Other

## 2020-12-09 ENCOUNTER — Inpatient Hospital Stay (HOSPITAL_COMMUNITY)
Admission: EM | Admit: 2020-12-09 | Discharge: 2020-12-16 | DRG: 331 | Disposition: A | Discharge status: Home Health | Payer: Medicare Other | Attending: Surgery | Admitting: Surgery

## 2020-12-09 ENCOUNTER — Encounter (HOSPITAL_COMMUNITY): Payer: Self-pay | Admitting: Oncology

## 2020-12-09 ENCOUNTER — Other Ambulatory Visit: Payer: Self-pay

## 2020-12-09 DIAGNOSIS — H532 Diplopia: Secondary | ICD-10-CM | POA: Diagnosis not present

## 2020-12-09 DIAGNOSIS — K760 Fatty (change of) liver, not elsewhere classified: Secondary | ICD-10-CM | POA: Diagnosis not present

## 2020-12-09 DIAGNOSIS — N182 Chronic kidney disease, stage 2 (mild): Secondary | ICD-10-CM | POA: Diagnosis not present

## 2020-12-09 DIAGNOSIS — Z803 Family history of malignant neoplasm of breast: Secondary | ICD-10-CM

## 2020-12-09 DIAGNOSIS — Z91013 Allergy to seafood: Secondary | ICD-10-CM

## 2020-12-09 DIAGNOSIS — Z820 Family history of epilepsy and other diseases of the nervous system: Secondary | ICD-10-CM

## 2020-12-09 DIAGNOSIS — Z8711 Personal history of peptic ulcer disease: Secondary | ICD-10-CM | POA: Diagnosis not present

## 2020-12-09 DIAGNOSIS — R1084 Generalized abdominal pain: Secondary | ICD-10-CM | POA: Diagnosis not present

## 2020-12-09 DIAGNOSIS — Z807 Family history of other malignant neoplasms of lymphoid, hematopoietic and related tissues: Secondary | ICD-10-CM | POA: Diagnosis not present

## 2020-12-09 DIAGNOSIS — K589 Irritable bowel syndrome without diarrhea: Secondary | ICD-10-CM | POA: Diagnosis present

## 2020-12-09 DIAGNOSIS — Z79899 Other long term (current) drug therapy: Secondary | ICD-10-CM | POA: Diagnosis not present

## 2020-12-09 DIAGNOSIS — F419 Anxiety disorder, unspecified: Secondary | ICD-10-CM | POA: Diagnosis present

## 2020-12-09 DIAGNOSIS — R197 Diarrhea, unspecified: Secondary | ICD-10-CM | POA: Diagnosis not present

## 2020-12-09 DIAGNOSIS — C180 Malignant neoplasm of cecum: Principal | ICD-10-CM | POA: Diagnosis present

## 2020-12-09 DIAGNOSIS — Z20822 Contact with and (suspected) exposure to covid-19: Secondary | ICD-10-CM | POA: Diagnosis not present

## 2020-12-09 DIAGNOSIS — Z833 Family history of diabetes mellitus: Secondary | ICD-10-CM | POA: Diagnosis not present

## 2020-12-09 DIAGNOSIS — Z9012 Acquired absence of left breast and nipple: Secondary | ICD-10-CM | POA: Diagnosis not present

## 2020-12-09 DIAGNOSIS — M47812 Spondylosis without myelopathy or radiculopathy, cervical region: Secondary | ICD-10-CM | POA: Diagnosis not present

## 2020-12-09 DIAGNOSIS — I129 Hypertensive chronic kidney disease with stage 1 through stage 4 chronic kidney disease, or unspecified chronic kidney disease: Secondary | ICD-10-CM | POA: Diagnosis present

## 2020-12-09 DIAGNOSIS — Z881 Allergy status to other antibiotic agents status: Secondary | ICD-10-CM

## 2020-12-09 DIAGNOSIS — R61 Generalized hyperhidrosis: Secondary | ICD-10-CM | POA: Diagnosis not present

## 2020-12-09 DIAGNOSIS — I10 Essential (primary) hypertension: Secondary | ICD-10-CM | POA: Diagnosis present

## 2020-12-09 DIAGNOSIS — Z83438 Family history of other disorder of lipoprotein metabolism and other lipidemia: Secondary | ICD-10-CM | POA: Diagnosis not present

## 2020-12-09 DIAGNOSIS — M4312 Spondylolisthesis, cervical region: Secondary | ICD-10-CM | POA: Diagnosis not present

## 2020-12-09 DIAGNOSIS — R55 Syncope and collapse: Secondary | ICD-10-CM

## 2020-12-09 DIAGNOSIS — Z853 Personal history of malignant neoplasm of breast: Secondary | ICD-10-CM | POA: Diagnosis not present

## 2020-12-09 DIAGNOSIS — E785 Hyperlipidemia, unspecified: Secondary | ICD-10-CM | POA: Diagnosis not present

## 2020-12-09 DIAGNOSIS — J302 Other seasonal allergic rhinitis: Secondary | ICD-10-CM | POA: Diagnosis present

## 2020-12-09 DIAGNOSIS — I959 Hypotension, unspecified: Secondary | ICD-10-CM | POA: Diagnosis not present

## 2020-12-09 DIAGNOSIS — Z9049 Acquired absence of other specified parts of digestive tract: Secondary | ICD-10-CM | POA: Diagnosis not present

## 2020-12-09 DIAGNOSIS — J9811 Atelectasis: Secondary | ICD-10-CM | POA: Diagnosis not present

## 2020-12-09 DIAGNOSIS — J449 Chronic obstructive pulmonary disease, unspecified: Secondary | ICD-10-CM | POA: Diagnosis not present

## 2020-12-09 DIAGNOSIS — J45909 Unspecified asthma, uncomplicated: Secondary | ICD-10-CM | POA: Diagnosis present

## 2020-12-09 DIAGNOSIS — K6389 Other specified diseases of intestine: Secondary | ICD-10-CM | POA: Diagnosis not present

## 2020-12-09 DIAGNOSIS — M1990 Unspecified osteoarthritis, unspecified site: Secondary | ICD-10-CM | POA: Diagnosis present

## 2020-12-09 DIAGNOSIS — R42 Dizziness and giddiness: Secondary | ICD-10-CM | POA: Diagnosis not present

## 2020-12-09 DIAGNOSIS — K219 Gastro-esophageal reflux disease without esophagitis: Secondary | ICD-10-CM | POA: Diagnosis not present

## 2020-12-09 DIAGNOSIS — K580 Irritable bowel syndrome with diarrhea: Secondary | ICD-10-CM | POA: Diagnosis not present

## 2020-12-09 DIAGNOSIS — Z886 Allergy status to analgesic agent status: Secondary | ICD-10-CM

## 2020-12-09 DIAGNOSIS — D122 Benign neoplasm of ascending colon: Secondary | ICD-10-CM | POA: Diagnosis not present

## 2020-12-09 DIAGNOSIS — Z8249 Family history of ischemic heart disease and other diseases of the circulatory system: Secondary | ICD-10-CM | POA: Diagnosis not present

## 2020-12-09 DIAGNOSIS — Z23 Encounter for immunization: Secondary | ICD-10-CM

## 2020-12-09 DIAGNOSIS — E669 Obesity, unspecified: Secondary | ICD-10-CM | POA: Diagnosis present

## 2020-12-09 DIAGNOSIS — Z888 Allergy status to other drugs, medicaments and biological substances status: Secondary | ICD-10-CM | POA: Diagnosis not present

## 2020-12-09 LAB — CBC WITH DIFFERENTIAL/PLATELET
Abs Immature Granulocytes: 0.03 10*3/uL (ref 0.00–0.07)
Basophils Absolute: 0 10*3/uL (ref 0.0–0.1)
Basophils Relative: 0 %
Eosinophils Absolute: 0 10*3/uL (ref 0.0–0.5)
Eosinophils Relative: 0 %
HCT: 40.2 % (ref 36.0–46.0)
Hemoglobin: 13.4 g/dL (ref 12.0–15.0)
Immature Granulocytes: 0 %
Lymphocytes Relative: 14 %
Lymphs Abs: 1.3 10*3/uL (ref 0.7–4.0)
MCH: 32.8 pg (ref 26.0–34.0)
MCHC: 33.3 g/dL (ref 30.0–36.0)
MCV: 98.5 fL (ref 80.0–100.0)
Monocytes Absolute: 0.7 10*3/uL (ref 0.1–1.0)
Monocytes Relative: 7 %
Neutro Abs: 7.1 10*3/uL (ref 1.7–7.7)
Neutrophils Relative %: 79 %
Platelets: 168 10*3/uL (ref 150–400)
RBC: 4.08 MIL/uL (ref 3.87–5.11)
RDW: 12.7 % (ref 11.5–15.5)
WBC: 9.1 10*3/uL (ref 4.0–10.5)
nRBC: 0 % (ref 0.0–0.2)

## 2020-12-09 LAB — COMPREHENSIVE METABOLIC PANEL
ALT: 35 U/L (ref 0–44)
AST: 40 U/L (ref 15–41)
Albumin: 4.7 g/dL (ref 3.5–5.0)
Alkaline Phosphatase: 65 U/L (ref 38–126)
Anion gap: 11 (ref 5–15)
BUN: 17 mg/dL (ref 8–23)
CO2: 19 mmol/L — ABNORMAL LOW (ref 22–32)
Calcium: 9.7 mg/dL (ref 8.9–10.3)
Chloride: 103 mmol/L (ref 98–111)
Creatinine, Ser: 1.28 mg/dL — ABNORMAL HIGH (ref 0.44–1.00)
GFR, Estimated: 44 mL/min — ABNORMAL LOW (ref >60)
Glucose, Bld: 134 mg/dL — ABNORMAL HIGH (ref 70–99)
Potassium: 3.7 mmol/L (ref 3.5–5.1)
Sodium: 133 mmol/L — ABNORMAL LOW (ref 135–145)
Total Bilirubin: 1.2 mg/dL (ref 0.3–1.2)
Total Protein: 7.5 g/dL (ref 6.5–8.1)

## 2020-12-09 LAB — RESP PANEL BY RT-PCR (FLU A&B, COVID) ARPGX2
Influenza A by PCR: NEGATIVE
Influenza B by PCR: NEGATIVE
SARS Coronavirus 2 by RT PCR: NEGATIVE

## 2020-12-09 MED ORDER — ENSURE PRE-SURGERY PO LIQD
296.0000 mL | Freq: Once | ORAL | Status: AC
  Administered 2020-12-09: 296 mL via ORAL
  Filled 2020-12-09: qty 296

## 2020-12-09 MED ORDER — POTASSIUM CHLORIDE IN NACL 20-0.9 MEQ/L-% IV SOLN
INTRAVENOUS | Status: DC
  Filled 2020-12-09 (×2): qty 1000

## 2020-12-09 MED ORDER — ALBUTEROL SULFATE (2.5 MG/3ML) 0.083% IN NEBU: 3.0000 mL | INHALATION_SOLUTION | Freq: Four times a day (QID) | RESPIRATORY_TRACT | Status: DC | PRN

## 2020-12-09 MED ORDER — INFLUENZA VAC A&B SA ADJ QUAD 0.5 ML IM PRSY
0.5000 mL | PREFILLED_SYRINGE | INTRAMUSCULAR | Status: AC
  Administered 2020-12-16: 0.5 mL via INTRAMUSCULAR
  Filled 2020-12-09 (×2): qty 0.5

## 2020-12-09 MED ORDER — METRONIDAZOLE 500 MG PO TABS
500.0000 mg | ORAL_TABLET | Freq: Once | ORAL | Status: AC
  Administered 2020-12-10: 500 mg via ORAL
  Filled 2020-12-09: qty 1

## 2020-12-09 MED ORDER — LISINOPRIL 20 MG PO TABS
20.0000 mg | ORAL_TABLET | Freq: Every day | ORAL | Status: DC
  Administered 2020-12-10 – 2020-12-12 (×4): 20 mg via ORAL
  Filled 2020-12-09 (×5): qty 1

## 2020-12-09 MED ORDER — NEOMYCIN SULFATE 500 MG PO TABS
1000.0000 mg | ORAL_TABLET | Freq: Once | ORAL | Status: AC
  Administered 2020-12-10: 1000 mg via ORAL
  Filled 2020-12-09 (×2): qty 2

## 2020-12-09 MED ORDER — ACETAMINOPHEN 500 MG PO TABS: 1000.0000 mg | ORAL_TABLET | Freq: Four times a day (QID) | ORAL | Status: DC | PRN

## 2020-12-09 MED ORDER — ENOXAPARIN SODIUM 40 MG/0.4ML IJ SOSY
40.0000 mg | PREFILLED_SYRINGE | INTRAMUSCULAR | Status: DC
  Administered 2020-12-10: 40 mg via SUBCUTANEOUS
  Filled 2020-12-09: qty 0.4

## 2020-12-09 NOTE — ED Provider Notes (Signed)
[facility] DEPT Provider Note   CSN: [id number] Arrival date & time: 12/09/20  1733     History No chief complaint on file.   Colleen Hancock is a 75 y.o. female.  Patient presents with chief complaint of syncope.  Patient states that she was scheduled to have a colectomy tomorrow.  She was instructed to do complete her bowel regimen today and has been taking GoLytely throughout the day.  She was on the toilet having diarrhea when the next and she knew she was waking up from the ground.  She states that she passed out.  Denies any headache or chest pain or abdominal pain.  Denies any pain or discomfort at this time.  Otherwise no reports of fevers or cough or vomiting.  Patient states that she completed her oral GI prep.      Past Medical History:  Diagnosis Date   Allergy    Anxiety 09/14/2020   Arthritis    hands, knees   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   Breast CA ([place]) 10/2012   Left Breast   Complication of anesthesia    during colonoscopy fentanyl dropped blood pressure 2013 or 2014   Diverticulitis 09/14/2020   no meds currently   Dyspnea 09/14/2020   on exertion can climb flight of steps   Fatty liver 09/14/2020   GERD (gastroesophageal reflux disease)    Heart murmur 09/14/2020   mild no cardiologist   High cholesterol 09/14/2020   History of migraine    none since 1990's   Hypertension 09/14/2020   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   with diarrhea   Pneumonia    yrs ago per pt on 09-14-2020   Swelling of joint of left shoulder 09/14/2020   swelling under left shoulder, 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   Thickened endometrium 09/14/2020   Wears glasses 09/14/2020    Patient Active Problem List   Diagnosis Date Noted   Uterine polyp 09/18/2020   Ingrown toenail 04/12/2020   Breast neoplasm, Tis (DCIS), 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   Asthma 01/26/2016   PNA (pneumonia) 04/11/2014    Hyperlipemia 04/11/2014   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    Past Surgical History:  Procedure Laterality Date   BREAST BIOPSY Left 10/2012   Stereo- Malignant   BREAST BIOPSY Left    U/S Core- Benign   BREAST EXCISIONAL BIOPSY Left    BREAST EXCISIONAL BIOPSY Left    BREAST LUMPECTOMY Left 11/2012   radiation tx done x 34 treatments   [place]  2010   laparoscopic   COLONOSCOPY     2013 or 2014   [place]   HYSTEROSCOPY WITH D & C N/A 09/18/2020   Procedure: DILATATION AND CURETTAGE /HYSTEROSCOPY/ POLYPECTOMY;  Surgeon: Janyth Contes, MD;  Location: [facility];  Service: Gynecology;  Laterality: N/A;   MASTECTOMY Left 2018   MASTECTOMY W/ SENTINEL NODE BIOPSY Left 08/18/2016   MASTECTOMY W/ SENTINEL NODE BIOPSY Left 08/18/2016   Procedure: LEFT TOTAL MASTECTOMY WITH SENTINEL LYMPH NODE BIOPSY WITH BLUE DYE INJECTION;  Surgeon: Rolm Bookbinder, MD;  Location: [facility];  Service: General;  Laterality: Left;   SKIN CANCER EXCISION Left 2009   eye brow     OB History   No obstetric history on file.     Family History  Problem Relation Age of Onset   Alzheimer's  disease Mother    Heart attack Father    Heart disease Father    Multiple sclerosis Brother    Epilepsy Brother    Alzheimer's disease Brother    Hypertension Brother    Heart disease Brother    Hyperlipidemia Brother    Kidney Stones Brother    Breast cancer Maternal Aunt    Hodgkin's lymphoma Son    Heart disease Son    Diabetes Son    Breast cancer Cousin    Breast cancer Cousin    Breast cancer Cousin    Heart disease Other    Colon polyps Neg Hx    Colon cancer Neg Hx    Esophageal cancer Neg Hx    Rectal cancer Neg Hx    Stomach cancer Neg Hx     Social History   Tobacco Use   Smoking status: Never   Smokeless tobacco: Never  Vaping Use   Vaping Use: Never used  Substance Use  Topics   Alcohol use: Yes    Comment: 1 glass wine at new years only   Drug use: No    Home Medications Prior to Admission medications   Medication Sig Start Date End Date Taking? Authorizing Provider  albuterol (PROVENTIL HFA;VENTOLIN HFA) 108 (90 BASE) MCG/ACT inhaler Inhale 1 puff into the lungs every 6 (six) hours as needed for wheezing or shortness of breath (Asthma).    [provider]  atorvastatin (LIPITOR) 20 MG tablet Take 20 mg by mouth daily before breakfast. 06/22/16   [provider]  cetirizine (ZYRTEC ALLERGY) 10 MG tablet Take 1 tablet (10 mg total) by mouth daily. Patient taking differently: Take 10 mg by mouth every evening. 08/02/16   Nicholas Lose, MD  cholestyramine Lucrezia Starch) 4 GM/DOSE powder Take 0.75 packets by mouth daily after breakfast. 3/4 scoop (MUST TAKE 1 hour after dicyclomine) 05/24/16   [provider]  dicyclomine (BENTYL) 10 MG capsule Take 10 mg by mouth daily at 6 (six) AM. (1st thing in the morning)    [provider]  fluticasone (FLONASE) 50 MCG/ACT nasal spray Place 1 spray into both nostrils daily.    [provider]  lisinopril (ZESTRIL) 20 MG tablet Take 20 mg by mouth at bedtime.    [provider]  Multiple Vitamins-Minerals (MULTIVITAMIN GUMMIES WOMENS PO) Take 1 tablet by mouth every evening. One-A-Day Women's  pill    [provider]  Omega-3 Fatty Acids (FISH OIL) 1200 MG CAPS Take 1,200 mg by mouth every evening.    [provider]  Polyethyl Glycol-Propyl Glycol (SYSTANE) 0.4-0.3 % SOLN Place 1 drop into both eyes daily.    [provider]  traMADol (ULTRAM) 50 MG tablet Take 1 tablet (50 mg total) by mouth every 6 (six) hours as needed for moderate pain (for pain.). 09/18/20   Bovard-Stuckert, Jeral Fruit, MD    Allergies    Fentanyl, Tetracyclines & related, Other, Sodium lactate, Clams [shellfish allergy], Nsaids, and Pentazocine lactate  Review of Systems   Review  of Systems  Constitutional:  Negative for fever.  HENT:  Negative for ear pain.   Eyes:  Negative for pain.  Respiratory:  Negative for cough.   Cardiovascular:  Negative for chest pain.  Gastrointestinal:  Negative for abdominal pain.  Genitourinary:  Negative for flank pain.  Musculoskeletal:  Negative for back pain.  Skin:  Negative for rash.  Neurological:  Negative for headaches.   Physical Exam Updated Vital Signs BP (!) 134/56  Pulse 77   Temp 98.2 F (36.8 C) (Oral)   Resp 18   Ht 5' 3.75" (1.619 m)   Wt 91.6 kg   SpO2 92%   BMI 34.95 kg/m   Physical Exam Constitutional:      General: She is not in acute distress.    Appearance: Normal appearance.  HENT:     Head: Normocephalic.     Nose: Nose normal.  Eyes:     Extraocular Movements: Extraocular movements intact.  Cardiovascular:     Rate and Rhythm: Normal rate.  Pulmonary:     Effort: Pulmonary effort is normal.  Abdominal:     Tenderness: There is no abdominal tenderness. There is no guarding or rebound.  Musculoskeletal:        General: Normal range of motion.     Cervical back: Normal range of motion.  Neurological:     General: No focal deficit present.     Mental Status: She is alert. Mental status is at baseline.    ED Results / Procedures / Treatments   Labs (all labs ordered are listed, but only abnormal results are displayed) Labs Reviewed  COMPREHENSIVE METABOLIC PANEL - Abnormal; Notable for the following components:      Result Value   Sodium 133 (*)    CO2 19 (*)    Glucose, Bld 134 (*)    Creatinine, Ser 1.28 (*)    GFR, Estimated 44 (*)    All other components within normal limits  RESP PANEL BY RT-PCR (FLU A&B, COVID) ARPGX2  CBC WITH DIFFERENTIAL/PLATELET    EKG None  Radiology CT Head Wo Contrast  Result Date: 12/09/2020 CLINICAL DATA:  Syncopal episode. EXAM: CT HEAD WITHOUT CONTRAST CT CERVICAL SPINE WITHOUT CONTRAST TECHNIQUE: Multidetector CT imaging of the head  and cervical spine was performed following the standard protocol without intravenous contrast. Multiplanar CT image reconstructions of the cervical spine were also generated. COMPARISON:  CT chest 11/05/2020 FINDINGS: CT HEAD FINDINGS BRAIN: BRAIN Cerebral ventricle sizes are concordant with the degree of cerebral volume loss. Patchy and confluent areas of decreased attenuation are noted throughout the deep and periventricular white matter of the cerebral hemispheres bilaterally, compatible with chronic microvascular ischemic disease. No evidence of large-territorial acute infarction. No parenchymal hemorrhage. No mass lesion. No extra-axial collection. No mass effect or midline shift. No hydrocephalus. Basilar cisterns are patent. Vascular: No hyperdense vessel. Skull: No acute fracture or focal lesion. Sinuses/Orbits: Paranasal sinuses and mastoid air cells are clear. The orbits are unremarkable. Other: None. CT CERVICAL SPINE FINDINGS Alignment: Straightening of normal cervical lordosis likely due to positioning and degenerative changes. Grade 1 anterolisthesis of due foreign C5. Skull base and vertebrae: Multilevel at least moderate degenerative changes of the spine most prominent at the C6-C7 levels. No acute fracture. No aggressive appearing focal osseous lesion or focal pathologic process. Soft tissues and spinal canal: No prevertebral fluid or swelling. No visible canal hematoma. Upper chest: Similar-appearing right apical scarring. Other: Partially retropharyngeal right common carotid artery. IMPRESSION: 1. No acute intracranial abnormality. 2. No acute displaced fracture or traumatic listhesis of the cervical spine. Electronically Signed   By: Iven Finn M.D.   On: 12/09/2020 19:54   CT Cervical Spine Wo Contrast  Result Date: 12/09/2020 CLINICAL DATA:  Syncopal episode. EXAM: CT HEAD WITHOUT CONTRAST CT CERVICAL SPINE WITHOUT CONTRAST TECHNIQUE: Multidetector CT imaging of the head and cervical  spine was performed following the standard protocol without intravenous contrast. Multiplanar CT image reconstructions of  the cervical spine were also generated. COMPARISON:  CT chest 11/05/2020 FINDINGS: CT HEAD FINDINGS BRAIN: BRAIN Cerebral ventricle sizes are concordant with the degree of cerebral volume loss. Patchy and confluent areas of decreased attenuation are noted throughout the deep and periventricular white matter of the cerebral hemispheres bilaterally, compatible with chronic microvascular ischemic disease. No evidence of large-territorial acute infarction. No parenchymal hemorrhage. No mass lesion. No extra-axial collection. No mass effect or midline shift. No hydrocephalus. Basilar cisterns are patent. Vascular: No hyperdense vessel. Skull: No acute fracture or focal lesion. Sinuses/Orbits: Paranasal sinuses and mastoid air cells are clear. The orbits are unremarkable. Other: None. CT CERVICAL SPINE FINDINGS Alignment: Straightening of normal cervical lordosis likely due to positioning and degenerative changes. Grade 1 anterolisthesis of due foreign C5. Skull base and vertebrae: Multilevel at least moderate degenerative changes of the spine most prominent at the C6-C7 levels. No acute fracture. No aggressive appearing focal osseous lesion or focal pathologic process. Soft tissues and spinal canal: No prevertebral fluid or swelling. No visible canal hematoma. Upper chest: Similar-appearing right apical scarring. Other: Partially retropharyngeal right common carotid artery. IMPRESSION: 1. No acute intracranial abnormality. 2. No acute displaced fracture or traumatic listhesis of the cervical spine. Electronically Signed   By: Iven Finn M.D.   On: 12/09/2020 19:54    Procedures Procedures   Medications Ordered in ED Medications - No data to display  ED Course  I have reviewed the triage vital signs and the nursing notes.  Pertinent labs & imaging results that were available during my  care of the patient were reviewed by me and considered in my medical decision making (see chart for details).    MDM Rules/Calculators/A&P                           Labs unremarkable chemistry shows mild AKI with creatinine 1.28, CBC normal.  CT imaging of the brain and C-spine unremarkable for any acute findings.  Patient without any symptoms of pain or discomfort at this time.  Case discussed with surgery Dr.Blackman, who agrees to see the patient and keep her in the hospital overnight for her operative intervention tomorrow.  Final Clinical Impression(s) / ED Diagnoses Final diagnoses:  Syncope, unspecified syncope type    Rx / DC Orders ED Discharge Orders     None        Luna Fuse, MD 12/09/20 2024

## 2020-12-09 NOTE — H&P (View-Only) (Signed)
Patient ID: Colleen Hancock, female   DOB: November 26, 1945, 75 y.o.   MRN: [id number]  Patient had syncopal episode after finishing bowel prep for surgery tomorrow morning. Workup negative. Vitals stable.  As her case is 7:30 tomorrow morning will go ahead a place the patient in the hospital for IV fluids.  I notified Dr. Johney Maine and the OR.  Surgery will remain as scheduled.  OK to release Dr. Clyda Greener preop orders when necessary.

## 2020-12-09 NOTE — ED Provider Notes (Signed)
Emergency Medicine Provider Triage Evaluation Note  [facility] [place] , a 75 y.o. female  was evaluated in triage.  Pt complains of a syncopal episode that occurred prior to arrival.  Patient has a history of colon cancer and is scheduled to have a partial colectomy tomorrow.  She started her bowel prep this morning and was having persistent uncontrollable diarrhea.  Her son was helping her on the toilet this afternoon and she lost consciousness for about 3 to 4 minutes.  Son denied falls or trauma.  Upon EMS arrival they were unable to obtain orthostatic vital signs but patient was noted to be extremely lightheaded when standing.  She was given 500 cc of IV fluids.  Systolic BP greater than 295 mmHg.  CBG of 117.  Physical Exam  There were no vitals taken for this visit. Gen:   Awake, no distress   Resp:  Normal effort  MSK:   Moves extremities without difficulty  Other:    Medical Decision Making  Medically screening exam initiated at 5:51 PM.  Appropriate orders placed.  [facility] [place] was informed that the remainder of the evaluation will be completed by another provider, this initial triage assessment does not replace that evaluation, and the importance of remaining in the ED until their evaluation is complete.   Rayna Sexton, PA-C 12/09/20 1752    Luna Fuse, MD 12/09/20 2022

## 2020-12-09 NOTE — Progress Notes (Signed)
Patient ID: Colleen Hancock, female   DOB: [birth year], 75 y.o.   MRN: [id number]  Patient had syncopal episode after finishing bowel prep for surgery tomorrow morning. Workup negative. Vitals stable.  As her case is 7:30 tomorrow morning will go ahead a place the patient in the hospital for IV fluids.  I notified Dr. Johney Maine and the OR.  Surgery will remain as scheduled.  OK to release Dr. Clyda Greener preop orders when necessary.

## 2020-12-09 NOTE — ED Triage Notes (Addendum)
Per EMS-states she is suppose to have surgery tomorrow to have part of her colon removed-MD told her to due a cleanse-patient drank miralax and Gatorade-64 oz over 4 hours-having uncontrollable diarrhea-syncopal episode on the toilet-500 cc bolus given in route-20 g left [facility]

## 2020-12-10 ENCOUNTER — Inpatient Hospital Stay (HOSPITAL_COMMUNITY): Payer: Medicare Other | Admitting: Registered Nurse

## 2020-12-10 ENCOUNTER — Inpatient Hospital Stay (HOSPITAL_COMMUNITY): Admission: RE | Admit: 2020-12-10 | Payer: Medicare Other | Source: Ambulatory Visit | Admitting: Surgery

## 2020-12-10 ENCOUNTER — Encounter (HOSPITAL_COMMUNITY): Payer: Self-pay | Admitting: Surgery

## 2020-12-10 ENCOUNTER — Encounter (HOSPITAL_COMMUNITY)
Admission: EM | Disposition: A | Discharge status: Home Health | Payer: Self-pay | Source: Home / Self Care | Attending: Surgery

## 2020-12-10 DIAGNOSIS — C180 Malignant neoplasm of cecum: Secondary | ICD-10-CM

## 2020-12-10 HISTORY — PX: COLON RESECTION: SHX5231

## 2020-12-10 HISTORY — PX: APPENDECTOMY: SHX54

## 2020-12-10 LAB — ABO/RH: ABO/RH(D): A POS

## 2020-12-10 LAB — SURGICAL PCR SCREEN
MRSA, PCR: NEGATIVE
Staphylococcus aureus: NEGATIVE

## 2020-12-10 LAB — CREATININE, SERUM
Creatinine, Ser: 1.22 mg/dL — ABNORMAL HIGH (ref 0.44–1.00)
GFR, Estimated: 46 mL/min — ABNORMAL LOW (ref >60)

## 2020-12-10 LAB — TYPE AND SCREEN
ABO/RH(D): A POS
Antibody Screen: NEGATIVE

## 2020-12-10 SURGERY — COLECTOMY, SIGMOID, ROBOT-ASSISTED
Anesthesia: General | Site: Abdomen

## 2020-12-10 MED ORDER — PROCHLORPERAZINE MALEATE 10 MG PO TABS
10.0000 mg | ORAL_TABLET | Freq: Four times a day (QID) | ORAL | Status: DC | PRN
  Filled 2020-12-10: qty 1

## 2020-12-10 MED ORDER — MUPIROCIN 2 % EX OINT
1.0000 "application " | TOPICAL_OINTMENT | Freq: Two times a day (BID) | CUTANEOUS | Status: AC
  Administered 2020-12-11 – 2020-12-14 (×7): 1 via NASAL
  Filled 2020-12-10 (×2): qty 22

## 2020-12-10 MED ORDER — ONDANSETRON HCL 4 MG PO TABS: 4.0000 mg | ORAL_TABLET | Freq: Four times a day (QID) | ORAL | Status: DC | PRN

## 2020-12-10 MED ORDER — LIDOCAINE HCL 2 % IJ SOLN
INTRAMUSCULAR | Status: AC
  Filled 2020-12-10: qty 20

## 2020-12-10 MED ORDER — MELATONIN 3 MG PO TABS: 3.0000 mg | ORAL_TABLET | Freq: Every evening | ORAL | Status: DC | PRN

## 2020-12-10 MED ORDER — BUPIVACAINE-EPINEPHRINE (PF) 0.25% -1:200000 IJ SOLN
INTRAMUSCULAR | Status: AC
  Filled 2020-12-10: qty 60

## 2020-12-10 MED ORDER — NEOMYCIN SULFATE 500 MG PO TABS: 1000.0000 mg | ORAL_TABLET | ORAL | Status: DC

## 2020-12-10 MED ORDER — FENTANYL CITRATE PF 50 MCG/ML IJ SOSY: 25.0000 ug | PREFILLED_SYRINGE | INTRAMUSCULAR | Status: DC | PRN

## 2020-12-10 MED ORDER — LACTATED RINGERS IV SOLN: INTRAVENOUS | Status: DC

## 2020-12-10 MED ORDER — DEXAMETHASONE SODIUM PHOSPHATE 10 MG/ML IJ SOLN
INTRAMUSCULAR | Status: DC | PRN
  Administered 2020-12-10: 8 mg via INTRAVENOUS

## 2020-12-10 MED ORDER — BISMUTH SUBSALICYLATE 262 MG/15ML PO SUSP
30.0000 mL | Freq: Three times a day (TID) | ORAL | Status: DC | PRN
  Filled 2020-12-10: qty 236

## 2020-12-10 MED ORDER — HYDROMORPHONE HCL 1 MG/ML IJ SOLN: 0.5000 mg | INTRAMUSCULAR | Status: DC | PRN

## 2020-12-10 MED ORDER — CHOLESTYRAMINE 4 G PO PACK
4.0000 g | PACK | Freq: Every day | ORAL | Status: DC
  Administered 2020-12-10 – 2020-12-13 (×4): 4 g via ORAL
  Filled 2020-12-10 (×5): qty 1

## 2020-12-10 MED ORDER — 0.9 % SODIUM CHLORIDE (POUR BTL) OPTIME
TOPICAL | Status: DC | PRN
  Administered 2020-12-10: 2000 mL

## 2020-12-10 MED ORDER — SODIUM CHLORIDE 0.9 % IV SOLN
2.0000 g | Freq: Two times a day (BID) | INTRAVENOUS | Status: AC
  Administered 2020-12-10: 2 g via INTRAVENOUS
  Filled 2020-12-10: qty 2

## 2020-12-10 MED ORDER — METRONIDAZOLE 500 MG PO TABS: 1000.0000 mg | ORAL_TABLET | ORAL | Status: DC

## 2020-12-10 MED ORDER — PROPOFOL 10 MG/ML IV BOLUS
INTRAVENOUS | Status: AC
  Filled 2020-12-10: qty 20

## 2020-12-10 MED ORDER — LACTATED RINGERS IR SOLN
Status: DC | PRN
  Administered 2020-12-10: 1000 mL

## 2020-12-10 MED ORDER — ENSURE PRE-SURGERY PO LIQD
296.0000 mL | Freq: Once | ORAL | Status: DC
  Filled 2020-12-10: qty 296

## 2020-12-10 MED ORDER — DIPHENHYDRAMINE HCL 50 MG/ML IJ SOLN: 12.5000 mg | Freq: Four times a day (QID) | INTRAMUSCULAR | Status: DC | PRN

## 2020-12-10 MED ORDER — ALVIMOPAN 12 MG PO CAPS
12.0000 mg | ORAL_CAPSULE | ORAL | Status: AC
  Administered 2020-12-10: 12 mg via ORAL
  Filled 2020-12-10: qty 1

## 2020-12-10 MED ORDER — CALCIUM POLYCARBOPHIL 625 MG PO TABS
625.0000 mg | ORAL_TABLET | Freq: Two times a day (BID) | ORAL | Status: DC
  Administered 2020-12-10 – 2020-12-16 (×12): 625 mg via ORAL
  Filled 2020-12-10 (×12): qty 1

## 2020-12-10 MED ORDER — ROCURONIUM BROMIDE 10 MG/ML (PF) SYRINGE
PREFILLED_SYRINGE | INTRAVENOUS | Status: DC | PRN
  Administered 2020-12-10: 80 mg via INTRAVENOUS

## 2020-12-10 MED ORDER — SUGAMMADEX SODIUM 200 MG/2ML IV SOLN
INTRAVENOUS | Status: DC | PRN
  Administered 2020-12-10: 150 mg via INTRAVENOUS

## 2020-12-10 MED ORDER — ATORVASTATIN CALCIUM 20 MG PO TABS
20.0000 mg | ORAL_TABLET | Freq: Every day | ORAL | Status: DC
  Administered 2020-12-11 – 2020-12-16 (×6): 20 mg via ORAL
  Filled 2020-12-10 (×6): qty 1

## 2020-12-10 MED ORDER — PROPOFOL 10 MG/ML IV BOLUS
INTRAVENOUS | Status: DC | PRN
  Administered 2020-12-10: 150 mg via INTRAVENOUS

## 2020-12-10 MED ORDER — POLYETHYLENE GLYCOL 3350 17 GM/SCOOP PO POWD: 1.0000 | Freq: Once | ORAL | Status: DC

## 2020-12-10 MED ORDER — FENTANYL CITRATE (PF) 100 MCG/2ML IJ SOLN
INTRAMUSCULAR | Status: DC | PRN
  Administered 2020-12-10 (×2): 25 ug via INTRAVENOUS
  Administered 2020-12-10 (×3): 50 ug via INTRAVENOUS

## 2020-12-10 MED ORDER — LIDOCAINE HCL (PF) 2 % IJ SOLN
INTRAMUSCULAR | Status: AC
  Filled 2020-12-10: qty 5

## 2020-12-10 MED ORDER — DIPHENHYDRAMINE HCL 12.5 MG/5ML PO ELIX: 12.5000 mg | ORAL_SOLUTION | Freq: Four times a day (QID) | ORAL | Status: DC | PRN

## 2020-12-10 MED ORDER — MAGIC MOUTHWASH
15.0000 mL | Freq: Four times a day (QID) | ORAL | Status: DC | PRN
  Filled 2020-12-10: qty 15

## 2020-12-10 MED ORDER — LORATADINE 10 MG PO TABS
10.0000 mg | ORAL_TABLET | Freq: Every day | ORAL | Status: DC
  Administered 2020-12-10 – 2020-12-15 (×6): 10 mg via ORAL
  Filled 2020-12-10 (×6): qty 1

## 2020-12-10 MED ORDER — ALUM & MAG HYDROXIDE-SIMETH 200-200-20 MG/5ML PO SUSP: 30.0000 mL | Freq: Four times a day (QID) | ORAL | Status: DC | PRN

## 2020-12-10 MED ORDER — TRAMADOL HCL 50 MG PO TABS
50.0000 mg | ORAL_TABLET | Freq: Four times a day (QID) | ORAL | Status: DC | PRN
  Administered 2020-12-10 (×2): 50 mg via ORAL
  Administered 2020-12-11: 100 mg via ORAL
  Filled 2020-12-10: qty 2
  Filled 2020-12-10 (×2): qty 1

## 2020-12-10 MED ORDER — FENTANYL CITRATE (PF) 100 MCG/2ML IJ SOLN
INTRAMUSCULAR | Status: AC
  Filled 2020-12-10: qty 2

## 2020-12-10 MED ORDER — ONDANSETRON HCL 4 MG/2ML IJ SOLN
INTRAMUSCULAR | Status: DC | PRN
  Administered 2020-12-10: 4 mg via INTRAVENOUS

## 2020-12-10 MED ORDER — LACTATED RINGERS IV BOLUS: 1000.0000 mL | Freq: Three times a day (TID) | INTRAVENOUS | Status: AC | PRN

## 2020-12-10 MED ORDER — FLUTICASONE PROPIONATE 50 MCG/ACT NA SUSP
1.0000 | Freq: Every day | NASAL | Status: DC
  Administered 2020-12-11 – 2020-12-16 (×5): 1 via NASAL
  Filled 2020-12-10: qty 16

## 2020-12-10 MED ORDER — GABAPENTIN 300 MG PO CAPS
300.0000 mg | ORAL_CAPSULE | ORAL | Status: AC
  Administered 2020-12-10: 300 mg via ORAL
  Filled 2020-12-10: qty 1

## 2020-12-10 MED ORDER — ENALAPRILAT 1.25 MG/ML IV SOLN
0.6250 mg | Freq: Four times a day (QID) | INTRAVENOUS | Status: DC | PRN
  Filled 2020-12-10: qty 1

## 2020-12-10 MED ORDER — ALVIMOPAN 12 MG PO CAPS
12.0000 mg | ORAL_CAPSULE | Freq: Two times a day (BID) | ORAL | Status: DC
  Administered 2020-12-11: 12 mg via ORAL
  Filled 2020-12-10: qty 1

## 2020-12-10 MED ORDER — DEXAMETHASONE SODIUM PHOSPHATE 10 MG/ML IJ SOLN
INTRAMUSCULAR | Status: AC
  Filled 2020-12-10: qty 1

## 2020-12-10 MED ORDER — METHOCARBAMOL 500 MG PO TABS
1000.0000 mg | ORAL_TABLET | Freq: Four times a day (QID) | ORAL | Status: DC | PRN
  Administered 2020-12-13: 1000 mg via ORAL
  Filled 2020-12-10: qty 2

## 2020-12-10 MED ORDER — ACETAMINOPHEN 500 MG PO TABS
1000.0000 mg | ORAL_TABLET | ORAL | Status: AC
  Administered 2020-12-10: 1000 mg via ORAL
  Filled 2020-12-10: qty 2

## 2020-12-10 MED ORDER — SODIUM CHLORIDE 0.9 % IV SOLN: Freq: Three times a day (TID) | INTRAVENOUS | Status: AC | PRN

## 2020-12-10 MED ORDER — BUPIVACAINE LIPOSOME 1.3 % IJ SUSP: 20.0000 mL | Freq: Once | INTRAMUSCULAR | Status: DC

## 2020-12-10 MED ORDER — METOPROLOL TARTRATE 5 MG/5ML IV SOLN
5.0000 mg | Freq: Four times a day (QID) | INTRAVENOUS | Status: DC | PRN
Start: 2020-12-10 — End: 2020-12-16

## 2020-12-10 MED ORDER — PHENYLEPHRINE HCL-NACL 20-0.9 MG/250ML-% IV SOLN
INTRAVENOUS | Status: AC
  Filled 2020-12-10: qty 250

## 2020-12-10 MED ORDER — TRAMADOL HCL 50 MG PO TABS: 50.0000 mg | ORAL_TABLET | Freq: Four times a day (QID) | ORAL | 0 refills | Status: DC | PRN

## 2020-12-10 MED ORDER — ENOXAPARIN SODIUM 40 MG/0.4ML IJ SOSY
40.0000 mg | PREFILLED_SYRINGE | INTRAMUSCULAR | Status: DC
  Administered 2020-12-11 – 2020-12-16 (×6): 40 mg via SUBCUTANEOUS
  Filled 2020-12-10 (×6): qty 0.4

## 2020-12-10 MED ORDER — LIDOCAINE 2% (20 MG/ML) 5 ML SYRINGE
INTRAMUSCULAR | Status: DC | PRN
  Administered 2020-12-10: 1.5 mg/kg/h via INTRAVENOUS
  Administered 2020-12-10: 60 mg via INTRAVENOUS

## 2020-12-10 MED ORDER — BISACODYL 5 MG PO TBEC: 20.0000 mg | DELAYED_RELEASE_TABLET | Freq: Once | ORAL | Status: DC

## 2020-12-10 MED ORDER — ROCURONIUM BROMIDE 10 MG/ML (PF) SYRINGE
PREFILLED_SYRINGE | INTRAVENOUS | Status: AC
  Filled 2020-12-10: qty 10

## 2020-12-10 MED ORDER — ORAL CARE MOUTH RINSE: 15.0000 mL | Freq: Once | OROMUCOSAL | Status: AC

## 2020-12-10 MED ORDER — ACETAMINOPHEN 500 MG PO TABS
1000.0000 mg | ORAL_TABLET | Freq: Four times a day (QID) | ORAL | Status: DC
  Filled 2020-12-10: qty 2

## 2020-12-10 MED ORDER — ENSURE PRE-SURGERY PO LIQD
592.0000 mL | Freq: Once | ORAL | Status: DC
  Filled 2020-12-10: qty 592

## 2020-12-10 MED ORDER — PROCHLORPERAZINE EDISYLATE 10 MG/2ML IJ SOLN: 5.0000 mg | Freq: Four times a day (QID) | INTRAMUSCULAR | Status: DC | PRN

## 2020-12-10 MED ORDER — PHENYLEPHRINE HCL-NACL 20-0.9 MG/250ML-% IV SOLN
INTRAVENOUS | Status: DC | PRN
  Administered 2020-12-10: 25 ug/min via INTRAVENOUS

## 2020-12-10 MED ORDER — ENOXAPARIN SODIUM 40 MG/0.4ML IJ SOSY: 40.0000 mg | PREFILLED_SYRINGE | Freq: Once | INTRAMUSCULAR | Status: DC

## 2020-12-10 MED ORDER — BUPIVACAINE LIPOSOME 1.3 % IJ SUSP
INTRAMUSCULAR | Status: AC
  Filled 2020-12-10: qty 20

## 2020-12-10 MED ORDER — ENSURE SURGERY PO LIQD
237.0000 mL | Freq: Two times a day (BID) | ORAL | Status: DC
  Administered 2020-12-10 – 2020-12-11 (×2): 237 mL via ORAL

## 2020-12-10 MED ORDER — CHLORHEXIDINE GLUCONATE 0.12 % MT SOLN
15.0000 mL | Freq: Once | OROMUCOSAL | Status: AC
  Administered 2020-12-10: 15 mL via OROMUCOSAL
  Filled 2020-12-10: qty 15

## 2020-12-10 MED ORDER — ONDANSETRON HCL 4 MG/2ML IJ SOLN
INTRAMUSCULAR | Status: AC
  Filled 2020-12-10: qty 2

## 2020-12-10 MED ORDER — STERILE WATER FOR IRRIGATION IR SOLN
Status: DC | PRN
  Administered 2020-12-10: 1000 mL

## 2020-12-10 MED ORDER — EPHEDRINE 5 MG/ML INJ
INTRAVENOUS | Status: AC
  Filled 2020-12-10: qty 5

## 2020-12-10 MED ORDER — SIMETHICONE 80 MG PO CHEW: 40.0000 mg | CHEWABLE_TABLET | Freq: Four times a day (QID) | ORAL | Status: DC | PRN

## 2020-12-10 MED ORDER — LIP MEDEX EX OINT
1.0000 "application " | TOPICAL_OINTMENT | Freq: Two times a day (BID) | CUTANEOUS | Status: DC
  Administered 2020-12-11 – 2020-12-16 (×10): 1 via TOPICAL
  Filled 2020-12-10 (×2): qty 7

## 2020-12-10 MED ORDER — ONDANSETRON HCL 4 MG/2ML IJ SOLN: 4.0000 mg | Freq: Four times a day (QID) | INTRAMUSCULAR | Status: DC | PRN

## 2020-12-10 MED ORDER — METHOCARBAMOL 1000 MG/10ML IJ SOLN
1000.0000 mg | Freq: Four times a day (QID) | INTRAVENOUS | Status: DC | PRN
  Filled 2020-12-10: qty 10

## 2020-12-10 MED ORDER — ENOXAPARIN SODIUM 60 MG/0.6ML IJ SOSY: 50.0000 mg | PREFILLED_SYRINGE | INTRAMUSCULAR | Status: DC

## 2020-12-10 MED ORDER — POLYVINYL ALCOHOL 1.4 % OP SOLN
1.0000 [drp] | Freq: Every day | OPHTHALMIC | Status: DC
  Administered 2020-12-11 – 2020-12-16 (×5): 1 [drp] via OPHTHALMIC
  Filled 2020-12-10: qty 15

## 2020-12-10 MED ORDER — EPHEDRINE SULFATE-NACL 50-0.9 MG/10ML-% IV SOSY
PREFILLED_SYRINGE | INTRAVENOUS | Status: DC | PRN
  Administered 2020-12-10: 5 mg via INTRAVENOUS

## 2020-12-10 MED ORDER — SODIUM CHLORIDE 0.9 % IV SOLN
2.0000 g | INTRAVENOUS | Status: AC
  Administered 2020-12-10: 2 g via INTRAVENOUS
  Filled 2020-12-10: qty 2

## 2020-12-10 SURGICAL SUPPLY — 108 items
APPLIER CLIP 5 13 M/L LIGAMAX5 (MISCELLANEOUS)
APPLIER CLIP ROT 10 11.4 M/L (STAPLE)
BAG COUNTER SPONGE SURGICOUNT (BAG) ×2 IMPLANT
BLADE EXTENDED COATED 6.5IN (ELECTRODE) IMPLANT
CANNULA REDUC XI 12-8 STAPL (CANNULA)
CANNULA REDUCER 12-8 DVNC XI (CANNULA) IMPLANT
CELLS DAT CNTRL 66122 CELL SVR (MISCELLANEOUS) IMPLANT
CHLORAPREP W/TINT 26 (MISCELLANEOUS) IMPLANT
CLIP APPLIE 5 13 M/L LIGAMAX5 (MISCELLANEOUS) IMPLANT
CLIP APPLIE ROT 10 11.4 M/L (STAPLE) IMPLANT
COVER SURGICAL LIGHT HANDLE (MISCELLANEOUS) ×4 IMPLANT
COVER TIP SHEARS 8 DVNC (MISCELLANEOUS) ×1 IMPLANT
COVER TIP SHEARS 8MM DA VINCI (MISCELLANEOUS) ×1
DECANTER SPIKE VIAL GLASS SM (MISCELLANEOUS) ×2 IMPLANT
DEVICE TROCAR PUNCTURE CLOSURE (ENDOMECHANICALS) IMPLANT
DRAIN CHANNEL 19F RND (DRAIN) IMPLANT
DRAPE ARM DVNC X/XI (DISPOSABLE) ×4 IMPLANT
DRAPE COLUMN DVNC XI (DISPOSABLE) ×1 IMPLANT
DRAPE DA VINCI XI ARM (DISPOSABLE) ×4
DRAPE DA VINCI XI COLUMN (DISPOSABLE) ×1
DRAPE SURG IRRIG POUCH 19X23 (DRAPES) ×2 IMPLANT
DRSG OPSITE POSTOP 4X10 (GAUZE/BANDAGES/DRESSINGS) IMPLANT
DRSG OPSITE POSTOP 4X6 (GAUZE/BANDAGES/DRESSINGS) ×2 IMPLANT
DRSG OPSITE POSTOP 4X8 (GAUZE/BANDAGES/DRESSINGS) IMPLANT
DRSG TEGADERM 2-3/8X2-3/4 SM (GAUZE/BANDAGES/DRESSINGS) ×10 IMPLANT
DRSG TEGADERM 4X4.75 (GAUZE/BANDAGES/DRESSINGS) IMPLANT
ELECT PENCIL ROCKER SW 15FT (MISCELLANEOUS) ×2 IMPLANT
ELECT REM PT RETURN 15FT ADLT (MISCELLANEOUS) ×2 IMPLANT
ENDOLOOP SUT PDS II  0 18 (SUTURE)
ENDOLOOP SUT PDS II 0 18 (SUTURE) IMPLANT
EVACUATOR SILICONE 100CC (DRAIN) IMPLANT
GAUZE SPONGE 2X2 8PLY STRL LF (GAUZE/BANDAGES/DRESSINGS) ×1 IMPLANT
GLOVE SURG NEOPR MICRO LF SZ8 (GLOVE) ×6 IMPLANT
GLOVE SURG UNDER LTX SZ8 (GLOVE) ×6 IMPLANT
GOWN STRL REUS W/TWL XL LVL3 (GOWN DISPOSABLE) ×6 IMPLANT
GRASPER SUT TROCAR 14GX15 (MISCELLANEOUS) IMPLANT
HOLDER FOLEY CATH W/STRAP (MISCELLANEOUS) ×2 IMPLANT
IRRIG SUCT STRYKERFLOW 2 WTIP (MISCELLANEOUS) ×2
IRRIGATION SUCT STRKRFLW 2 WTP (MISCELLANEOUS) ×1 IMPLANT
KIT PROCEDURE DA VINCI SI (MISCELLANEOUS)
KIT PROCEDURE DVNC SI (MISCELLANEOUS) IMPLANT
KIT SIGMOIDOSCOPE (SET/KITS/TRAYS/PACK) IMPLANT
KIT TURNOVER KIT A (KITS) ×2 IMPLANT
NEEDLE INSUFFLATION 14GA 120MM (NEEDLE) ×2 IMPLANT
PACK CARDIOVASCULAR III (CUSTOM PROCEDURE TRAY) ×2 IMPLANT
PACK COLON (CUSTOM PROCEDURE TRAY) ×2 IMPLANT
PAD POSITIONING PINK XL (MISCELLANEOUS) ×2 IMPLANT
PROTECTOR NERVE ULNAR (MISCELLANEOUS) ×4 IMPLANT
RELOAD STAPLER 2.5X60 WHT DVNC (STAPLE) ×2 IMPLANT
RELOAD STAPLER 3.5X45 BLU DVNC (STAPLE) IMPLANT
RELOAD STAPLER 3.5X60 BLU DVNC (STAPLE) ×2 IMPLANT
RELOAD STAPLER 4.3X45 GRN DVNC (STAPLE) IMPLANT
RELOAD STAPLER 4.3X60 GRN DVNC (STAPLE) IMPLANT
RTRCTR WOUND ALEXIS 18CM MED (MISCELLANEOUS)
SCISSORS LAP 5X35 DISP (ENDOMECHANICALS) ×2 IMPLANT
SEAL CANN UNIV 5-8 DVNC XI (MISCELLANEOUS) ×3 IMPLANT
SEAL XI 5MM-8MM UNIVERSAL (MISCELLANEOUS) ×3
SEALER VESSEL DA VINCI XI (MISCELLANEOUS) ×1
SEALER VESSEL EXT DVNC XI (MISCELLANEOUS) ×1 IMPLANT
SOLUTION ELECTROLUBE (MISCELLANEOUS) ×2 IMPLANT
SPONGE GAUZE 2X2 STER 10/PKG (GAUZE/BANDAGES/DRESSINGS) ×1
STAPLER 45 DA VINCI SURE FORM (STAPLE)
STAPLER 45 SUREFORM DVNC (STAPLE) IMPLANT
STAPLER 60 DA VINCI SURE FORM (STAPLE) ×1
STAPLER 60 SUREFORM DVNC (STAPLE) ×1 IMPLANT
STAPLER CANNULA SEAL DVNC XI (STAPLE) ×1 IMPLANT
STAPLER CANNULA SEAL XI (STAPLE) ×1
STAPLER ECHELON POWER CIR 29 (STAPLE) IMPLANT
STAPLER ECHELON POWER CIR 31 (STAPLE) IMPLANT
STAPLER RELOAD 2.5X60 WHITE (STAPLE) ×2
STAPLER RELOAD 2.5X60 WHT DVNC (STAPLE) ×2
STAPLER RELOAD 3.5X45 BLU DVNC (STAPLE)
STAPLER RELOAD 3.5X45 BLUE (STAPLE)
STAPLER RELOAD 3.5X60 BLU DVNC (STAPLE) ×2
STAPLER RELOAD 3.5X60 BLUE (STAPLE) ×2
STAPLER RELOAD 4.3X45 GREEN (STAPLE)
STAPLER RELOAD 4.3X45 GRN DVNC (STAPLE)
STAPLER RELOAD 4.3X60 GREEN (STAPLE)
STAPLER RELOAD 4.3X60 GRN DVNC (STAPLE)
STOPCOCK 4 WAY LG BORE MALE ST (IV SETS) ×4 IMPLANT
SURGILUBE 2OZ TUBE FLIPTOP (MISCELLANEOUS) IMPLANT
SUT MNCRL AB 4-0 PS2 18 (SUTURE) ×2 IMPLANT
SUT PDS AB 1 CT1 27 (SUTURE) ×4 IMPLANT
SUT PROLENE 0 CT 2 (SUTURE) IMPLANT
SUT PROLENE 2 0 KS (SUTURE) IMPLANT
SUT PROLENE 2 0 SH DA (SUTURE) IMPLANT
SUT SILK 2 0 (SUTURE)
SUT SILK 2 0 SH CR/8 (SUTURE) IMPLANT
SUT SILK 2-0 18XBRD TIE 12 (SUTURE) IMPLANT
SUT SILK 3 0 (SUTURE)
SUT SILK 3 0 SH CR/8 (SUTURE) ×2 IMPLANT
SUT SILK 3-0 18XBRD TIE 12 (SUTURE) IMPLANT
SUT V-LOC BARB 180 2/0GR6 GS22 (SUTURE) ×4
SUT VIC AB 3-0 SH 18 (SUTURE) IMPLANT
SUT VIC AB 3-0 SH 27 (SUTURE)
SUT VIC AB 3-0 SH 27XBRD (SUTURE) IMPLANT
SUT VICRYL 0 UR6 27IN ABS (SUTURE) ×2 IMPLANT
SUTURE V-LC BRB 180 2/0GR6GS22 (SUTURE) ×2 IMPLANT
SYR 10ML ECCENTRIC (SYRINGE) ×2 IMPLANT
SYS LAPSCP GELPORT 120MM (MISCELLANEOUS)
SYS WOUND ALEXIS 18CM MED (MISCELLANEOUS) ×2
SYSTEM LAPSCP GELPORT 120MM (MISCELLANEOUS) IMPLANT
SYSTEM WOUND ALEXIS 18CM MED (MISCELLANEOUS) ×1 IMPLANT
TOWEL OR NON WOVEN STRL DISP B (DISPOSABLE) ×2 IMPLANT
TRAY FOLEY MTR SLVR 16FR STAT (SET/KITS/TRAYS/PACK) ×2 IMPLANT
TROCAR ADV FIXATION 5X100MM (TROCAR) ×2 IMPLANT
TUBING CONNECTING 10 (TUBING) ×4 IMPLANT
TUBING INSUFFLATION 10FT LAP (TUBING) ×2 IMPLANT

## 2020-12-10 NOTE — Progress Notes (Signed)
Spoke to [place] and received surgery report for 0730 Dr.Gross case.

## 2020-12-10 NOTE — Op Note (Signed)
12/10/2020  9:58 AM  PATIENT:  Colleen Hancock  75 y.o. female  Patient Care Team: Velna Hatchet, MD as PCP - General (Internal Medicine) Michael Boston, MD as Consulting Physician (General Surgery) Milus Banister, MD as Attending Physician (Gastroenterology) Janyth Contes, MD as Consulting Physician (Obstetrics and Gynecology) Nicholas Lose, MD as Consulting Physician (Hematology and Oncology)  PRE-OPERATIVE DIAGNOSIS:  CECAL CANCER  POST-OPERATIVE DIAGNOSIS:  CECAL CANCER  PROCEDURE:  ROBOTIC PROXIMAL COLECTOMY OMENTOPEXY TRANSVERSUS ABDOMINIS PLANE (TAP) BLOCK - BILATERAL  SURGEON:  Adin Hector, MD  ASSISTANT: Nadeen Landau, MD An experienced assistant was required given the standard of surgical care given the complexity of the case.  This assistant was needed for exposure, dissection, suction, tissue approximation, retraction, perception, etc.  ANESTHESIA:     General  [place]ional TRANSVERSUS ABDOMINIS PLANE (TAP) nerve block for perioperative & postoperative pain control provided with liposomal bupivacaine (Experel) mixed with 0.25% bupivacaine as a Bilateral TAP block x 37mL each side at the level of the transverse abdominis & preperitoneal spaces along the flank at the anterior axillary line, from subcostal ridge to iliac crest under laparoscopic guidance   Local field block at port sites & extraction wound  EBL:  Total I/O In: 1100 [I.V.:1000; IV Piggyback:100] Out: 175 [Urine:150; Blood:25]  Delay start of Pharmacological VTE agent (>24hrs) due to surgical blood loss or risk of bleeding:  no  DRAINS: none   SPECIMEN: Proximal "right" colon  DISPOSITION OF SPECIMEN:  PATHOLOGY  COUNTS:  YES  PLAN OF CARE: Admit to inpatient   PATIENT DISPOSITION:  PACU - hemodynamically stable.  INDICATION:    Pleasant woman who underwent colonoscopy.  Polypectomy done near appendiceal orifice of cecum.  Fragments of cancer and high-grade dysplasia  noted.  Work-up showing no obvious metastatic disease I recommended segmental resection:  The anatomy & physiology of the digestive tract was discussed.  The pathophysiology was discussed.  Natural history risks without surgery was discussed.   I worked to give an overview of the disease and the frequent need to have multispecialty involvement.  I feel the risks of no intervention will lead to serious problems that outweigh the operative risks; therefore, I recommended a partial colectomy to remove the pathology.  Laparoscopic & open techniques were discussed.   Risks such as bleeding, infection, abscess, leak, reoperation, possible ostomy, hernia, heart attack, death, and other risks were discussed.  I noted a good likelihood this will help address the problem.   Goals of post-operative recovery were discussed as well.  We will work to minimize complications.  An educational handout on the pathology was given as well.  Questions were answered.    The patient expresses understanding & wishes to proceed with surgery.  OR FINDINGS:   Patient had mildly redundant colon.  Firm nodule at cecum near appendiceal orifice correlating with endoscopic and pathology findings.  No obvious metastatic disease on visceral parietal peritoneum or liver.  It is an isoperistaltic ileocolonic anastomosis that rests in the right lower quadrant region.  CASE DATA:  Type of patient?: Elective [facility] Private Case  Status of Case? Elective Scheduled  Infection Present At Time Of Surgery (PATOS)?  NO  DESCRIPTION:   Informed consent was confirmed.  The patient underwent general anaesthesia without difficulty.  The patient was positioned with arms tucked & secured appropriately.  VTE prevention in place.  The patient's abdomen was clipped, prepped, & draped in a sterile fashion.  Surgical timeout confirmed our plan.  The patient  was positioned in reverse Trendelenburg.  Abdominal entry was gained using Varess technique  at the left subcostal ridge on the anterior abdominal wall.  No elevated EtCO2 noted.  Port placed.  Camera inspection revealed no injury.  Extra ports were carefully placed under direct laparoscopic visualization.  We docked the [employer] Vinci robot carefully and placed intstruments under visualization  I mobilized & reflected the greater omentum and small bowel in the upper abdomen.  I was able to elevate the proximal colon to isolate the ileocolonic pedicle.  I scored the ileal mesentery just proximal to that.   I carried that further dissection in a medial to lateral fashion.  I was able to bluntly get into the retro-mesenteric plane on the right side.  I freed the proximal right sided colonic mesentery off the retroperitoneum including the duodenal sweep, pancreatic head, & Gerota's fascia of the right kidney. I was able to get underneath the hepatic flexure.  I was able to get underneath the proximal and mid transverse colon.  I isolated the proximal ileocecal pedicle.  I skeletonized it & transected the vessels.    I then proceeded to mobilize the terminal ileum & proximal "right" colon in a lateral to medial fashion.  I mobilized the distal ileal mesentery off its retroperitoneal and pelvic attachments.   I mobilized the greater omentum off the mid transverse colon and mobilized the mid to proximal transverse colon in a superior to inferior fashion.  I mobilized the ascending colon off It is side wall attachments to the paracolic gutter and retroperitoneum.  I  This allowed me to mobilize the hepatic flexure and get a complete mobilization of the proximal "right" colon to the mid-transverse colon..  I could isolate the pathology. When he went ahead and proceeded with transection.  I transected the distal ileal mesentery and then transected at the distal ileum with a robotic stapler 28mm blue load.  I then transected transverse colon mesentery just proximal to a dominant middle colic arterial pedicle  radially.  Transected at the proximal transverse colon with a robotic stapler.  We assured hemostasis.   I did a side-to-side stapled anastomosis of ileum to mid-transverse colon using a 48mm white load in an isoperistaltic fashion.  (Distal stump of ileum to mid transverse colon for the distal end of the anastomosis.  Proximal end of colon stump to more proximal ileum for the proximal end of the anastomosis).  I had to do a second firing with another white load.  I sewed the common staple channel wound with an absorbable suture ( 2-0 V-lock) in a running [place] fashion from each corner and meeting in the center.  I did meticulous inspection prove an airtight closure.  I protected the anastomosis line with an anterior omentopexy of greater omentum using V lock suture.  We did reinspection of the abdomen.  Hemostasis was good.   Ureters, retroperitoneum, and bowel uninjured.  The anastomosis looked healthy.   Endoluminal gas was evacuated.  We placed the wound protector through the suprapubic 7mm port site after it was enlarged in a Pfannenstiel fashion.  Specimen removed without incident.   Ports & wound protector removed.  Hemostasis was good.  Sterile unused instruments were used from this point.  I closed the skin at the port sites using Monocryl stitch and sterile dressing.  I closed the extraction wound using a 0 Vicryl vertical peritoneal closure and a #1 PDS transverse anterior rectal fascial closure like a small Pfannenstiel closure. I closed  the skin with monocryl stitches.  I placed sterile dressings.     Patient is being extubated go to recovery room. I discussed postop care with the patient in detail the office & in the holding area. Instructions are written. I discussed operative findings, updated the patient's status, discussed probable steps to recovery, and gave postoperative recommendations to the patient's daughter, Nunzio Cory .  Recommendations were made.  Questions were answered.  She  expressed understanding & appreciation.   Adin Hector, M.D., F.A.C.S. Gastrointestinal and Minimally Invasive Surgery [facility] [address], [place],  [id number] [phone] Main / Paging

## 2020-12-10 NOTE — Interval H&P Note (Signed)
History and Physical Interval Note:  12/10/2020 7:21 AM  Colleen Hancock  has presented today for surgery, with the diagnosis of COLON CANCER.  The various methods of treatment have been discussed with the patient and family. After consideration of risks, benefits and other options for treatment, the patient has consented to  Procedure(s): ROBOTIC COLECTOMY PROXIMAL (N/A) as a surgical intervention.  The patient's history has been reviewed, patient examined, no change in status, stable for surgery.  I have reviewed the patient's chart and labs.  Questions were answered to the patient's satisfaction.    I have re-reviewed the the patient's records, history, medications, and allergies.  I have re-examined the patient.  I again discussed intraoperative plans and goals of post-operative recovery.  The patient agrees to proceed.  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   Asthma 01/26/2016   PNA (pneumonia) 04/11/2014   Hyperlipemia 04/11/2014   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    Past Medical History:  Diagnosis Date   Allergy    Anxiety 09/14/2020   Arthritis    hands, knees   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   Breast CA ([place]) 10/2012   Left Breast   Complication of anesthesia    during colonoscopy fentanyl dropped blood pressure 2013 or 2014   Diverticulitis 09/14/2020   no meds currently   Dyspnea 09/14/2020   on exertion can  climb flight of steps   Fatty liver 09/14/2020   GERD (gastroesophageal reflux disease)    Heart murmur 09/14/2020   mild no cardiologist   High cholesterol 09/14/2020   History of migraine    none since 1990's   Hypertension 09/14/2020   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   with diarrhea   Pneumonia    yrs ago per pt on 09-14-2020   Swelling of joint of left shoulder 09/14/2020   swelling under left shoulder, 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;  Service: Gynecology;  Laterality: N/A;   MASTECTOMY Left 2018   MASTECTOMY W/ SENTINEL NODE BIOPSY Left 08/18/2016   MASTECTOMY W/ SENTINEL NODE BIOPSY Left 08/18/2016   Procedure: LEFT TOTAL MASTECTOMY WITH SENTINEL LYMPH NODE BIOPSY WITH BLUE DYE INJECTION;  Surgeon: Rolm Bookbinder, MD;  Location: [facility];  Service: General;  Laterality: Left;   SKIN CANCER EXCISION Left 2009   eye brow    Social History   Socioeconomic History   Marital status: Married    Spouse name: Not on file   Number of children: 2   Years of education: Not on file   Highest education level: Not on file  Occupational History   Occupation: retired  Tobacco Use   Smoking status: Never   Smokeless tobacco: Never  Vaping Use   Vaping Use: Never used  Substance  and Sexual Activity   Alcohol use: Yes    Comment: 1 glass wine at new years only   Drug use: No   Sexual activity: Not on file  Other Topics Concern   Not on file  Social History Narrative   Not on file   Social Determinants of Health   Financial Resource Strain: Not on file  Food Insecurity: Not on file  Transportation Needs: Not on file  Physical Activity: Not on file  Stress: Not on file  Social Connections: Not on file  Intimate Partner Violence: Not on file    Family History  Problem Relation Age of Onset   Alzheimer's disease Mother    Heart attack Father    Heart disease Father    Multiple sclerosis Brother    Epilepsy Brother    Alzheimer's disease Brother    Hypertension Brother    Heart disease Brother    Hyperlipidemia Brother    Kidney Stones Brother    Breast cancer Maternal Aunt    Hodgkin's lymphoma Son    Heart disease Son    Diabetes Son    Breast cancer Cousin    Breast cancer Cousin    Breast cancer Cousin    Heart disease Other    Colon polyps Neg Hx    Colon cancer Neg Hx    Esophageal cancer Neg Hx    Rectal cancer Neg Hx    Stomach cancer Neg Hx     Facility-Administered Medications Prior to Admission  Medication Dose Route Frequency Provider Last Rate Last Admin   0.9 %  sodium chloride infusion  500 mL Intravenous Once Milus Banister, MD       Medications Prior to Admission  Medication Sig Dispense Refill Last Dose   albuterol (PROVENTIL HFA;VENTOLIN HFA) 108 (90 BASE) MCG/ACT inhaler Inhale 1 puff into the lungs every 6 (six) hours as needed for wheezing or shortness of breath (Asthma).      atorvastatin (LIPITOR) 20 MG tablet Take 20 mg by mouth daily before breakfast.      cetirizine (ZYRTEC ALLERGY) 10 MG tablet Take 1 tablet (10 mg total) by mouth daily. (Patient taking differently: Take 10 mg by mouth every evening.)      cholestyramine (QUESTRAN) 4 GM/DOSE powder Take 0.75 packets by mouth daily after breakfast. 3/4 scoop  (MUST TAKE 1 hour after dicyclomine)      dicyclomine (BENTYL) 10 MG capsule Take 10 mg by mouth daily at 6 (six) AM. (1st thing in the morning)      fluticasone (FLONASE) 50 MCG/ACT nasal spray Place 1 spray into both nostrils daily.      lisinopril (ZESTRIL) 20 MG tablet Take 20 mg by mouth at bedtime.      Multiple Vitamins-Minerals (MULTIVITAMIN GUMMIES WOMENS PO) Take 1 tablet by mouth every evening. One-A-Day Women's  pill      Omega-3 Fatty Acids (FISH OIL) 1200 MG CAPS Take 1,200 mg by mouth every  evening.      Polyethyl Glycol-Propyl Glycol (SYSTANE) 0.4-0.3 % SOLN Place 1 drop into both eyes daily.      traMADol (ULTRAM) 50 MG tablet Take 1 tablet (50 mg total) by mouth every 6 (six) hours as needed for moderate pain (for pain.). 20 tablet 0     Current Facility-Administered Medications  Medication Dose Route Frequency Provider Last Rate Last Admin   0.9 % NaCl with KCl 20 mEq/ L  infusion   Intravenous Continuous Coralie Keens, MD 75 mL/hr at 12/10/20 0012 New Bag at 12/10/20 0012   [MAR Hold] acetaminophen (TYLENOL) tablet 1,000 mg  1,000 mg Oral Q6H PRN Coralie Keens, MD       [MAR Hold] albuterol (PROVENTIL) (2.5 MG/3ML) 0.083% nebulizer solution 3 mL  3 mL Inhalation Q6H PRN Coralie Keens, MD       bupivacaine liposome (EXPAREL) 1.3 % injection 266 mg  20 mL Infiltration Once Michael Boston, MD       cefoTEtan (CEFOTAN) 2 g in sodium chloride 0.9 % 100 mL IVPB  2 g Intravenous On Call to OR Michael Boston, MD       [facility] Hold] enoxaparin (LOVENOX) injection 40 mg  40 mg Subcutaneous Q24H Coralie Keens, MD   40 mg at 12/10/20 0015   enoxaparin (LOVENOX) injection 40 mg  40 mg Subcutaneous Once Michael Boston, MD       feeding supplement (ENSURE PRE-SURGERY) liquid 296 mL  296 mL Oral Once Michael Boston, MD       feeding supplement (ENSURE PRE-SURGERY) liquid 592 mL  592 mL Oral Once Michael Boston, MD       influenza vaccine adjuvanted (FLUAD) injection 0.5 mL  0.5 mL  Intramuscular Tomorrow-1000 Hong, Greggory Brandy, MD       lactated ringers infusion   Intravenous Continuous Belinda Block, MD 10 mL/hr at 12/10/20 0712 New Bag at 12/10/20 0712   [MAR Hold] lisinopril (ZESTRIL) tablet 20 mg  20 mg Oral QHS Coralie Keens, MD   20 mg at 12/10/20 0015   [MAR Hold] mupirocin ointment (BACTROBAN) 2 % 1 application  1 application Nasal BID Michael Boston, MD       phenylephrine (NEOSYNEPHRINE) 20-0.9 MG/250ML-% infusion              Allergies  Allergen Reactions   Fentanyl Other (See Comments)    Low blood pressure.   Tetracyclines & Related Hives   Other     SEAFOOD > UNSPECIFIED REACTION      Sodium Lactate Diarrhea   Clams [Shellfish Allergy] Diarrhea   Nsaids Nausea And Vomiting    Stomach upset /had hx of stomach ulcers   Pentazocine Lactate Nausea And Vomiting    [place] n/v tunnel vision    BP (!) 141/55   Pulse 74   Temp 98.9 F (37.2 C) (Oral)   Resp 16   Ht 5' 3.75" (1.619 m)   Wt 91.6 kg   SpO2 98%   BMI 34.94 kg/m   Labs: Results for orders placed or performed during the hospital encounter of 12/09/20 (from the past 48 hour(s))  Comprehensive metabolic panel     Status: Abnormal   Collection Time: 12/09/20  6:15 PM  Result Value Ref Range   Sodium 133 (L) 135 - 145 mmol/L   Potassium 3.7 3.5 - 5.1 mmol/L   Chloride 103 98 - 111 mmol/L   CO2 19 (L) 22 - 32 mmol/L   Glucose, Bld 134 (H) 70 - 99  mg/dL    Comment: Glucose reference range applies only to samples taken after fasting for at least 8 hours.   BUN 17 8 - 23 mg/dL   Creatinine, Ser 1.28 (H) 0.44 - 1.00 mg/dL   Calcium 9.7 8.9 - 10.3 mg/dL   Total Protein 7.5 6.5 - 8.1 g/dL   Albumin 4.7 3.5 - 5.0 g/dL   AST 40 15 - 41 U/L   ALT 35 0 - 44 U/L   Alkaline Phosphatase 65 38 - 126 U/L   Total Bilirubin 1.2 0.3 - 1.2 mg/dL   GFR, Estimated 44 (L) >60 mL/min    Comment: (NOTE) Calculated using the CKD-EPI Creatinine Equation (2021)    Anion gap 11 5 - 15    Comment:  Performed at [facility], [place] [address] [place], [place] [address]  CBC with Differential     Status: None   Collection Time: 12/09/20  6:15 PM  Result Value Ref Range   WBC 9.1 4.0 - 10.5 K/uL   RBC 4.08 3.87 - 5.11 MIL/uL   Hemoglobin 13.4 12.0 - 15.0 g/dL   HCT 40.2 36.0 - 46.0 %   MCV 98.5 80.0 - 100.0 fL   MCH 32.8 26.0 - 34.0 pg   MCHC 33.3 30.0 - 36.0 g/dL   RDW 12.7 11.5 - 15.5 %   Platelets 168 150 - 400 K/uL   nRBC 0.0 0.0 - 0.2 %   Neutrophils Relative % 79 %   Neutro Abs 7.1 1.7 - 7.7 K/uL   Lymphocytes Relative 14 %   Lymphs Abs 1.3 0.7 - 4.0 K/uL   Monocytes Relative 7 %   Monocytes Absolute 0.7 0.1 - 1.0 K/uL   Eosinophils Relative 0 %   Eosinophils Absolute 0.0 0.0 - 0.5 K/uL   Basophils Relative 0 %   Basophils Absolute 0.0 0.0 - 0.1 K/uL   Immature Granulocytes 0 %   Abs Immature Granulocytes 0.03 0.00 - 0.07 K/uL    Comment: Performed at [facility], [place] [address], [place],  [address]  Resp Panel by RT-PCR (Flu A&B, Covid) Nasopharyngeal Swab     Status: None   Collection Time: 12/09/20  7:59 PM   Specimen: Nasopharyngeal Swab; Nasopharyngeal(NP) swabs in vial transport medium  Result Value Ref Range   SARS Coronavirus 2 by RT PCR NEGATIVE NEGATIVE    Comment: (NOTE) SARS-CoV-2 target nucleic acids are NOT DETECTED.  The SARS-CoV-2 RNA is generally detectable in upper respiratory specimens during the acute phase of infection. The lowest concentration of SARS-CoV-2 viral copies this assay can detect is 138 copies/mL. A negative result does not preclude SARS-Cov-2 infection and should not be used as the sole basis for treatment or other patient management decisions. A negative result may occur with  improper specimen collection/handling, submission of specimen other than nasopharyngeal swab, presence of viral mutation(s) within the areas targeted by this assay, and inadequate number of viral copies(<138  copies/mL). A negative result must be combined with clinical observations, patient history, and epidemiological information. The expected result is Negative.  Fact Sheet for Patients:  EntrepreneurPulse.com.au  Fact Sheet for Healthcare Providers:  IncredibleEmployment.be  This test is no t yet approved or cleared by the [place] FDA and  has been authorized for detection and/or diagnosis of SARS-CoV-2 by FDA under an Emergency Use Authorization (EUA). This EUA will remain  in effect (meaning this test can be used) for the duration of the COVID-19 declaration under Section 564(b)(1)  of the Act, 21 U.S.C.section 360bbb-3(b)(1), unless the authorization is terminated  or revoked sooner.       Influenza A by PCR NEGATIVE NEGATIVE   Influenza B by PCR NEGATIVE NEGATIVE    Comment: (NOTE) The Xpert Xpress SARS-CoV-2/FLU/RSV plus assay is intended as an aid in the diagnosis of influenza from Nasopharyngeal swab specimens and should not be used as a sole basis for treatment. Nasal washings and aspirates are unacceptable for Xpert Xpress SARS-CoV-2/FLU/RSV testing.  Fact Sheet for Patients: EntrepreneurPulse.com.au  Fact Sheet for Healthcare Providers: IncredibleEmployment.be  This test is not yet approved or cleared by the [place] FDA and has been authorized for detection and/or diagnosis of SARS-CoV-2 by FDA under an Emergency Use Authorization (EUA). This EUA will remain in effect (meaning this test can be used) for the duration of the COVID-19 declaration under Section 564(b)(1) of the Act, 21 U.S.C. section 360bbb-3(b)(1), unless the authorization is terminated or revoked.  Performed at [facility], [place] [address] [place], [place] [address]   Creatinine, serum     Status: Abnormal   Collection Time: 12/09/20 11:59 PM  Result Value Ref Range   Creatinine, Ser 1.22 (H)  0.44 - 1.00 mg/dL   GFR, Estimated 46 (L) >60 mL/min    Comment: (NOTE) Calculated using the CKD-EPI Creatinine Equation (2021) Performed at [facility], [place] [address], [place], [place] [address]   Surgical PCR screen     Status: None   Collection Time: 12/10/20  3:42 AM   Specimen: Nasal Mucosa; Nasal Swab  Result Value Ref Range   MRSA, PCR NEGATIVE NEGATIVE   Staphylococcus aureus NEGATIVE NEGATIVE    Comment: (NOTE) The Xpert SA Assay (FDA approved for NASAL specimens in patients 14 years of age and older), is one component of a comprehensive surveillance program. It is not intended to diagnose infection nor to guide or monitor treatment. Performed at [facility], [place] [address], [place], [place] [address]   Type and screen [place]     Status: None   Collection Time: 12/10/20  5:41 AM  Result Value Ref Range   ABO/RH(D) A POS    Antibody Screen NEG    Sample Expiration      12/13/2020,2359 Performed at [facility], [place] [address] [place], [place] [address]     Imaging / Studies: CT Head Wo Contrast  Result Date: 12/09/2020 CLINICAL DATA:  Syncopal episode. EXAM: CT HEAD WITHOUT CONTRAST CT CERVICAL SPINE WITHOUT CONTRAST TECHNIQUE: Multidetector CT imaging of the head and cervical spine was performed following the standard protocol without intravenous contrast. Multiplanar CT image reconstructions of the cervical spine were also generated. COMPARISON:  CT chest 11/05/2020 FINDINGS: CT HEAD FINDINGS BRAIN: BRAIN Cerebral ventricle sizes are concordant with the degree of cerebral volume loss. Patchy and confluent areas of decreased attenuation are noted throughout the deep and periventricular white matter of the cerebral hemispheres bilaterally, compatible with chronic microvascular ischemic disease. No evidence of large-territorial acute infarction. No parenchymal hemorrhage. No mass lesion. No  extra-axial collection. No mass effect or midline shift. No hydrocephalus. Basilar cisterns are patent. Vascular: No hyperdense vessel. Skull: No acute fracture or focal lesion. Sinuses/Orbits: Paranasal sinuses and mastoid air cells are clear. The orbits are unremarkable. Other: None. CT CERVICAL SPINE FINDINGS Alignment: Straightening of normal cervical lordosis likely due to positioning and degenerative changes. Grade 1 anterolisthesis of due foreign C5. Skull base and vertebrae: Multilevel at least  moderate degenerative changes of the spine most prominent at the C6-C7 levels. No acute fracture. No aggressive appearing focal osseous lesion or focal pathologic process. Soft tissues and spinal canal: No prevertebral fluid or swelling. No visible canal hematoma. Upper chest: Similar-appearing right apical scarring. Other: Partially retropharyngeal right common carotid artery. IMPRESSION: 1. No acute intracranial abnormality. 2. No acute displaced fracture or traumatic listhesis of the cervical spine. Electronically Signed   By: Iven Finn M.D.   On: 12/09/2020 19:54   CT Cervical Spine Wo Contrast  Result Date: 12/09/2020 CLINICAL DATA:  Syncopal episode. EXAM: CT HEAD WITHOUT CONTRAST CT CERVICAL SPINE WITHOUT CONTRAST TECHNIQUE: Multidetector CT imaging of the head and cervical spine was performed following the standard protocol without intravenous contrast. Multiplanar CT image reconstructions of the cervical spine were also generated. COMPARISON:  CT chest 11/05/2020 FINDINGS: CT HEAD FINDINGS BRAIN: BRAIN Cerebral ventricle sizes are concordant with the degree of cerebral volume loss. Patchy and confluent areas of decreased attenuation are noted throughout the deep and periventricular white matter of the cerebral hemispheres bilaterally, compatible with chronic microvascular ischemic disease. No evidence of large-territorial acute infarction. No parenchymal hemorrhage. No mass lesion. No extra-axial  collection. No mass effect or midline shift. No hydrocephalus. Basilar cisterns are patent. Vascular: No hyperdense vessel. Skull: No acute fracture or focal lesion. Sinuses/Orbits: Paranasal sinuses and mastoid air cells are clear. The orbits are unremarkable. Other: None. CT CERVICAL SPINE FINDINGS Alignment: Straightening of normal cervical lordosis likely due to positioning and degenerative changes. Grade 1 anterolisthesis of due foreign C5. Skull base and vertebrae: Multilevel at least moderate degenerative changes of the spine most prominent at the C6-C7 levels. No acute fracture. No aggressive appearing focal osseous lesion or focal pathologic process. Soft tissues and spinal canal: No prevertebral fluid or swelling. No visible canal hematoma. Upper chest: Similar-appearing right apical scarring. Other: Partially retropharyngeal right common carotid artery. IMPRESSION: 1. No acute intracranial abnormality. 2. No acute displaced fracture or traumatic listhesis of the cervical spine. Electronically Signed   By: Iven Finn M.D.   On: 12/09/2020 19:54   DG Chest Port 1 View  Result Date: 12/09/2020 CLINICAL DATA:  Syncopal episode, history of colon cancer scheduled for partial colectomy tomorrow, started bowel prep at this morning and was having persistent uncontrolled diarrhea. Past history hypertension, breast cancer EXAM: PORTABLE CHEST 1 VIEW COMPARISON:  Portable exam 2024 hours compared to 04/11/2014 FINDINGS: Normal heart size, mediastinal contours, and pulmonary vascularity. Atherosclerotic calcification aorta. Kyphotic positioning. Bibasilar atelectasis. Remaining lungs clear. No pleural effusion or pneumothorax. Bones demineralized. IMPRESSION: Bibasilar atelectasis. Aortic Atherosclerosis (ICD10-I70.0). Electronically Signed   By: Lavonia Dana M.D.   On: 12/09/2020 20:36     .Adin Hector, M.D., F.A.C.S. Gastrointestinal and Minimally Invasive Surgery [facility] [address] [place], [place] [address] [phone] Main / Paging  12/10/2020 7:21 AM    Adin Hector

## 2020-12-10 NOTE — Transfer of Care (Signed)
Immediate Anesthesia Transfer of Care Note  Patient: Colleen Hancock  Procedure(s) Performed: ROBOTIC PROXIMAL COLECTOMY WITH BILATERAL TAP BLOCK (Abdomen)  Patient Location: PACU  Anesthesia Type:General  Level of Consciousness: awake, alert , oriented and patient cooperative  Airway & Oxygen Therapy: Patient Spontanous Breathing and Patient connected to face mask oxygen  Post-op Assessment: Report given to RN, Post -op Vital signs reviewed and stable and Patient moving all extremities  Post vital signs: Reviewed and stable  Last Vitals:  Vitals Value Taken Time  BP 143/63 12/10/20 1003  Temp    Pulse 86 12/10/20 1006  Resp 20 12/10/20 1006  SpO2 100 % 12/10/20 1006  Vitals shown include unvalidated device data.  Last Pain:  Vitals:   12/10/20 0716  TempSrc:   PainSc: 0-No pain      Patients Stated Pain Goal: 4 (93/57/01 7793)  Complications: No notable events documented.

## 2020-12-10 NOTE — Anesthesia Procedure Notes (Addendum)
Procedure Name: Intubation Date/Time: 12/10/2020 7:35 AM Performed by: Victoriano Lain, CRNA Pre-anesthesia Checklist: Patient identified, Emergency Drugs available, Suction available, Patient being monitored and Timeout performed Patient Re-evaluated:Patient Re-evaluated prior to induction Oxygen Delivery Method: Circle system utilized Preoxygenation: Pre-oxygenation with 100% oxygen Induction Type: IV induction Ventilation: Mask ventilation without difficulty Laryngoscope Size: Mac and 4 Grade View: Grade I Tube type: Oral Tube size: 7.0 mm Number of attempts: 1 Airway Equipment and Method: Stylet Placement Confirmation: ETT inserted through vocal cords under direct vision, positive ETCO2 and breath sounds checked- equal and bilateral Secured at: 21 cm Tube secured with: Tape Dental Injury: Teeth and Oropharynx as per pre-operative assessment

## 2020-12-10 NOTE — Progress Notes (Addendum)
12/10/2020     REFERRING PHYSICIAN: Milus Banister, MD  Patient Care Team: Cleaster Corin, MD as PCP - General (Nephrology) Milus Banister, MD (Gastroenterology)  PROVIDER: Hollace Kinnier, MD  [facility] MRN: [id number] DOB: 09/18/1945   Subjective   Chief Complaint: New Patient   History of Present Illness: Colleen Hancock is a 75 y.o. female who is seen today as an office consultation at the request of Dr. Ardis Hughs for evaluation of New Patient .   Pleasant obese woman who had a positive Cologuard test and sent for colonoscopy. Has had prior colonoscopies for irregular bowels. Sounds like she has irritable bowel syndrome. She has moved around a fair amount due to being in an [employer] family. 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 On this colonoscopy however this time around there was a mass coming out of the appendiceal orifice. Dr. Ardis Hughs suspected polyp and did biopsies. 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 CT scan shows no obvious metastatic disease. Surgical consultation recommended. She comes a by herself but wants to have her daughter on the phone for discussion.  Patient has had cholecystectomy in 2008 when she lived in [place]. Had a C-section 1972. Has had breast biopsies as well as mastectomy last in 2018 by my partner Dr. Donne Hazel. One of her daughters lives with her. She was trying to listen on the phone but there was connection problems. Patient claims she can walk about 10 to 15 minutes before she has to stop. Does not smoke. No diabetes. Can do some moderate lifting and activity take care of her husband. No history heart attack or stroke.  She does have irregular bowels. 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 Usually managed with help of cholestyramine and Bentyl. Can move her bowels several times a day but is mostly controlled with that regimen. Some hypertension on lisinopril  Medical History: Past Medical  History:  Diagnosis Date   Arthritis   Asthma, unspecified asthma severity, unspecified whether complicated, unspecified whether persistent   GERD (gastroesophageal reflux disease)   History of cancer   Hyperlipidemia   Hypertension   There is no problem list on file for this patient.  Past Surgical History:  Procedure Laterality Date   breast removal surgery N/A   CESAREAN SECTION N/A   LAPAROSCOPIC CHOLECYSTECTOMY N/A    Allergies  Allergen Reactions   Acetaminophen Swelling   Fentanyl Other (See Comments)  Low blood pressure. Low blood pressure.   Tetracyclines Hives   Other Unknown  SEAFOOD > UNSPECIFIED REACTION   Sodium Lactate Diarrhea   Nsaids (Non-Steroidal Anti-Inflammatory Drug) Nausea And Vomiting  Stomach upset   Pentazocine Nausea And Vomiting  Talwin n/v tunnel vision   Shellfish Containing Products Diarrhea   Current Outpatient Medications on File Prior to Visit  Medication Sig Dispense Refill   cholestyramine (QUESTRAN) 4 gram oral powder packet   dicyclomine (BENTYL) 10 mg capsule   fluticasone propionate (FLONASE) 50 mcg/actuation nasal spray   lisinopriL (ZESTRIL) 20 MG tablet   traMADoL (ULTRAM) 50 mg tablet   atorvastatin (LIPITOR) 20 MG tablet atorvastatin 20 mg tablet   cetirizine (ZYRTEC) 10 mg capsule Zyrtec   omega 3-dha-epa-fish oil 183.3 mg-75 mg -91.6 mg-306 mg Cap omega 3 183.3 mg-dha 75 mg-epa 91.6 mg-fish oil 306 mg capsule Take by oral route.   No current facility-administered medications on file prior to visit.   Family History  Problem Relation Age of Onset  Obesity Mother   Hyperlipidemia (Elevated cholesterol) Father   Heart valve disease Father   Obesity Brother   Hyperlipidemia (Elevated cholesterol) Brother   Heart valve disease Brother    Social History   Tobacco Use  Smoking Status Never Smoker  Smokeless Tobacco Not on file    Social History   Socioeconomic History   Marital status: Married  Tobacco  Use   Smoking status: Never Smoker  Substance and Sexual Activity   Alcohol use: Yes   Drug use: Never   ############################################################  Fraility Risk:  Preoperative Risks/Screening 1. Frailty Review:  Lives independently? yes Uses a mobility assist device (cane/walker/wheelchair)? no History of falls within 3 months? no Cognitive impairment/dementia? no Age > 65? yes  2. Nutrition Screening: Cancer/IBD? yes Age >65? yes Weight loss >10% in past 6 months? yes If yes to any of the 3 above, consider Impact AR supplemental shake  3. PONV Screening: History of PONV? no Female under age of 70? no History of motion sickness? no  4. Chronic pain issues? no  5. Diabetes? no Last HgbA1c: No results found for: HGBA1C  Review of Systems: A complete review of systems (ROS) was obtained from the patient. I have reviewed this information and discussed as appropriate with the patient. See HPI as well for other pertinent ROS.  Constitutional: No fevers, chills, sweats. Weight stable Eyes: No vision changes, No discharge HENT: No sore throats, nasal drainage Lymph: No neck swelling, No bruising easily Pulmonary: No cough, productive sputum CV: No orthopnea, PND Patient walks 15 minutes for about 1/4 miles without difficulty. No exertional chest/neck/shoulder/arm pain.  GI: +IBS No personal nor family history of inflammatory bowel disease, allergy such as Celiac Sprue, dietary/dairy problems, colitis, ulcers nor gastritis. No recent sick contacts/gastroenteritis. No travel outside the country. No changes in diet.  Renal: No UTIs, No hematuria Genital: No drainage, bleeding, masses Musculoskeletal: No severe joint pain. Good ROM major joints Skin: No sores or lesions Heme/Lymph: No easy bleeding. No swollen lymph nodes  Objective:   Vitals:  11/16/20 1441  BP: (!) 158/72  Pulse: 104  Temp: 37 C (98.6 F)  SpO2: 98%  Weight: 92.1 kg (203 lb)   Height: 160.7 cm (5' 3.25")    Body mass index is 35.68 kg/m.  PHYSICAL EXAM:  Constitutional: Not cachectic. Hygeine adequate. Vitals signs as above.  Eyes: Pupils reactive, normal extraocular movements. Sclera nonicteric Neuro: CN II-XII intact. No major focal sensory defects. No major motor deficits. Lymph: No head/neck/groin lymphadenopathy Psych: No severe agitation. No severe anxiety. Judgment & insight Adequate, Oriented x4, HENT: Normocephalic, Mucus membranes moist. No thrush.  Neck: Supple, No tracheal deviation. No obvious thyromegaly Chest: No pain to chest wall compression. Good respiratory excursion. No audible wheezing CV: Pulses intact. Regular rhythm. No major extremity edema  Abdomen: Obese Hernia: Not present. Diastasis recti: Mild supraumbilical midline. Soft. Nondistended. Nontender. No hepatomegaly. No splenomegaly  Gen: Inguinal hernia: Not present. Inguinal lymph nodes: without lymphadenopathy.   Rectal: (Deferred)  Ext: No obvious deformity or contracture. Edema: Not present. No cyanosis Skin: No major subcutaneous nodules. Warm and dry Musculoskeletal: Severe joint rigidity not present. No obvious clubbing. No digital petechiae.   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 - SEE MICROSCOPIC DESCRIPTION. Microscopic Comment Multiple biopsy fragments are involved by carcinoma including the edge of some of the fragments. Dr. Vic Ripper agrees. Called to Dr. Ardis Hughs on 10/26/20. (JDP:kh 10/26/20) Claudette Laws MD Pathologist, Electronic Signature (Case signed 10/27/2020) Assessment and Plan:  DIAGNOSES:  Diagnoses and all  orders for this visit:  Malignant neoplasm of cecum (CMS-HCC)  Obesity, Class II, BMI 35-39.9, unspecified  Irritable bowel syndrome with diarrhea  Caregiver stress    ASSESSMENT/PLAN  Pleasant active woman with cancer coming out of appendiceal orifice into cecum. No frank evidence of metastatic disease aside from less than 5 mm lung nodules x2 hopefully nonspecific.  Standard of care is segmental colonic resection. Reasonable candidate for a robotic resection with intracorporeal anastomosis. Colon pathway.  The anatomy & physiology of the digestive tract was discussed. The pathophysiology of the colon was discussed. Natural history risks without surgery was discussed. I feel the risks of no intervention will lead to serious problems that outweigh the operative risks; therefore, I recommended a partial colectomy to remove the pathology. Minimally invasive (Robotic/Laparoscopic) & open techniques were discussed.   Risks such as bleeding, infection, abscess, leak, reoperation, injury to other organs, need for repair of tissues / organs, possible ostomy, hernia, heart attack, stroke, death, and other risks were discussed. I noted a good likelihood this will help address the problem. Goals of post-operative recovery were discussed as well. Need for adequate nutrition, daily bowel regimen and healthy physical activity, to optimize recovery was noted as well. We will work to minimize complications. Educational materials were available as well. Questions were answered. The patient expresses understanding & wishes to proceed with surgery.  She had an underwhelming exercise tolerance test 5 years ago. 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 Has moderate lifting and other activity requirements. I do not feel strongly we need to repeat cardiac clearance.  I did caution she will need at least a couple days to be in the hospital in many weeks to recover after surgery. She is working with her  daughter to try and arrange support for her husband through the [place] hospital and family. Hopefully I can happen.  She is concerned about how her diarrhea will work out after surgery. I recommend she stay on her antidiarrheal regimen for now. Consider adding a fiber supplement. We will have to follow as this goes. Does not change the need for an appropriate cancer resection of colon cancer. She may need extra antidiarrheals or medicines. We will see.  The lung nodule seem small and underwhelming. Will defer to her oncologist, Dr. Lindi Adie. Perhaps just a 3-78-month follow-up study to prove stability is all that is needed.  Addendum: Patient felt lightheaded and dizzy with syncopal event with bowel prep.  Struggles with IBS.  Feeling better now after rehydration.  Work-up showing no neurological or cardiac events.  Ready proceed with surgery to resect cecal cancer.  ########################################################  Adin Hector, MD, FACS, MASCRS   Adin Hector, MD, FACS, MASCRS Esophageal, Gastrointestinal & Colorectal Surgery Robotic and Minimally Invasive Surgery  [facility], [place]  [place]. [address], [place], [place] [address] [phone] Fax [phone] Main  CONTACT INFORMATION:  Weekday (9AM-5PM): Call [facility] main office at [phone]  Weeknight (5PM-9AM) or Weekend/Holiday: Check [email or web address] (password " [email or web address]") for General Surgery [facility] coverage  (Please, do not use SecureChat as it is not  reliable communication to operating surgeons for immediate patient care)

## 2020-12-10 NOTE — Anesthesia Postprocedure Evaluation (Signed)
Anesthesia Post Note  Patient: Colleen Hancock  Procedure(s) Performed: ROBOTIC PROXIMAL COLECTOMY WITH BILATERAL TAP BLOCK (Abdomen)     Patient location during evaluation: PACU Anesthesia Type: General Level of consciousness: awake and alert Pain management: pain level controlled Vital Signs Assessment: post-procedure vital signs reviewed and stable Respiratory status: spontaneous breathing, nonlabored ventilation, respiratory function stable and patient connected to nasal cannula oxygen Cardiovascular status: blood pressure returned to baseline and stable Postop Assessment: no apparent nausea or vomiting Anesthetic complications: no   No notable events documented.  Last Vitals:  Vitals:   12/10/20 1045 12/10/20 1100  BP: (!) 146/59 (!) 132/59  Pulse: 80 77  Resp: 15 16  Temp:  (!) 36.1 C  SpO2: 100% 100%    Last Pain:  Vitals:   12/10/20 1100  TempSrc:   PainSc: Asleep                 Belenda Cruise P Rein Popov

## 2020-12-10 NOTE — Discharge Instructions (Addendum)
SURGERY: POST OP INSTRUCTIONS (Surgery for small bowel obstruction, colon resection, etc)   ######################################################################  EAT Gradually transition to a high fiber diet with a fiber supplement over the next few days after discharge  WALK Walk an hour a day.  Control your pain to do that.    CONTROL PAIN Control pain so that you can walk, sleep, tolerate sneezing/coughing, go up/down stairs.  HAVE A BOWEL MOVEMENT DAILY Keep your bowels regular to avoid problems.  OK to try a laxative to override constipation.  OK to use an antidairrheal to slow down diarrhea.  Call if not better after 2 tries  CALL IF YOU HAVE PROBLEMS/CONCERNS Call if you are still struggling despite following these instructions. Call if you have concerns not answered by these instructions  ######################################################################   DIET Follow a light diet the first few days at home.  Start with a bland diet such as soups, liquids, starchy foods, low fat foods, etc.  If you feel full, bloated, or constipated, stay on a ful liquid or pureed/blenderized diet for a few days until you feel better and no longer constipated. Be sure to drink plenty of fluids every day to avoid getting dehydrated (feeling dizzy, not urinating, etc.). Gradually add a fiber supplement to your diet over the next week.  Gradually get back to a regular solid diet.  Avoid fast food or heavy meals the first week as you are more likely to get nauseated. It is expected for your digestive tract to need a few months to get back to normal.  It is common for your bowel movements and stools to be irregular.  You will have occasional bloating and cramping that should eventually fade away.  Until you are eating solid food normally, off all pain medications, and back to regular activities; your bowels will not be normal. Focus on eating a low-fat, high fiber diet the rest of your life  (See Getting to [place], below).  CARE of your INCISION or WOUND It is good for closed incision and even open wounds to be washed every day.  Shower every day.  Short baths are fine.  Wash the incisions and wounds clean with soap & water.     If you have a closed incision(s), wash the incision with soap & water every day.  You may leave closed incisions open to air if it is dry.   You may cover the incision with clean gauze & replace it after your daily shower for comfort.  It is good for closed incisions and even open wounds to be washed every day.  Shower every day.  Short baths are fine.  Wash the incisions and wounds clean with soap & water.    You may leave closed incisions open to air if it is dry.   You may cover the incision with clean gauze & replace it after your daily shower for comfort.  TEGADERM:  You have clear gauze band-aid dressings over your closed incision(s).  Remove the dressings 3 days after surgery.   If you have an open wound with a wound vac, see wound vac care instructions.     ACTIVITIES as tolerated Start light daily activities --- self-care, walking, climbing stairs-- beginning the day after surgery.  Gradually increase activities as tolerated.  Control your pain to be active.  Stop when you are tired.  Ideally, walk several times a day, eventually an hour a day.   Most people are back to most day-to-day activities in  a few weeks.  It takes 4-8 weeks to get back to unrestricted, intense activity. If you can walk 30 minutes without difficulty, it is safe to try more intense activity such as jogging, treadmill, bicycling, low-impact aerobics, swimming, etc. Save the most intensive and strenuous activity for last (Usually 4-8 weeks after surgery) such as sit-ups, heavy lifting, contact sports, etc.  Refrain from any intense heavy lifting or straining until you are off narcotics for pain control.  You will have off days, but things should improve  week-by-week. DO NOT PUSH THROUGH PAIN.  Let pain be your guide: If it hurts to do something, don't do it.  Pain is your body warning you to avoid that activity for another week until the pain goes down. You may drive when you are no longer taking narcotic prescription pain medication, you can comfortably wear a seatbelt, and you can safely make sudden turns/stops to protect yourself without hesitating due to pain. You may have sexual intercourse when it is comfortable. If it hurts to do something, stop.  MEDICATIONS Take your usually prescribed home medications unless otherwise directed.   Blood thinners:  Usually you can restart any strong blood thinners after the second postoperative day.  It is OK to take aspirin right away.     If you are on strong blood thinners (warfarin/Coumadin, Plavix, Xerelto, Eliquis, Pradaxa, etc), discuss with your surgeon, medicine PCP, and/or cardiologist for instructions on when to restart the blood thinner & if blood monitoring is needed (PT/INR blood check, etc).     PAIN CONTROL Pain after surgery or related to activity is often due to strain/injury to muscle, tendon, nerves and/or incisions.  This pain is usually short-term and will improve in a few months.  To help speed the process of healing and to get back to regular activity more quickly, DO THE FOLLOWING THINGS TOGETHER: Increase activity gradually.  DO NOT PUSH THROUGH PAIN Use Ice and/or Heat Try Gentle Massage and/or Stretching Take over the counter pain medication Take Narcotic prescription pain medication for more severe pain  Good pain control = faster recovery.  It is better to take more medicine to be more active than to stay in bed all day to avoid medications.  Increase activity gradually Avoid heavy lifting at first, then increase to lifting as tolerated over the next 6 weeks. Do not "push through" the pain.  Listen to your body and avoid positions and maneuvers than reproduce the pain.   Wait a few days before trying something more intense Walking an hour a day is encouraged to help your body recover faster and more safely.  Start slowly and stop when getting sore.  If you can walk 30 minutes without stopping or pain, you can try more intense activity (running, jogging, aerobics, cycling, swimming, treadmill, sex, sports, weightlifting, etc.) Remember: If it hurts to do it, then don't do it! Use Ice and/or Heat You will have swelling and bruising around the incisions.  This will take several weeks to resolve. Ice packs or heating pads (6-8 times a day, 30-60 minutes at a time) will help sooth soreness & bruising. Some people prefer to use ice alone, heat alone, or alternate between ice & heat.  Experiment and see what works best for you.  Consider trying ice for the first few days to help decrease swelling and bruising; then, switch to heat to help relax sore spots and speed recovery. Shower every day.  Short baths are fine.  It feels good!  Keep the incisions and wounds clean with soap & water.   Try Gentle Massage and/or Stretching Massage at the area of pain many times a day Stop if you feel pain - do not overdo it Take over the counter pain medication This helps the muscle and nerve tissues become less irritable and calm down faster Choose ONE of the following over-the-counter anti-inflammatory medications: Acetaminophen 578m tabs (Tylenol) 1-2 pills with every meal and just before bedtime (avoid if you have liver problems or if you have acetaminophen in you narcotic prescription) Naproxen 227mtabs (ex. Aleve, Naprosyn) 1-2 pills twice a day (avoid if you have kidney, stomach, IBD, or bleeding problems) Ibuprofen 20073mabs (ex. Advil, Motrin) 3-4 pills with every meal and just before bedtime (avoid if you have kidney, stomach, IBD, or bleeding problems) Take with food/snack several times a day as directed for at least 2 weeks to help keep pain / soreness down & more  manageable. Take Narcotic prescription pain medication for more severe pain A prescription for strong pain control is often given to you upon discharge (for example: oxycodone/Percocet, hydrocodone/Norco/Vicodin, or tramadol/Ultram) Take your pain medication as prescribed. Be mindful that most narcotic prescriptions contain Tylenol (acetaminophen) as well - avoid taking too much Tylenol. If you are having problems/concerns with the prescription medicine (does not control pain, nausea, vomiting, rash, itching, etc.), please call us [phone] see if we need to switch you to a different pain medicine that will work better for you and/or control your side effects better. If you need a refill on your pain medication, you must call the office before 4 pm and on weekdays only.  By federal law, prescriptions for narcotics cannot be called into a pharmacy.  They must be filled out on paper & picked up from our office by the patient or authorized caretaker.  Prescriptions cannot be filled after 4 pm nor on weekends.    WHEN TO CALL US [phone] uncontrolled or worsening pain  Fever over 101 F (38.5 C) Concerns with the incision: Worsening pain, redness, rash/hives, swelling, bleeding, or drainage Reactions / problems with new medications (itching, rash, hives, nausea, etc.) Nausea and/or vomiting Difficulty urinating Difficulty breathing Worsening fatigue, dizziness, lightheadedness, blurred vision Other concerns If you are not getting better after two weeks or are noticing you are getting worse, contact our office [phone] for further advice.  We may need to adjust your medications, re-evaluate you in the office, send you to the emergency room, or see what other things we can do to help. The clinic staff is available to answer your questions during regular business hours (8:30am-5pm).  Please don't hesitate to call and ask to speak to one of our nurses for clinical concerns.    A  surgeon from [facility] is always on call at the hospitals 24 hours/day If you have a medical emergency, go to the nearest emergency room or call 911.  FOLLOW UP in our office One the day of your discharge from the hospital (or the next business weekday), please call [facility] to set up or confirm an appointment to see your surgeon in the office for a follow-up appointment.  Usually it is 2-3 weeks after your surgery.   If you have skin staples at your incision(s), let the office know so we can set up a time in the office for the nurse to remove them (usually around 10 days after surgery). Make sure that you call for  appointments the day of discharge (or the next business weekday) from the hospital to ensure a convenient appointment time. IF YOU HAVE DISABILITY OR FAMILY LEAVE FORMS, BRING THEM TO THE OFFICE FOR PROCESSING.  DO NOT GIVE THEM TO YOUR DOCTOR.  [facility] [address] [place], [place], [place]  [address] ? [phone] - Main [phone] - [place],  [phone] - Fax [email or web address]  GETTING TO GOOD BOWEL HEALTH. It is expected for your digestive tract to need a few months to get back to normal.  It is common for your bowel movements and stools to be irregular.  You will have occasional bloating and cramping that should eventually fade away.  Until you are eating solid food normally, off all pain medications, and back to regular activities; your bowels will not be normal.   Avoiding constipation The goal: ONE SOFT BOWEL MOVEMENT A DAY!    Drink plenty of fluids.  Choose water first. TAKE A FIBER SUPPLEMENT EVERY DAY THE REST OF YOUR LIFE During your first week back home, gradually add back a fiber supplement every day Experiment which form you can tolerate.   There are many forms such as powders, tablets, wafers, gummies, etc Psyllium bran (Metamucil), methylcellulose (Citrucel), Miralax or Glycolax,  Benefiber, Flax Seed.  Adjust the dose week-by-week (1/2 dose/day to 6 doses a day) until you are moving your bowels 1-2 times a day.  Cut back the dose or try a different fiber product if it is giving you problems such as diarrhea or bloating. Sometimes a laxative is needed to help jump-start bowels if constipated until the fiber supplement can help regulate your bowels.  If you are tolerating eating & you are farting, it is okay to try a gentle laxative such as double dose MiraLax, prune juice, or Milk of Magnesia.  Avoid using laxatives too often. Stool softeners can sometimes help counteract the constipating effects of narcotic pain medicines.  It can also cause diarrhea, so avoid using for too long. If you are still constipated despite taking fiber daily, eating solids, and a few doses of laxatives, call our office. 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  Goal is less than 3 bowel movements a day ideally.  This will take several months to naturally happen.  Continue your home dose cholestyramine and Bentyl in the morning.  Take a fiber supplement twice a day to thicken up your stools.  Consider FiberCon tablet.  Continue taking iron for this first month twice a day.  It will naturally constipate her bowels.  Take Lomotil.  1 pill twice a day.  Take extra pills (up to 8 pills a day) until your bowels are more slow down.  Try drinking liquids and eating bland foods for a few days to avoid stressing your intestines further. Avoid dairy products (especially milk & ice cream) for a short time.  The intestines often can lose the ability to digest lactose when stressed. Avoid foods that cause gassiness or bloating.  Typical foods include beans and other legumes, cabbage, broccoli, and dairy foods.  Avoid greasy, spicy, fast foods.  Every person  has some sensitivity to other foods, so listen to your body and avoid those foods that trigger problems for you.  It is okay to try and slow down diarrhea with a few doses of antidiarrheal medicines.  Bismuth subsalicylate (ex. Kayopectate, Pepto Bismol) for a few doses can help control diarrhea.  Avoid if pregnant.   Loperamide (Imodium) can slow down diarrhea.  Start with one tablet (2mg ) first.  Avoid if you are having fevers or severe pain.  . TROUBLESHOOTING IRREGULAR BOWELS 1) Start with a soft & bland diet. No spicy, greasy, or fried foods.  2) Avoid gluten/wheat or dairy products from diet to see if symptoms improve. 3) Miralax 17gm or flax seed mixed in [place]. water or juice-daily. May use 2-4 times a day as needed. 4) Gas-X, Phazyme, etc. as needed for gas & bloating.  5) Prilosec (omeprazole) over-the-counter as needed 6)  Consider probiotics (Align, Activa, etc) to help calm the bowels down  Call your doctor if you are getting worse or not getting better.  Sometimes further testing (cultures, endoscopy, X-ray studies, CT scans, bloodwork, etc.) may be needed to help diagnose and treat the cause of the diarrhea. [facility], [place], [place], [place], [place]  [address] [phone] - Main.    [phone]  - Toll Free.   [phone] - Fax [email or web address]

## 2020-12-10 NOTE — Anesthesia Preprocedure Evaluation (Addendum)
Anesthesia Evaluation  Patient identified by MRN, date of birth, ID band Patient awake    Reviewed: Allergy & Precautions, NPO status , Patient's Chart, lab work & pertinent test results  Airway Mallampati: II  TM Distance: >3 FB Neck ROM: Full    Dental  (+) Chipped, Poor Dentition,    Pulmonary asthma ,  COPD inhaler,    Pulmonary exam normal        Cardiovascular hypertension, Pt. on medications  Rhythm:Regular Rate:Normal     Neuro/Psych Anxiety negative neurological ROS     GI/Hepatic Neg liver ROS, GERD  ,Diverticulitis    Endo/Other  negative endocrine ROS  Renal/GU negative Renal ROS  negative genitourinary   Musculoskeletal  (+) Arthritis , Osteoarthritis,    Abdominal (+)  Abdomen: soft.    Peds  Hematology negative hematology ROS (+)   Anesthesia Other Findings   Reproductive/Obstetrics                            Anesthesia Physical Anesthesia Plan  ASA: 2  Anesthesia Plan: General   Post-op Pain Management:    Induction: Intravenous  PONV Risk Score and Plan: 3 and Ondansetron, Dexamethasone and Treatment may vary due to age or medical condition  Airway Management Planned: Mask and Oral ETT  Additional Equipment: None  Intra-op Plan:   Post-operative Plan: Extubation in OR  Informed Consent: I have reviewed the patients History and Physical, chart, labs and discussed the procedure including the risks, benefits and alternatives for the proposed anesthesia with the patient or authorized representative who has indicated his/her understanding and acceptance.     Dental advisory given  Plan Discussed with: CRNA  Anesthesia Plan Comments: (Lab Results      Component                Value               Date                      WBC                      9.1                 12/09/2020                HGB                      13.4                12/09/2020                 HCT                      40.2                12/09/2020                MCV                      98.5                12/09/2020                PLT                      168  12/09/2020           Lab Results      Component                Value               Date                      NA                       133 (L)             12/09/2020                K                        3.7                 12/09/2020                CO2                      19 (L)              12/09/2020                GLUCOSE                  134 (H)             12/09/2020                BUN                      17                  12/09/2020                CREATININE               1.22 (H)            12/09/2020                CALCIUM                  9.7                 12/09/2020                GFRNONAA                 46 (L)              12/09/2020          )        Anesthesia Quick Evaluation

## 2020-12-11 ENCOUNTER — Encounter: Payer: Self-pay | Admitting: Surgery

## 2020-12-11 DIAGNOSIS — N182 Chronic kidney disease, stage 2 (mild): Secondary | ICD-10-CM

## 2020-12-11 DIAGNOSIS — Z8711 Personal history of peptic ulcer disease: Secondary | ICD-10-CM

## 2020-12-11 DIAGNOSIS — K219 Gastro-esophageal reflux disease without esophagitis: Secondary | ICD-10-CM | POA: Insufficient documentation

## 2020-12-11 LAB — BASIC METABOLIC PANEL
Anion gap: 7 (ref 5–15)
BUN: 12 mg/dL (ref 8–23)
CO2: 24 mmol/L (ref 22–32)
Calcium: 8.9 mg/dL (ref 8.9–10.3)
Chloride: 106 mmol/L (ref 98–111)
Creatinine, Ser: 0.92 mg/dL (ref 0.44–1.00)
GFR, Estimated: >60 mL/min (ref >60)
Glucose, Bld: 139 mg/dL — ABNORMAL HIGH (ref 70–99)
Potassium: 4.7 mmol/L (ref 3.5–5.1)
Sodium: 137 mmol/L (ref 135–145)

## 2020-12-11 LAB — CBC
HCT: 34.7 % — ABNORMAL LOW (ref 36.0–46.0)
Hemoglobin: 11.9 g/dL — ABNORMAL LOW (ref 12.0–15.0)
MCH: 33.2 pg (ref 26.0–34.0)
MCHC: 34.3 g/dL (ref 30.0–36.0)
MCV: 96.9 fL (ref 80.0–100.0)
Platelets: 131 10*3/uL — ABNORMAL LOW (ref 150–400)
RBC: 3.58 MIL/uL — ABNORMAL LOW (ref 3.87–5.11)
RDW: 13 % (ref 11.5–15.5)
WBC: 9.8 10*3/uL (ref 4.0–10.5)
nRBC: 0 % (ref 0.0–0.2)

## 2020-12-11 LAB — MAGNESIUM: Magnesium: 2.2 mg/dL (ref 1.7–2.4)

## 2020-12-11 LAB — PHOSPHORUS: Phosphorus: 3.1 mg/dL (ref 2.5–4.6)

## 2020-12-11 MED ORDER — ACETAMINOPHEN 500 MG PO TABS
1000.0000 mg | ORAL_TABLET | Freq: Three times a day (TID) | ORAL | Status: DC
Start: 2020-12-11 — End: 2020-12-16
  Administered 2020-12-11 – 2020-12-16 (×17): 1000 mg via ORAL
  Filled 2020-12-11 (×17): qty 2

## 2020-12-11 MED ORDER — TRAMADOL HCL 50 MG PO TABS
100.0000 mg | ORAL_TABLET | Freq: Four times a day (QID) | ORAL | Status: DC | PRN
  Administered 2020-12-12 – 2020-12-15 (×8): 100 mg via ORAL
  Filled 2020-12-11 (×9): qty 2

## 2020-12-11 MED ORDER — GABAPENTIN 100 MG PO CAPS
200.0000 mg | ORAL_CAPSULE | Freq: Three times a day (TID) | ORAL | Status: DC
  Administered 2020-12-11 – 2020-12-13 (×9): 200 mg via ORAL
  Filled 2020-12-11 (×9): qty 2

## 2020-12-11 NOTE — Evaluation (Signed)
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 PMH includes breast CA, arthritis, HTN  Clinical Impression  Pt admitted with above diagnosis. Pt independent at baseline, cares for disabled spouse. Pt currently requiring min guard with mobility, reports "out of it" due to pain medication. Pt ambulates around room and to restroom, able to complete toileting with supv. Pt returns to supine with slightly increased difficulty performing log roll, but able to complete without physical assistance. Pt agreeable to HHPT to regain strength; son and daughter assisting to care for disabled spouse while pt recovers. Pt currently with functional limitations due to the deficits listed below (see PT Problem List). Pt will benefit from skilled PT to increase their independence and safety with mobility to allow discharge to the venue listed below.          Recommendations for follow up therapy are one component of a multi-disciplinary discharge planning process, led by the attending physician.  Recommendations may be updated based on patient status, additional functional criteria and insurance authorization.  Follow Up Recommendations Home health PT    Equipment Recommendations  None recommended by PT    Recommendations for Other Services       Precautions / Restrictions Precautions Precautions: Fall Precaution Comments: abd sx Restrictions Weight Bearing Restrictions: No      Mobility  Bed Mobility Overal bed mobility: Needs Assistance Bed Mobility: Rolling;Sidelying to Sit;Sit to Supine Rolling: Supervision Sidelying to sit: Supervision;HOB elevated  Sit to supine: Min guard   General bed mobility comments: VCs for log rolling with supine<>sit, slightly more difficult when returning to supine but able to  complete with bedrail and no physical assist    Transfers Overall transfer level: Needs assistance Equipment used: Rolling walker (2 wheeled) Transfers: Sit to/from Stand Sit to Stand: Min guard  General transfer comment: weight slightly posterior causing return to sitting with initial STS rep, following STS reps with min guard from bed with RW and from toilet with railing  Ambulation/Gait Ambulation/Gait assistance: Min guard Gait Distance (Feet): 30 Feet Assistive device: Rolling walker (2 wheeled) Gait Pattern/deviations: Step-through pattern;Decreased stride length Gait velocity: decreased   General Gait Details: pt ambulates throughout room with RW, initially slightly "dizzy" due to pain medication, no LOB, decreased cadence  Stairs            Wheelchair Mobility    Modified Rankin (Stroke Patients Only)       Balance Overall balance assessment: Needs assistance Sitting-balance support: Feet supported Sitting balance-Leahy Scale: Good Sitting balance - Comments: seated EOB   Standing balance support: No upper extremity supported Standing balance-Leahy Scale: Fair Standing balance comment: static without UE support, dynamic with RW         Pertinent Vitals/Pain Pain Assessment: Faces Faces Pain Scale: Hurts a little bit Pain Location: lower abdomen Pain Descriptors / Indicators: Sore;Aching Pain Intervention(s): Limited activity within patient's tolerance;Monitored during session;Repositioned    Home Living Family/patient expects to be discharged to:: Private residence Living Arrangements: Spouse/significant other;Children Available Help at Discharge: Family;Available PRN/intermittently (spouse is disabled and DTR works) Type of Home: House Home Access: [place]: One [place]: Grab bars - tub/shower;Grab bars - toilet;Walker - 2 wheels;Cane - single point;Walker - 4 wheels;Wheelchair - manual;Shower seat;Bedside  commode;Other (comment) (puts 3 in 1 over toilet)  Additional Comments: disabled spouse and daughter. son came in from out of state to assist with disabled spouse for 2 weeks    Prior Function Level of Independence: Independent  Comments: Pt reports being independent without AD, assists disabled spouse with transfers to/from w/c     Hand Dominance   Dominant Hand: Right    Extremity/Trunk Assessment   Upper Extremity Assessment Upper Extremity Assessment: Defer to OT evaluation    Lower Extremity Assessment Lower Extremity Assessment: Overall WFL for tasks assessed;RLE deficits/detail;LLE deficits/detail RLE Deficits / Details: AROM WNL, strength grossly 4-/5, denies numbness/tingling throughout RLE Sensation: WNL RLE Coordination: WNL LLE Deficits / Details: AROM WNL, strength grossly 4-/5, denies numbness/tingling throughout LLE Sensation: WNL LLE Coordination: WNL    Cervical / Trunk Assessment Cervical / Trunk Assessment: Normal  Communication   Communication: No difficulties  Cognition Arousal/Alertness: Awake/alert Behavior During Therapy: WFL for tasks assessed/performed Overall Cognitive Status: Within Functional Limits for tasks assessed  General Comments: Pt reports feeling "out of it" from the pain medication, but follows commands and appropriate throughout session      General Comments General comments (skin integrity, edema, etc.): VSS on room air    Exercises     Assessment/Plan    PT Assessment Patient needs continued PT services  PT Problem List Decreased activity tolerance;Decreased balance;Decreased mobility;Decreased knowledge of use of DME;Pain;Obesity       PT Treatment Interventions DME instruction;Gait training;Functional mobility training;Therapeutic activities;Therapeutic exercise;Balance training;Patient/family education    PT Goals (Current goals can be found in the Care Plan section)  Acute Rehab PT Goals Patient Stated Goal: get  stronger PT Goal Formulation: With patient Time For Goal Achievement: 12/25/20 Potential to Achieve Goals: Good    Frequency Min 3X/week   Barriers to discharge        Co-evaluation               AM-PAC PT "6 Clicks" Mobility  Outcome Measure Help needed turning from your back to your side while in a flat bed without using bedrails?: A Little Help needed moving from lying on your back to sitting on the side of a flat bed without using bedrails?: A Little Help needed moving to and from a bed to a chair (including a wheelchair)?: A Little Help needed standing up from a chair using your arms (e.g., wheelchair or bedside chair)?: A Little Help needed to walk in hospital room?: A Little Help needed climbing 3-5 steps with a railing? : A Little 6 Click Score: 18    End of Session Equipment Utilized During Treatment: Gait belt Activity Tolerance: Patient tolerated treatment well Patient left: in bed;with call bell/phone within reach;with nursing/sitter in room Nurse Communication: Mobility status PT Visit Diagnosis: Other abnormalities of gait and mobility (R26.89);Pain Pain - part of body:  (abdomen)    Time: 8101-7510 PT Time Calculation (min) (ACUTE ONLY): 19 min   Charges:   PT Evaluation $PT Eval Low Complexity: 1 Low           Tori Ieesha Abbasi PT, DPT 12/11/20, 12:19 PM

## 2020-12-11 NOTE — Evaluation (Signed)
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 PMH includes breast CA, arthritis   Clinical Impression   Patient lives at home with spouse and daughter in a single level home that is wheelchair accessible. Patient's spouse is disabled and in wheelchair, reports she does a lot of lifting and assisting spouse. She is aware she cannot be lifting/straining after surgery, son came in from out of town for 2 weeks to assist then plans to have [facility] try and provide some home aide assist after this. Patient's daughter works and is out of the house until late in the day. Currently patient is overall min G assist for safety and needing cues for hand placement during functional transfers not to pull on walker. Patient needing increased assist for lower body ADLs due to abdominal soreness and decreased activity tolerance. Patient will benefit from continued acute OT services to maximize patient safety and independence with self care in order to D/C home. Declined HH services "I've had so many therapists in and out of the house, I think I will be fine."       Recommendations for follow up therapy are one component of a multi-disciplinary discharge planning process, led by the attending physician.  Recommendations may be updated based on patient status, additional functional criteria and insurance authorization.   Follow Up Recommendations  No OT follow up;Other (comment) (reports having "so much HH therapy in the past" does not want at D/C)    Equipment Recommendations  Other (comment) (LH AE)       Precautions / Restrictions Precautions Precautions: Fall Precaution Comments: abd sx Restrictions Weight Bearing Restrictions: No      Mobility Bed Mobility Overal bed mobility: Needs Assistance Bed Mobility:  Rolling;Sidelying to Sit Rolling: Supervision Sidelying to sit: Min guard;HOB elevated       General bed mobility comments: Cues in log roll technique to minimize abdominal pain/strain, also have patient use pillow to brace with.    Transfers Overall transfer level: Needs assistance Equipment used: Rolling walker (2 wheeled) Transfers: Sit to/from Stand Sit to Stand: Min guard         General transfer comment: Verbal cues for hand placement not to pull on walker, min G for safety. Does need increased time to power up to standing from edge of bed    Balance Overall balance assessment: Needs assistance Sitting-balance support: Feet supported Sitting balance-Leahy Scale: Fair     Standing balance support: No upper extremity supported Standing balance-Leahy Scale: Fair Standing balance comment: standing at sink without UE support                           ADL either performed or assessed with clinical judgement   ADL Overall ADL's : Needs assistance/impaired Eating/Feeding: Independent;Sitting   Grooming: Wash/dry hands;Supervision/safety;Standing Grooming Details (indicate cue type and reason): Patient reports feeling weak standing at sink, close supervision for safety Upper Body Bathing: Set up;Sitting   Lower Body Bathing: Moderate assistance;Sit to/from stand;Sitting/lateral leans   Upper Body Dressing : Set up;Sitting   Lower Body Dressing: Moderate assistance;Sitting/lateral leans;Sit to/from stand Lower Body Dressing Details (indicate cue type and reason): due to abdominal pain, may benefit from [facility] AE during recovery Toilet Transfer: Min guard;Cueing for safety;Ambulation;Regular Toilet;RW;Grab bars Toilet Transfer Details (indicate cue type and reason):  Does not need assist to power up to standing from edge of bed or toilet, needs verbal cues for hand placement not to pull on walker. Toileting- Clothing Manipulation and Hygiene: Set up;Sitting/lateral  lean Toileting - Clothing Manipulation Details (indicate cue type and reason): Peri care in sitting on toilet after voiding     Functional mobility during ADLs: Min guard;Rolling walker;Cueing for safety General ADL Comments: Patient presenting with decreased activity tolerance and abdominal pain impacting overall independence with self care and functional mobility     Vision Baseline Vision/History: 1 Wears glasses              Pertinent Vitals/Pain Pain Assessment: Faces Faces Pain Scale: Hurts little more Pain Location: lower abdomen Pain Descriptors / Indicators: Sore;Radiating Pain Intervention(s): Premedicated before session;Limited activity within patient's tolerance     Hand Dominance Right   Extremity/Trunk Assessment Upper Extremity Assessment Upper Extremity Assessment: Overall WFL for tasks assessed   Lower Extremity Assessment Lower Extremity Assessment: Defer to PT evaluation       Communication Communication Communication: No difficulties   Cognition Arousal/Alertness: Awake/alert Behavior During Therapy: WFL for tasks assessed/performed Overall Cognitive Status: Within Functional Limits for tasks assessed                                     General Comments  VSS on room air            Home Living Family/patient expects to be discharged to:: Private residence Living Arrangements: Spouse/significant other;Children Available Help at Discharge: Family;Available PRN/intermittently (spouse is disabled and DTR works) Type of Home: House Home Access: [place]: One level     Bathroom Shower/Tub: Occupational psychologist: Handicapped height     [place]: Grab bars - tub/shower;Grab bars - toilet;Walker - 2 wheels;Cane - single point;Walker - 4 wheels;Wheelchair - manual;Shower seat;Bedside commode;Other (comment) (puts 3 in 1 over toilet)   Additional Comments: disabled spouse and daughter. son  came in from out of state to assist with disabled spouse for 2 weeks      Prior Functioning/Environment Level of Independence: Independent        Comments: patient reports she has to help lift spouse due to him being disabled        OT Problem List: Decreased activity tolerance;Impaired balance (sitting and/or standing);Decreased safety awareness;Decreased knowledge of use of DME or AE;Decreased knowledge of precautions;Pain;Obesity      OT Treatment/Interventions: Self-care/ADL training;DME and/or AE instruction;Therapeutic activities;Patient/family education;Balance training    OT Goals(Current goals can be found in the care plan section) Acute Rehab OT Goals Patient Stated Goal: get stronger OT Goal Formulation: With patient Time For Goal Achievement: 12/25/20 Potential to Achieve Goals: Good  OT Frequency: Min 2X/week    AM-PAC OT "6 Clicks" Daily Activity     Outcome Measure Help from another person eating meals?: None Help from another person taking care of personal grooming?: A Little Help from another person toileting, which includes using toliet, bedpan, or urinal?: A Little Help from another person bathing (including washing, rinsing, drying)?: A Lot Help from another person to put on and taking off regular upper body clothing?: A Little Help from another person to put on and taking off regular lower body clothing?: A Lot 6 Click Score: 17   End of Session Equipment Utilized During Treatment: Rolling walker Nurse Communication: Mobility status  Activity Tolerance: Patient tolerated treatment well Patient left: in chair;with call bell/phone within reach  OT Visit Diagnosis: Other abnormalities of gait and mobility (R26.89);Unsteadiness on feet (R26.81);Pain Pain - part of body:  (abdomen)                Time: 7092-9574 OT Time Calculation (min): 33 min Charges:  OT General Charges $OT Visit: 1 Visit OT Evaluation $OT Eval Low Complexity: 1 Low OT  Treatments $Self Care/Home Management : 8-22 mins  Delbert Phenix OT OT pager: [place] 12/11/2020, 11:13 AM

## 2020-12-11 NOTE — Plan of Care (Signed)

## 2020-12-11 NOTE — Progress Notes (Signed)
Colleen Hancock [id number] September 03, 1945  CARE TEAM:  PCP: Velna Hatchet, MD  Outpatient Care Team: Patient Care Team: Velna Hatchet, MD as PCP - General (Internal Medicine) Michael Boston, MD as Consulting Physician (General Surgery) Milus Banister, MD as Attending Physician (Gastroenterology) Janyth Contes, MD as Consulting Physician (Obstetrics and Gynecology) Nicholas Lose, MD as Consulting Physician (Hematology and Oncology)  Inpatient Treatment Team: Treatment Team: Attending Provider: Michael Boston, MD; Technician: Samara Snide, NT; Registered Nurse: Quintin Alto, RN; Technician: Abbe Amsterdam, NT; Occupational Therapist: Rosemary Holms, OT/L; Physical Therapist: Donalee Citrin, PT; Charge Nurse: Aura Dials, RN; Technician: Karna Christmas, NT; Utilization Review: Tressie Stalker, RN; Utilization Review: Orlean Bradford, RN   Problem List:   Principal Problem:   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  Firm nodule at cecum near appendiceal orifice correlating with endoscopic and pathology findings.   No obvious metastatic disease on visceral parietal peritoneum or liver.   It is an isoperistaltic ileocolonic anastomosis that rests in the right lower quadrant region.   Assessment  Recovering okay so far  [facility] Stay = 2 days)  Plan:  -Enhanced recovery protocol -Improved pain control.  Patient worried about taking Tylenol but  does not have any specific concerns aside of fatty liver disease.  Tried to reassure her that we will do 3 g only.  We will add low-dose gabapentin to help.  50 mg tramadol not helping.  Increase to 100 as needed -Follow-up on pathology.  Hopefully early stage cancer. -Blood pressure control. -Probable mild chronic kidney disease.  Continue ACE inhibitor and follow. -Medlock IV fluids and follow with backup as needed fluid to avoid fluid overload. -Anxiolysis as needed -VTE prophylaxis- SCDs, etc -mobilize as tolerated to help recovery  Disposition:  Disposition:  The patient is from: Home  Anticipate discharge to:  Home with Home Health  Anticipated Date of Discharge is:  October 10,2022    Barriers to discharge:  Pending Clinical improvement (more likely than not)  Patient currently is NOT MEDICALLY STABLE for discharge from the hospital from a surgery standpoint.      30 minutes spent in review, evaluation, examination, counseling, and coordination of care.   I have reviewed this patient's available data, including medical history, events of note, physical examination and test results as part of my evaluation.  A significant portion of that time was spent in counseling.  Care during the described time interval was provided by me.  12/11/2020    Subjective: (Chief complaint)  Feeling somewhat sore.  Has not to take any medications.  Tried tramadol once and did not know if it helped.  Did get out of bed.  Tolerating some liquids.  No nausea.  Objective:  Vital signs:  Vitals:   12/10/20 1030 12/10/20 1045 12/10/20 1100 12/10/20 2118  BP: (!) 137/54 (!) 146/59 (!) 132/59 (!) 135/52  Pulse: 84 80 77   Resp: 20  15 16   Temp:   (!) 97 F (36.1 C)   TempSrc:      SpO2: 100% 100% 100%   Weight:      Height:        Last BM Date: 12/09/20  Intake/Output   Yesterday:  10/06 0701 - 10/07 0700 In: 7169 [P.O.:840; I.V.:2999; IV Piggyback:100] Out: 6789  [Urine:4130; Blood:25] This shift:  No intake/output data recorded.  Bowel function:  Flatus: YES  BM:  No  Drain: (No drain)   Physical Exam:  General: Pt awake/alert in no acute distress Eyes: PERRL, normal EOM.  Sclera clear.  No icterus Neuro: CN II-XII intact w/o focal sensory/motor deficits. Lymph: No head/neck/groin lymphadenopathy Psych:  No delerium/psychosis/paranoia.  Oriented x 4 HENT: Normocephalic, Mucus membranes moist.  No thrush Neck: Supple, No tracheal deviation.  No obvious thyromegaly Chest: No pain to chest wall compression.  Good respiratory excursion.  No audible wheezing CV:  Pulses intact.  Regular rhythm.  No major extremity edema MS: Normal AROM mjr joints.  No obvious deformity  Abdomen: Soft.  Mildy distended.   Mild periumbilical discomfort.  Dressings clean dry and intact .  No evidence of peritonitis.  No incarcerated hernias.  Ext:   No deformity.  No mjr edema.  No cyanosis Skin: No petechiae / purpurea.  No major sores.  Warm and dry    Results:   Cultures: Recent Results (from the past 720 hour(s))  SARS Coronavirus 2 (TAT 6-24 hrs)     Status: None   Collection Time: 12/08/20 12:00 AM  Result Value Ref Range Status   SARS Coronavirus 2 RESULT: NEGATIVE  Final    Comment: RESULT: NEGATIVESARS-CoV-2 INTERPRETATION:A NEGATIVE  test result means that SARS-CoV-2 RNA was not present in the specimen above the limit of detection of this test. This does not preclude a possible SARS-CoV-2 infection and should not be used as the  sole basis for patient management decisions. Negative results must be combined with clinical observations, patient history, and epidemiological information. Optimum specimen types and timing for peak viral levels during infections caused by SARS-CoV-2  have not been determined. Collection of multiple specimens or types of specimens may be necessary to detect virus. Improper specimen collection and handling, sequence  variability under primers/probes, or organism present below the limit of detection may  lead to false negative results. Positive and negative predictive values of testing are highly dependent on prevalence. False negative test results are more likely when prevalence of disease is high.The expected result is NEGATIVE.Fact S heet for  Healthcare Providers: LocalChronicle.no Sheet for Patients: SalonLookup.es Reference Range - Negative   Resp Panel by RT-PCR (Flu A&B, Covid) Nasopharyngeal Swab     Status: None   Collection Time: 12/09/20  7:59 PM   Specimen: Nasopharyngeal Swab; Nasopharyngeal(NP) swabs in vial transport medium  Result Value Ref Range Status   SARS Coronavirus 2 by RT PCR NEGATIVE NEGATIVE Final    Comment: (NOTE) SARS-CoV-2 target nucleic acids are NOT DETECTED.  The SARS-CoV-2 RNA is generally detectable in upper respiratory specimens during the acute phase of infection. The lowest concentration of SARS-CoV-2 viral copies this assay can detect is 138 copies/mL. A negative result does not preclude SARS-Cov-2 infection and should not be used as the sole basis for treatment or other patient management decisions. A negative result may occur with  improper specimen collection/handling, submission of specimen other than nasopharyngeal swab, presence of viral mutation(s) within the areas targeted by this assay, and inadequate  number of viral copies(<138 copies/mL). A negative result must be combined with clinical observations, patient history, and epidemiological information. The expected result is Negative.  Fact Sheet for Patients:  EntrepreneurPulse.com.au  Fact Sheet for Healthcare Providers:  IncredibleEmployment.be  This test is no t yet approved or cleared by the [place] FDA and  has been authorized for detection and/or diagnosis of SARS-CoV-2 by FDA under an  Emergency Use Authorization (EUA). This EUA will remain  in effect (meaning this test can be used) for the duration of the COVID-19 declaration under Section 564(b)(1) of the Act, 21 U.S.C.section 360bbb-3(b)(1), unless the authorization is terminated  or revoked sooner.       Influenza A by PCR NEGATIVE NEGATIVE Final   Influenza B by PCR NEGATIVE NEGATIVE Final    Comment: (NOTE) The Xpert Xpress SARS-CoV-2/FLU/RSV plus assay is intended as an aid in the diagnosis of influenza from Nasopharyngeal swab specimens and should not be used as a sole basis for treatment. Nasal washings and aspirates are unacceptable for Xpert Xpress SARS-CoV-2/FLU/RSV testing.  Fact Sheet for Patients: EntrepreneurPulse.com.au  Fact Sheet for Healthcare Providers: IncredibleEmployment.be  This test is not yet approved or cleared by the [place] FDA and has been authorized for detection and/or diagnosis of SARS-CoV-2 by FDA under an Emergency Use Authorization (EUA). This EUA will remain in effect (meaning this test can be used) for the duration of the COVID-19 declaration under Section 564(b)(1) of the Act, 21 U.S.C. section 360bbb-3(b)(1), unless the authorization is terminated or revoked.  Performed at [facility], [place] [address], [place], [place] [address]   Surgical PCR screen     Status: None   Collection Time: 12/10/20  3:42 AM   Specimen: Nasal Mucosa; Nasal Swab  Result Value Ref Range Status   MRSA, PCR NEGATIVE NEGATIVE Final   Staphylococcus aureus NEGATIVE NEGATIVE Final    Comment: (NOTE) The Xpert SA Assay (FDA approved for NASAL specimens in patients 26 years of age and older), is one component of a comprehensive surveillance program. It is not intended to diagnose infection nor to guide or monitor treatment. Performed at [facility], [place] [address], [place], [place] [address]     Labs: Results  for orders placed or performed during the hospital encounter of 12/09/20 (from the past 48 hour(s))  Comprehensive metabolic panel     Status: Abnormal   Collection Time: 12/09/20  6:15 PM  Result Value Ref Range   Sodium 133 (L) 135 - 145 mmol/L   Potassium 3.7 3.5 - 5.1 mmol/L   Chloride 103 98 - 111 mmol/L   CO2 19 (L) 22 - 32 mmol/L   Glucose, Bld 134 (H) 70 - 99 mg/dL    Comment: Glucose reference range applies only to samples taken after fasting for at least 8 hours.   BUN 17 8 - 23 mg/dL   Creatinine, Ser 1.28 (H) 0.44 - 1.00 mg/dL   Calcium 9.7 8.9 - 10.3 mg/dL   Total Protein 7.5 6.5 - 8.1 g/dL   Albumin 4.7 3.5 - 5.0 g/dL   AST 40 15 - 41 U/L   ALT 35 0 - 44 U/L   Alkaline Phosphatase 65 38 - 126 U/L   Total Bilirubin 1.2 0.3 - 1.2 mg/dL   GFR, Estimated 44 (L) >60 mL/min    Comment: (NOTE) Calculated using the CKD-EPI Creatinine Equation (2021)    Anion gap 11 5 - 15    Comment: Performed at [facility]  [place] [address], [place], [place] [address]  CBC with Differential     Status: None   Collection Time: 12/09/20  6:15 PM  Result Value Ref Range   WBC 9.1 4.0 - 10.5 K/uL   RBC 4.08 3.87 - 5.11 MIL/uL   Hemoglobin 13.4 12.0 - 15.0 g/dL   HCT 40.2 36.0 - 46.0 %   MCV 98.5 80.0 - 100.0 fL   MCH 32.8 26.0 - 34.0 pg   MCHC 33.3 30.0 - 36.0 g/dL   RDW 12.7 11.5 - 15.5 %   Platelets 168 150 - 400 K/uL   nRBC 0.0 0.0 - 0.2 %   Neutrophils Relative % 79 %   Neutro Abs 7.1 1.7 - 7.7 K/uL   Lymphocytes Relative 14 %   Lymphs Abs 1.3 0.7 - 4.0 K/uL   Monocytes Relative 7 %   Monocytes Absolute 0.7 0.1 - 1.0 K/uL   Eosinophils Relative 0 %   Eosinophils Absolute 0.0 0.0 - 0.5 K/uL   Basophils Relative 0 %   Basophils Absolute 0.0 0.0 - 0.1 K/uL   Immature Granulocytes 0 %   Abs Immature Granulocytes 0.03 0.00 - 0.07 K/uL    Comment: Performed at [facility], [place] [address], [place], [place] [address]  ABO/Rh     Status: None    Collection Time: 12/09/20  6:15 PM  Result Value Ref Range   ABO/RH(D)      A POS Performed at [facility], [place] [address] [place], [place] [address]   Resp Panel by RT-PCR (Flu A&B, Covid) Nasopharyngeal Swab     Status: None   Collection Time: 12/09/20  7:59 PM   Specimen: Nasopharyngeal Swab; Nasopharyngeal(NP) swabs in vial transport medium  Result Value Ref Range   SARS Coronavirus 2 by RT PCR NEGATIVE NEGATIVE    Comment: (NOTE) SARS-CoV-2 target nucleic acids are NOT DETECTED.  The SARS-CoV-2 RNA is generally detectable in upper respiratory specimens during the acute phase of infection. The lowest concentration of SARS-CoV-2 viral copies this assay can detect is 138 copies/mL. A negative result does not preclude SARS-Cov-2 infection and should not be used as the sole basis for treatment or other patient management decisions. A negative result may occur with  improper specimen collection/handling, submission of specimen other than nasopharyngeal swab, presence of viral mutation(s) within the areas targeted by this assay, and inadequate number of viral copies(<138 copies/mL). A negative result must be combined with clinical observations, patient history, and epidemiological information. The expected result is Negative.  Fact Sheet for Patients:  EntrepreneurPulse.com.au  Fact Sheet for Healthcare Providers:  IncredibleEmployment.be  This test is no t yet approved or cleared by the [place] FDA and  has been authorized for detection and/or diagnosis of SARS-CoV-2 by FDA under an Emergency Use Authorization (EUA). This EUA will remain  in effect (meaning this test can be used) for the duration of the COVID-19 declaration under Section 564(b)(1) of the Act, 21 U.S.C.section 360bbb-3(b)(1), unless the authorization is terminated  or revoked sooner.       Influenza A by PCR NEGATIVE NEGATIVE   Influenza B by PCR  NEGATIVE NEGATIVE    Comment: (NOTE) The Xpert Xpress SARS-CoV-2/FLU/RSV plus assay is intended as an aid in the diagnosis of influenza from Nasopharyngeal swab specimens and should not be used as a sole basis for treatment. Nasal washings and aspirates are unacceptable for Xpert Xpress SARS-CoV-2/FLU/RSV testing.  Fact Sheet for Patients: EntrepreneurPulse.com.au  Fact Sheet for Healthcare Providers: IncredibleEmployment.be  This test is not yet approved or cleared by the [place] FDA and has been authorized for detection and/or diagnosis of SARS-CoV-2 by FDA under an Emergency Use Authorization (EUA). This EUA will remain in effect (meaning this test can be used) for the duration of the COVID-19 declaration under Section 564(b)(1) of the Act, 21 U.S.C. section 360bbb-3(b)(1), unless the authorization is terminated or revoked.  Performed at [facility], [place] [address], [place], [place] [address]   Creatinine, serum     Status: Abnormal   Collection Time: 12/09/20 11:59 PM  Result Value Ref Range   Creatinine, Ser 1.22 (H) 0.44 - 1.00 mg/dL   GFR, Estimated 46 (L) >60 mL/min    Comment: (NOTE) Calculated using the CKD-EPI Creatinine Equation (2021) Performed at [facility], [place] [address], [place], [place] [address]   Surgical PCR screen     Status: None   Collection Time: 12/10/20  3:42 AM   Specimen: Nasal Mucosa; Nasal Swab  Result Value Ref Range   MRSA, PCR NEGATIVE NEGATIVE   Staphylococcus aureus NEGATIVE NEGATIVE    Comment: (NOTE) The Xpert SA Assay (FDA approved for NASAL specimens in patients 52 years of age and older), is one component of a comprehensive surveillance program. It is not intended to diagnose infection nor to guide or monitor treatment. Performed at [facility], [place] [address], [place], [place] [address]   Type and screen Circle     Status: None   Collection Time: 12/10/20  5:41 AM  Result Value Ref Range   ABO/RH(D) A POS    Antibody Screen NEG    Sample Expiration      12/13/2020,2359 Performed at [facility], [place] [address] [place], [place] [address]   Basic metabolic panel     Status: Abnormal   Collection Time: 12/11/20  4:38 AM  Result Value Ref Range   Sodium 137 135 - 145 mmol/L   Potassium 4.7 3.5 - 5.1 mmol/L    Comment: DELTA CHECK NOTED   Chloride 106 98 - 111 mmol/L   CO2 24 22 - 32 mmol/L   Glucose, Bld 139 (H) 70 - 99 mg/dL    Comment: Glucose reference range applies only to samples taken after fasting for at least 8 hours.   BUN 12 8 - 23 mg/dL   Creatinine, Ser 0.92 0.44 - 1.00 mg/dL   Calcium 8.9 8.9 - 10.3 mg/dL   GFR, Estimated >60 >60 mL/min    Comment: (NOTE) Calculated using the CKD-EPI Creatinine Equation (2021)    Anion gap 7 5 - 15    Comment: Performed at [facility], [place] [address] [place], [place] [address]  CBC     Status: Abnormal   Collection Time: 12/11/20  4:38 AM  Result Value Ref Range   WBC 9.8 4.0 - 10.5 K/uL   RBC 3.58 (L) 3.87 - 5.11 MIL/uL   Hemoglobin 11.9 (L) 12.0 - 15.0 g/dL   HCT 34.7 (L) 36.0 - 46.0 %   MCV 96.9 80.0 - 100.0 fL   MCH 33.2 26.0 - 34.0 pg   MCHC 34.3 30.0 - 36.0 g/dL   RDW 13.0 11.5 - 15.5 %   Platelets 131 (L) 150 - 400 K/uL    Comment: SPECIMEN CHECKED FOR CLOTS REPEATED TO VERIFY    nRBC 0.0 0.0 - 0.2 %    Comment: Performed  at [facility], [place] [address], [place], [place] [address]  Magnesium     Status: None   Collection Time: 12/11/20  4:38 AM  Result Value Ref Range   Magnesium 2.2 1.7 - 2.4 mg/dL    Comment: Performed at [facility], [place] [address], [place], [place] [address]  Phosphorus     Status: None   Collection Time: 12/11/20  4:38 AM  Result Value Ref Range   Phosphorus 3.1 2.5 - 4.6 mg/dL    Comment: Performed at [facility], [place] [address] [place], [place] [address]    Imaging / Studies: CT Head Wo Contrast  Result Date: 12/09/2020 CLINICAL DATA:  Syncopal episode. EXAM: CT HEAD WITHOUT CONTRAST CT CERVICAL SPINE WITHOUT CONTRAST TECHNIQUE: Multidetector CT imaging of the head and cervical spine was performed following the standard protocol without intravenous contrast. Multiplanar CT image reconstructions of the cervical spine were also generated. COMPARISON:  CT chest 11/05/2020 FINDINGS: CT HEAD FINDINGS BRAIN: BRAIN Cerebral ventricle sizes are concordant with the degree of cerebral volume loss. Patchy and confluent areas of decreased attenuation are noted throughout the deep and periventricular white matter of the cerebral hemispheres bilaterally, compatible with chronic microvascular ischemic disease. No evidence of large-territorial acute infarction. No parenchymal hemorrhage. No mass lesion. No extra-axial collection. No mass effect or midline shift. No hydrocephalus. Basilar cisterns are patent. Vascular: No hyperdense vessel. Skull: No acute fracture or focal lesion. Sinuses/Orbits: Paranasal sinuses and mastoid air cells are clear. The orbits are unremarkable. Other: None. CT CERVICAL SPINE FINDINGS Alignment: Straightening of normal cervical lordosis likely due to positioning and degenerative changes. Grade 1 anterolisthesis of due foreign C5. Skull base and vertebrae: Multilevel at least moderate degenerative changes of the spine most prominent at the C6-C7 levels. No acute fracture. No aggressive appearing focal osseous lesion or focal pathologic process. Soft tissues and spinal canal: No prevertebral fluid or swelling. No visible canal hematoma. Upper chest: Similar-appearing right apical scarring. Other: Partially retropharyngeal right common carotid artery. IMPRESSION: 1. No acute intracranial abnormality. 2. No acute displaced fracture or traumatic listhesis of the cervical spine.  Electronically Signed   By: Iven Finn M.D.   On: 12/09/2020 19:54   CT Cervical Spine Wo Contrast  Result Date: 12/09/2020 CLINICAL DATA:  Syncopal episode. EXAM: CT HEAD WITHOUT CONTRAST CT CERVICAL SPINE WITHOUT CONTRAST TECHNIQUE: Multidetector CT imaging of the head and cervical spine was performed following the standard protocol without intravenous contrast. Multiplanar CT image reconstructions of the cervical spine were also generated. COMPARISON:  CT chest 11/05/2020 FINDINGS: CT HEAD FINDINGS BRAIN: BRAIN Cerebral ventricle sizes are concordant with the degree of cerebral volume loss. Patchy and confluent areas of decreased attenuation are noted throughout the deep and periventricular white matter of the cerebral hemispheres bilaterally, compatible with chronic microvascular ischemic disease. No evidence of large-territorial acute infarction. No parenchymal hemorrhage. No mass lesion. No extra-axial collection. No mass effect or midline shift. No hydrocephalus. Basilar cisterns are patent. Vascular: No hyperdense vessel. Skull: No acute fracture or focal lesion. Sinuses/Orbits: Paranasal sinuses and mastoid air cells are clear. The orbits are unremarkable. Other: None. CT CERVICAL SPINE FINDINGS Alignment: Straightening of normal cervical lordosis likely due to positioning and degenerative changes. Grade 1 anterolisthesis of due foreign C5. Skull base and vertebrae: Multilevel at least moderate degenerative changes of the spine most prominent at the C6-C7 levels. No acute fracture. No aggressive appearing focal osseous lesion or focal pathologic process. Soft tissues  and spinal canal: No prevertebral fluid or swelling. No visible canal hematoma. Upper chest: Similar-appearing right apical scarring. Other: Partially retropharyngeal right common carotid artery. IMPRESSION: 1. No acute intracranial abnormality. 2. No acute displaced fracture or traumatic listhesis of the cervical spine. Electronically  Signed   By: Iven Finn M.D.   On: 12/09/2020 19:54   DG Chest Port 1 View  Result Date: 12/09/2020 CLINICAL DATA:  Syncopal episode, history of colon cancer scheduled for partial colectomy tomorrow, started bowel prep at this morning and was having persistent uncontrolled diarrhea. Past history hypertension, breast cancer EXAM: PORTABLE CHEST 1 VIEW COMPARISON:  Portable exam 2024 hours compared to 04/11/2014 FINDINGS: Normal heart size, mediastinal contours, and pulmonary vascularity. Atherosclerotic calcification aorta. Kyphotic positioning. Bibasilar atelectasis. Remaining lungs clear. No pleural effusion or pneumothorax. Bones demineralized. IMPRESSION: Bibasilar atelectasis. Aortic Atherosclerosis (ICD10-I70.0). Electronically Signed   By: Lavonia Dana M.D.   On: 12/09/2020 20:36    Medications / Allergies: per chart  Antibiotics: Anti-infectives (From admission, onward)    Start     Dose/Rate Route Frequency Ordered Stop   12/10/20 2000  cefoTEtan (CEFOTAN) 2 g in sodium chloride 0.9 % 100 mL IVPB        2 g 200 mL/hr over 30 Minutes Intravenous Every 12 hours 12/10/20 1202 12/10/20 2200   12/10/20 1400  neomycin (MYCIFRADIN) tablet 1,000 mg  Status:  Discontinued       See Hyperspace for full Linked Orders Report.   1,000 mg Oral 3 times per day 12/10/20 0628 12/10/20 0638   12/10/20 1400  metroNIDAZOLE (FLAGYL) tablet 1,000 mg  Status:  Discontinued       See Hyperspace for full Linked Orders Report.   1,000 mg Oral 3 times per day 12/10/20 0628 12/10/20 0638   12/10/20 0715  cefoTEtan (CEFOTAN) 2 g in sodium chloride 0.9 % 100 mL IVPB        2 g 200 mL/hr over 30 Minutes Intravenous On call to O.R. 12/10/20 0628 12/10/20 0807   12/09/20 2200  neomycin (MYCIFRADIN) tablet 1,000 mg        1,000 mg Oral  Once 12/09/20 2047 12/10/20 0016   12/09/20 2200  metroNIDAZOLE (FLAGYL) tablet 500 mg        500 mg Oral  Once 12/09/20 2047 12/10/20 0015         Note: Portions of  this report may have been transcribed using voice recognition software. Every effort was made to ensure accuracy; however, inadvertent computerized transcription errors may be present.   Any transcriptional errors that result from this process are unintentional.    Adin Hector, MD, FACS, MASCRS Esophageal, Gastrointestinal & Colorectal Surgery Robotic and Minimally Invasive Surgery  [facility], [place]  [place] [place], [place] [address] [phone] Fax [phone] Main  CONTACT INFORMATION:  Weekday (9AM-5PM): Call [facility] main office at [phone]  Weeknight (5PM-9AM) or Weekend/Holiday: Check [email or web address] (password " [email or web address]") for General Surgery [facility] coverage  (Please, do not use SecureChat as it is not reliable communication to operating surgeons for immediate patient care)      12/11/2020  7:21 AM

## 2020-12-12 NOTE — Progress Notes (Signed)
Pharmacy Brief Note - Alvimopan (Entereg)  The standing order set for alvimopan (Entereg) now includes an automatic order to discontinue the drug after the patient has had a bowel movement. The change was approved by the [facility] and the Medical Executive Committee.   This patient has had bowel movements documented by nursing. Therefore, alvimopan has been discontinued. If there are questions, please contact the pharmacy at [phone].   Thank you- Peggyann Juba, PharmD, BCPS 12/12/2020 11:44 AM

## 2020-12-12 NOTE — Progress Notes (Signed)
Colleen Hancock [id number] 1945-09-01  CARE TEAM:  PCP: Velna Hatchet, MD  Outpatient Care Team: Patient Care Team: Velna Hatchet, MD as PCP - General (Internal Medicine) Michael Boston, MD as Consulting Physician (General Surgery) Milus Banister, MD as Attending Physician (Gastroenterology) Janyth Contes, MD as Consulting Physician (Obstetrics and Gynecology) Nicholas Lose, MD as Consulting Physician (Hematology and Oncology)  Inpatient Treatment Team: Treatment Team: Attending Provider: Michael Boston, MD; Physical Therapist: Donalee Citrin, PT; Utilization Review: Alease Medina, RN; Charge Nurse: Tanda Rockers, RN   Problem List:   Principal Problem:   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  Firm nodule at cecum near appendiceal orifice correlating with endoscopic and pathology findings.   No obvious metastatic disease on visceral parietal peritoneum or liver.   It is an isoperistaltic ileocolonic anastomosis that rests in the right lower quadrant region.   Assessment  POD 2 robotic R colon  [facility] Stay = 3 days)  Plan:  -Enhanced recovery protocol -Continue multimodal pain control -Follow-up on pathology.  Hopefully early stage cancer. -Blood pressure control. -Probable mild chronic kidney disease.  Continue ACE inhibitor and follow. -Medlock IV fluids and follow with backup as needed fluid to  avoid fluid overload. -Anxiolysis as needed -VTE prophylaxis- SCDs, etc -mobilize as tolerated to help recovery- patients #1 job today is to ambulate multiple times in halls  Disposition:  Disposition:  The patient is from: [facility] discharge to:  Home with Home Health  Anticipated Date of Discharge is:  October 10,2022    Barriers to discharge:  Pending Clinical improvement (more likely than not)  Patient currently is NOT MEDICALLY STABLE for discharge from the hospital from a surgery standpoint.      30 minutes spent in review, evaluation, examination, counseling, and coordination of care.   I have reviewed this patient's available data, including medical history, events of note, physical examination and test results as part of my evaluation.  A significant portion of that time was spent in counseling.  Care during the described time interval was provided by me.  12/12/2020    Subjective: (Chief complaint)  Feels sore. Tolerating PO without issue. Having watery bowel movements.   Objective:  Vital signs:  Vitals:   12/10/20 1100 12/10/20 2118 12/11/20 2044 12/12/20 0451  BP: (!) 132/59 (!) 135/52 (!) 154/67 (!) 118/48  Pulse: 77  72 72  Resp: 16  16 16   Temp: (!) 97 F (36.1 C)  98.4 F (36.9 C) 98.5 F (36.9 C)  TempSrc:   Oral Oral  SpO2: 100%  94% 93%  Weight:      Height:        Last BM Date: 12/11/20  Intake/Output   Yesterday:  10/07 0701 - 10/08 0700 In: 780 [P.O.:780] Out: 600 [Urine:600] This shift:  No intake/output data recorded.  Bowel function:  Flatus: YES  BM:  No  Drain: (  No drain)   Physical Exam:  General: Pt awake/alert in no acute distress Eyes: PERRL, normal EOM.  Sclera clear.  No icterus Neuro: CN II-XII intact w/o focal sensory/motor deficits. Lymph: No head/neck/groin lymphadenopathy Psych:  No delerium/psychosis/paranoia.  Oriented x 4 HENT: Normocephalic, Mucus membranes moist.  No thrush Neck: Supple, No  tracheal deviation.  No obvious thyromegaly Chest: No pain to chest wall compression.  Good respiratory excursion.  No audible wheezing CV:  Pulses intact.  Regular rhythm.  No major extremity edema MS: Normal AROM mjr joints.  No obvious deformity  Abdomen: Soft.  Mildy distended.   Mild periumbilical discomfort.  Dressings clean dry and intact .  No evidence of peritonitis.  No incarcerated hernias.  Ext:   No deformity.  No mjr edema.  No cyanosis Skin: No petechiae / purpurea.  No major sores.  Warm and dry    Results:   Cultures: Recent Results (from the past 720 hour(s))  SARS Coronavirus 2 (TAT 6-24 hrs)     Status: None   Collection Time: 12/08/20 12:00 AM  Result Value Ref Range Status   SARS Coronavirus 2 RESULT: NEGATIVE  Final    Comment: RESULT: NEGATIVESARS-CoV-2 INTERPRETATION:A NEGATIVE  test result means that SARS-CoV-2 RNA was not present in the specimen above the limit of detection of this test. This does not preclude a possible SARS-CoV-2 infection and should not be used as the  sole basis for patient management decisions. Negative results must be combined with clinical observations, patient history, and epidemiological information. Optimum specimen types and timing for peak viral levels during infections caused by SARS-CoV-2  have not been determined. Collection of multiple specimens or types of specimens may be necessary to detect virus. Improper specimen collection and handling, sequence variability under primers/probes, or organism present below the limit of detection may  lead to false negative results. Positive and negative predictive values of testing are highly dependent on prevalence. False negative test results are more likely when prevalence of disease is high.The expected result is NEGATIVE.Fact S heet for  Healthcare Providers: LocalChronicle.no Sheet for Patients: SalonLookup.es Reference Range -  Negative   Resp Panel by RT-PCR (Flu A&B, Covid) Nasopharyngeal Swab     Status: None   Collection Time: 12/09/20  7:59 PM   Specimen: Nasopharyngeal Swab; Nasopharyngeal(NP) swabs in vial transport medium  Result Value Ref Range Status   SARS Coronavirus 2 by RT PCR NEGATIVE NEGATIVE Final    Comment: (NOTE) SARS-CoV-2 target nucleic acids are NOT DETECTED.  The SARS-CoV-2 RNA is generally detectable in upper respiratory specimens during the acute phase of infection. The lowest concentration of SARS-CoV-2 viral copies this assay can detect is 138 copies/mL. A negative result does not preclude SARS-Cov-2 infection and should not be used as the sole basis for treatment or other patient management decisions. A negative result may occur with  improper specimen collection/handling, submission of specimen other than nasopharyngeal swab, presence of viral mutation(s) within the areas targeted by this assay, and inadequate number of viral copies(<138 copies/mL). A negative result must be combined with clinical observations, patient history, and epidemiological information. The expected result is Negative.  Fact Sheet for Patients:  EntrepreneurPulse.com.au  Fact Sheet for Healthcare Providers:  IncredibleEmployment.be  This test is no t yet approved or cleared by the [place] FDA and  has been authorized for detection and/or diagnosis of SARS-CoV-2 by FDA under an Emergency Use Authorization (EUA). This EUA will remain  in effect (meaning this test can  be used) for the duration of the COVID-19 declaration under Section 564(b)(1) of the Act, 21 U.S.C.section 360bbb-3(b)(1), unless the authorization is terminated  or revoked sooner.       Influenza A by PCR NEGATIVE NEGATIVE Final   Influenza B by PCR NEGATIVE NEGATIVE Final    Comment: (NOTE) The Xpert Xpress SARS-CoV-2/FLU/RSV plus assay is intended as an aid in the diagnosis of influenza  from Nasopharyngeal swab specimens and should not be used as a sole basis for treatment. Nasal washings and aspirates are unacceptable for Xpert Xpress SARS-CoV-2/FLU/RSV testing.  Fact Sheet for Patients: EntrepreneurPulse.com.au  Fact Sheet for Healthcare Providers: IncredibleEmployment.be  This test is not yet approved or cleared by the [place] FDA and has been authorized for detection and/or diagnosis of SARS-CoV-2 by FDA under an Emergency Use Authorization (EUA). This EUA will remain in effect (meaning this test can be used) for the duration of the COVID-19 declaration under Section 564(b)(1) of the Act, 21 U.S.C. section 360bbb-3(b)(1), unless the authorization is terminated or revoked.  Performed at [facility], [place] [address] [place], [place] [address]   Surgical PCR screen     Status: None   Collection Time: 12/10/20  3:42 AM   Specimen: Nasal Mucosa; Nasal Swab  Result Value Ref Range Status   MRSA, PCR NEGATIVE NEGATIVE Final   Staphylococcus aureus NEGATIVE NEGATIVE Final    Comment: (NOTE) The Xpert SA Assay (FDA approved for NASAL specimens in patients 68 years of age and older), is one component of a comprehensive surveillance program. It is not intended to diagnose infection nor to guide or monitor treatment. Performed at [facility], [place] [address] [place], [place] [address]     Labs: Results for orders placed or performed during the hospital encounter of 12/09/20 (from the past 48 hour(s))  Basic metabolic panel     Status: Abnormal   Collection Time: 12/11/20  4:38 AM  Result Value Ref Range   Sodium 137 135 - 145 mmol/L   Potassium 4.7 3.5 - 5.1 mmol/L    Comment: DELTA CHECK NOTED   Chloride 106 98 - 111 mmol/L   CO2 24 22 - 32 mmol/L   Glucose, Bld 139 (H) 70 - 99 mg/dL    Comment: Glucose reference range applies only to samples taken after fasting for at least 8  hours.   BUN 12 8 - 23 mg/dL   Creatinine, Ser 0.92 0.44 - 1.00 mg/dL   Calcium 8.9 8.9 - 10.3 mg/dL   GFR, Estimated >60 >60 mL/min    Comment: (NOTE) Calculated using the CKD-EPI Creatinine Equation (2021)    Anion gap 7 5 - 15    Comment: Performed at [facility], [place] [address] [place],  [address]  CBC     Status: Abnormal   Collection Time: 12/11/20  4:38 AM  Result Value Ref Range   WBC 9.8 4.0 - 10.5 K/uL   RBC 3.58 (L) 3.87 - 5.11 MIL/uL   Hemoglobin 11.9 (L) 12.0 - 15.0 g/dL   HCT 34.7 (L) 36.0 - 46.0 %   MCV 96.9 80.0 - 100.0 fL   MCH 33.2 26.0 - 34.0 pg   MCHC 34.3 30.0 - 36.0 g/dL   RDW 13.0 11.5 - 15.5 %   Platelets 131 (L) 150 - 400 K/uL    Comment: SPECIMEN CHECKED FOR CLOTS REPEATED TO VERIFY    nRBC 0.0 0.0 - 0.2 %    Comment: Performed  at [facility], [place] [address], [place], [place] [address]  Magnesium     Status: None   Collection Time: 12/11/20  4:38 AM  Result Value Ref Range   Magnesium 2.2 1.7 - 2.4 mg/dL    Comment: Performed at [facility], [place] [address], [place], [place] [address]  Phosphorus     Status: None   Collection Time: 12/11/20  4:38 AM  Result Value Ref Range   Phosphorus 3.1 2.5 - 4.6 mg/dL    Comment: Performed at [facility], [place] [address] [place], [place] [address]    Imaging / Studies: No results found.  Medications / Allergies: per chart  Antibiotics: Anti-infectives (From admission, onward)    Start     Dose/Rate Route Frequency Ordered Stop   12/10/20 2000  cefoTEtan (CEFOTAN) 2 g in sodium chloride 0.9 % 100 mL IVPB        2 g 200 mL/hr over 30 Minutes Intravenous Every 12 hours 12/10/20 1202 12/10/20 2200   12/10/20 1400  neomycin (MYCIFRADIN) tablet 1,000 mg  Status:  Discontinued       See Hyperspace for full Linked Orders Report.   1,000 mg Oral 3 times per day 12/10/20 0628 12/10/20 0638   12/10/20 1400  metroNIDAZOLE  (FLAGYL) tablet 1,000 mg  Status:  Discontinued       See Hyperspace for full Linked Orders Report.   1,000 mg Oral 3 times per day 12/10/20 0628 12/10/20 0638   12/10/20 0715  cefoTEtan (CEFOTAN) 2 g in sodium chloride 0.9 % 100 mL IVPB        2 g 200 mL/hr over 30 Minutes Intravenous On call to O.R. 12/10/20 0628 12/10/20 0807   12/09/20 2200  neomycin (MYCIFRADIN) tablet 1,000 mg        1,000 mg Oral  Once 12/09/20 2047 12/10/20 0016   12/09/20 2200  metroNIDAZOLE (FLAGYL) tablet 500 mg        500 mg Oral  Once 12/09/20 2047 12/10/20 0015         Note: Portions of this report may have been transcribed using voice recognition software. Every effort was made to ensure accuracy; however, inadvertent computerized transcription errors may be present.   Any transcriptional errors that result from this process are unintentional.    [facility], [place] [address] [place], [place] [address] [phone] Fax [phone] Main  CONTACT INFORMATION:  Weekday (9AM-5PM): Call [facility] main office at [phone]  Weeknight (5PM-9AM) or Weekend/Holiday: Check [email or web address] (password " [email or web address]") for General Surgery [facility] coverage  (Please, do not use SecureChat as it is not reliable communication to operating surgeons for immediate patient care)      12/12/2020  8:35 AM

## 2020-12-12 NOTE — Progress Notes (Signed)
Physical Therapy Treatment Patient Details Name: Colleen Hancock MRN: [id number] DOB: Jul 18, 1945 Today's Date: 12/12/2020   History of Present Illness Patient is a 75 year old female with cecal cancer s/p robotic proximal colectomy. PMH includes breast CA, arthritis, HTN    PT Comments    Pt limited by abdominal soreness/pain. Pt ambulates 32 ft, still needing RW, taking short, steady, very slow steps with RW without LOB. Pt taking standing rest breaks, on her feet for ~10 minutes for limited distance, encouraged breathing and upright posture as tolerable. Pt tolerates remaining up in recliner at EOS; motivated to ambulate later with nursing- RN notified.   Recommendations for follow up therapy are one component of a multi-disciplinary discharge planning process, led by the attending physician.  Recommendations may be updated based on patient status, additional functional criteria and insurance authorization.  Follow Up Recommendations  Home health PT     Equipment Recommendations  None recommended by PT    Recommendations for Other Services       Precautions / Restrictions Precautions Precautions: Fall Precaution Comments: abd sx Restrictions Weight Bearing Restrictions: No     Mobility  Bed Mobility Overal bed mobility: Needs Assistance Bed Mobility: Rolling;Sidelying to Sit Rolling: Supervision Sidelying to sit: Supervision;HOB elevated  General bed mobility comments: supv with HOB elevated, increased time and use of bedrail due to pain, no physical assist    Transfers Overall transfer level: Needs assistance Equipment used: Rolling walker (2 wheeled) Transfers: Sit to/from Stand Sit to Stand: Min guard  General transfer comment: VCs for hand placement to power up, slow to rise, VCs for trunk extension once upright due to flexed trunk posture  Ambulation/Gait Ambulation/Gait assistance: Min guard Gait Distance (Feet): 32 Feet Assistive device: Rolling  walker (2 wheeled) Gait Pattern/deviations: Step-through pattern;Decreased stride length;Trunk flexed Gait velocity: decreased   General Gait Details: steady, short, very slow steps with trunk flexed, no LOB but limited by abdominal soreness/pain   Stairs             Wheelchair Mobility    Modified Rankin (Stroke Patients Only)       Balance Overall balance assessment: Needs assistance Sitting-balance support: Feet supported Sitting balance-Leahy Scale: Good Sitting balance - Comments: seated EOB   Standing balance support: No upper extremity supported Standing balance-Leahy Scale: Fair Standing balance comment: static without UE support, dynamic with RW    Cognition Arousal/Alertness: Awake/alert Behavior During Therapy: WFL for tasks assessed/performed Overall Cognitive Status: Within Functional Limits for tasks assessed     Exercises      General Comments        Pertinent Vitals/Pain Pain Assessment: 0-10 Pain Score: 9  Pain Location: lower abdomen Pain Descriptors / Indicators: Sore;Aching Pain Intervention(s): Limited activity within patient's tolerance;Monitored during session;Premedicated before session;Repositioned    Home Living                      Prior Function            PT Goals (current goals can now be found in the care plan section) Acute Rehab PT Goals Patient Stated Goal: get stronger PT Goal Formulation: With patient Time For Goal Achievement: 12/25/20 Potential to Achieve Goals: Good Progress towards PT goals: Progressing toward goals    Frequency    Min 3X/week      PT Plan Current plan remains appropriate    Co-evaluation              AM-PAC  PT "6 Clicks" Mobility   Outcome Measure  Help needed turning from your back to your side while in a flat bed without using bedrails?: A Little Help needed moving from lying on your back to sitting on the side of a flat bed without using bedrails?: A Little Help  needed moving to and from a bed to a chair (including a wheelchair)?: A Little Help needed standing up from a chair using your arms (e.g., wheelchair or bedside chair)?: A Little Help needed to walk in hospital room?: A Little Help needed climbing 3-5 steps with a railing? : A Little 6 Click Score: 18    End of Session Equipment Utilized During Treatment: Gait belt Activity Tolerance: Patient limited by pain Patient left: in chair;with call bell/phone within reach Nurse Communication: Mobility status PT Visit Diagnosis: Other abnormalities of gait and mobility (R26.89);Pain Pain - part of body:  (abdomen)     Time: 7972-8206 PT Time Calculation (min) (ACUTE ONLY): 20 min  Charges:  $Gait Training: 8-22 mins                      Tori Wendee Hata PT, DPT 12/12/20, 9:33 AM

## 2020-12-12 NOTE — Plan of Care (Signed)

## 2020-12-13 ENCOUNTER — Inpatient Hospital Stay (HOSPITAL_COMMUNITY): Payer: Medicare Other

## 2020-12-13 NOTE — Progress Notes (Signed)
Pt slept after previous episode, when she awoke, pt again states that she has visual disturbances (blurred vision/double vision). Also states the top of her head is tingly, like "a spider is walking on her head," that she just feels "off". VSS, PERRL, grips = , MOE X4 , smile symetrical, tongue midline, does have + sensation to 4 areas of face. MD notified, orders received. Will cont to monitor

## 2020-12-13 NOTE — Progress Notes (Addendum)
Giving pt her 4 pm meds and she complained of a brief episode of double vision (lasted approx 10 minutes) and now says that she feels "lightheaded." VSS, altho noted BP has been running soft = 108/57, P 69, 98% O2 sat on RA, T 98.4. Neuro cks (other than brief episode of above) all normal. PERRL, Grips equal, MOE X 4, Smile symetrical, tongue is midline, no pain anywhere. Dr Kae Heller notified of above, will cont to monitor.

## 2020-12-13 NOTE — Progress Notes (Signed)
Colleen Hancock [id number] Feb 28, 1946  CARE TEAM:  PCP: Velna Hatchet, MD  Outpatient Care Team: Patient Care Team: Velna Hatchet, MD as PCP - General (Internal Medicine) Michael Boston, MD as Consulting Physician (General Surgery) Milus Banister, MD as Attending Physician (Gastroenterology) Janyth Contes, MD as Consulting Physician (Obstetrics and Gynecology) Nicholas Lose, MD as Consulting Physician (Hematology and Oncology)  Inpatient Treatment Team: Treatment Team: Attending Provider: Michael Boston, MD; Utilization Review: Alease Medina, RN; Registered Nurse: Dorinda Hill, RN; Charge Nurse: Charlyne Petrin, RN; Social Worker: Illene Regulus, [place]   Problem List:   Principal Problem:   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  Firm nodule at cecum near appendiceal orifice correlating with endoscopic and pathology findings.   No obvious metastatic disease on visceral parietal peritoneum or liver.   It is an isoperistaltic ileocolonic anastomosis that rests in the right lower quadrant region.   Assessment  POD 3 robotic R colon  [facility] Stay = 4 days)  Plan:  -Enhanced recovery protocol -Continue multimodal pain control -Follow-up on pathology.  Hopefully early stage cancer. -Blood pressure control. -Probable mild chronic kidney disease.  Continue ACE inhibitor and follow. -Medlock IV fluids and follow  with backup as needed fluid to avoid fluid overload. -Anxiolysis as needed -VTE prophylaxis- SCDs, etc -mobilize as tolerated to help recovery- patients #1 job today is to ambulate multiple times in halls  No major changes today - continued recovery per protocol  Disposition:  Disposition:  The patient is from: [place] discharge to:  Home with Home Health  Anticipated Date of Discharge is:  October 10,2022    Barriers to discharge:  Pending Clinical improvement (more likely than not)  Patient currently is NOT MEDICALLY STABLE for discharge from the hospital from a surgery standpoint.      30 minutes spent in review, evaluation, examination, counseling, and coordination of care.   I have reviewed this patient's available data, including medical history, events of note, physical examination and test results as part of my evaluation.  A significant portion of that time was spent in counseling.  Care during the described time interval was provided by me.  12/13/2020    Subjective: (Chief complaint)  Doing okay, diarrhea is wearing her down, pain around incisions, shy about asking for pain medication.  Objective:  Vital signs:  Vitals:   12/12/20 0451 12/12/20 1340 12/12/20 2100 12/13/20 0553  BP: (!) 118/48 111/64 (!) 145/46 (!) 118/53  Pulse: 72 82 78 81  Resp: 16 20 18 16   Temp: 98.5 F (36.9 C) 98.2 F (36.8 C) 97.9 F (36.6 C) 98.2 F (36.8 C)  TempSrc: Oral Oral Oral Oral  SpO2: 93% 96% 95% 97%  Weight:      Height:        Last BM Date: 12/13/20  Intake/Output   Yesterday:  10/08 0701 - 10/09 0700 In: 240 [P.O.:240] Out: -  This shift:  Total I/O In: 240 [P.O.:240] Out: -   Bowel function:  Flatus: YES  BM:  No  Drain: (No drain)   Physical Exam:  General: Pt awake/alert in no acute distress Eyes: PERRL, normal EOM.  Sclera clear.  No icterus Neuro: CN II-XII intact w/o focal sensory/motor deficits. Lymph: No head/neck/groin  lymphadenopathy Psych:  No delerium/psychosis/paranoia.  Oriented x 4 HENT: Normocephalic, Mucus membranes moist.  No thrush Neck: Supple, No tracheal deviation.  No obvious thyromegaly Chest: No pain to chest wall compression.  Good respiratory excursion.  No audible wheezing CV:  Pulses intact.  Regular rhythm.  No major extremity edema MS: Normal AROM mjr joints.  No obvious deformity  Abdomen: Soft.  Mildy distended.   Mild periumbilical discomfort.  Dressings clean dry and intact .  No evidence of peritonitis.  No incarcerated hernias.  Ext:   No deformity.  No mjr edema.  No cyanosis Skin: No petechiae / purpurea.  No major sores.  Warm and dry    Results:   Cultures: Recent Results (from the past 720 hour(s))  SARS Coronavirus 2 (TAT 6-24 hrs)     Status: None   Collection Time: 12/08/20 12:00 AM  Result Value Ref Range Status   SARS Coronavirus 2 RESULT: NEGATIVE  Final    Comment: RESULT: NEGATIVESARS-CoV-2 INTERPRETATION:A NEGATIVE  test result means that SARS-CoV-2 RNA was not present in the specimen above the limit of detection of this test. This does not preclude a possible SARS-CoV-2 infection and should not be used as the  sole basis for patient management decisions. Negative results must be combined with clinical observations, patient history, and epidemiological information. Optimum specimen types and timing for peak viral levels during infections caused by SARS-CoV-2  have not been determined. Collection of multiple specimens or types of specimens may be necessary to detect virus. Improper specimen collection and handling, sequence variability under primers/probes, or organism present below the limit of detection may  lead to false negative results. Positive and negative predictive values of testing are highly dependent on prevalence. False negative test results are more likely when prevalence of disease is high.The expected result is NEGATIVE.Fact S heet for  Healthcare  Providers: LocalChronicle.no Sheet for Patients: SalonLookup.es Reference Range - Negative   Resp Panel by RT-PCR (Flu A&B, Covid) Nasopharyngeal Swab     Status: None   Collection Time: 12/09/20  7:59 PM   Specimen: Nasopharyngeal Swab; Nasopharyngeal(NP) swabs in vial transport medium  Result Value Ref Range Status   SARS Coronavirus 2 by RT PCR NEGATIVE NEGATIVE Final    Comment: (NOTE) SARS-CoV-2 target nucleic acids are NOT DETECTED.  The SARS-CoV-2 RNA is generally detectable in upper respiratory specimens during the acute phase of infection. The lowest concentration of SARS-CoV-2 viral copies this assay can detect is 138 copies/mL. A negative result does not preclude SARS-Cov-2 infection and should not be used as the sole basis for treatment or other patient management decisions. A negative result may occur with  improper specimen collection/handling, submission of specimen other than nasopharyngeal swab, presence of viral mutation(s) within the areas targeted by this assay, and inadequate number of viral copies(<138 copies/mL). A negative result must be combined with clinical observations, patient history, and epidemiological information. The expected result is Negative.  Fact Sheet for Patients:  EntrepreneurPulse.com.au  Fact Sheet for Healthcare Providers:  IncredibleEmployment.be  This test is no t yet approved or cleared by the [place] FDA and  has been authorized for detection  and/or diagnosis of SARS-CoV-2 by FDA under an Emergency Use Authorization (EUA). This EUA will remain  in effect (meaning this test can be used) for the duration of the COVID-19 declaration under Section 564(b)(1) of the Act, 21 U.S.C.section 360bbb-3(b)(1), unless the authorization is terminated  or revoked sooner.       Influenza A by PCR NEGATIVE NEGATIVE Final   Influenza B by PCR  NEGATIVE NEGATIVE Final    Comment: (NOTE) The Xpert Xpress SARS-CoV-2/FLU/RSV plus assay is intended as an aid in the diagnosis of influenza from Nasopharyngeal swab specimens and should not be used as a sole basis for treatment. Nasal washings and aspirates are unacceptable for Xpert Xpress SARS-CoV-2/FLU/RSV testing.  Fact Sheet for Patients: EntrepreneurPulse.com.au  Fact Sheet for Healthcare Providers: IncredibleEmployment.be  This test is not yet approved or cleared by the [place] FDA and has been authorized for detection and/or diagnosis of SARS-CoV-2 by FDA under an Emergency Use Authorization (EUA). This EUA will remain in effect (meaning this test can be used) for the duration of the COVID-19 declaration under Section 564(b)(1) of the Act, 21 U.S.C. section 360bbb-3(b)(1), unless the authorization is terminated or revoked.  Performed at [facility], [place] [address], [place], [place] [address]   Surgical PCR screen     Status: None   Collection Time: 12/10/20  3:42 AM   Specimen: Nasal Mucosa; Nasal Swab  Result Value Ref Range Status   MRSA, PCR NEGATIVE NEGATIVE Final   Staphylococcus aureus NEGATIVE NEGATIVE Final    Comment: (NOTE) The Xpert SA Assay (FDA approved for NASAL specimens in patients 82 years of age and older), is one component of a comprehensive surveillance program. It is not intended to diagnose infection nor to guide or monitor treatment. Performed at [facility], [place] [address] [place], [place] [address]     Labs: No results found for this or any previous visit (from the past 48 hour(s)).   Imaging / Studies: No results found.  Medications / Allergies: per chart  Antibiotics: Anti-infectives (From admission, onward)    Start     Dose/Rate Route Frequency Ordered Stop   12/10/20 2000  cefoTEtan (CEFOTAN) 2 g in sodium chloride 0.9 % 100 mL IVPB        2  g 200 mL/hr over 30 Minutes Intravenous Every 12 hours 12/10/20 1202 12/10/20 2200   12/10/20 1400  neomycin (MYCIFRADIN) tablet 1,000 mg  Status:  Discontinued       See Hyperspace for full Linked Orders Report.   1,000 mg Oral 3 times per day 12/10/20 0628 12/10/20 0638   12/10/20 1400  metroNIDAZOLE (FLAGYL) tablet 1,000 mg  Status:  Discontinued       See Hyperspace for full Linked Orders Report.   1,000 mg Oral 3 times per day 12/10/20 0628 12/10/20 0638   12/10/20 0715  cefoTEtan (CEFOTAN) 2 g in sodium chloride 0.9 % 100 mL IVPB        2 g 200 mL/hr over 30 Minutes Intravenous On call to O.R. 12/10/20 0628 12/10/20 0807   12/09/20 2200  neomycin (MYCIFRADIN) tablet 1,000 mg        1,000 mg Oral  Once 12/09/20 2047 12/10/20 0016   12/09/20 2200  metroNIDAZOLE (FLAGYL) tablet 500 mg        500 mg Oral  Once 12/09/20 2047 12/10/20 0015         Note: Portions of this report may have been transcribed using voice  recognition software. Every effort was made to ensure accuracy; however, inadvertent computerized transcription errors may be present.   Any transcriptional errors that result from this process are unintentional.     [facility], [place] [address], [place], [place] [address] [phone] Fax [phone] Main  CONTACT INFORMATION:  Weekday (9AM-5PM): Call [facility] main office at [phone]  Weeknight (5PM-9AM) or Weekend/Holiday: Check [email or web address] (password " [email or web address]") for General Surgery [facility] coverage  (Please, do not use SecureChat as it is not reliable communication to operating surgeons for immediate patient care)      12/13/2020  10:30 AM

## 2020-12-14 LAB — CBC
HCT: 36.6 % (ref 36.0–46.0)
Hemoglobin: 12.6 g/dL (ref 12.0–15.0)
MCH: 33.1 pg (ref 26.0–34.0)
MCHC: 34.4 g/dL (ref 30.0–36.0)
MCV: 96.1 fL (ref 80.0–100.0)
Platelets: 147 10*3/uL — ABNORMAL LOW (ref 150–400)
RBC: 3.81 MIL/uL — ABNORMAL LOW (ref 3.87–5.11)
RDW: 13 % (ref 11.5–15.5)
WBC: 5.1 10*3/uL (ref 4.0–10.5)
nRBC: 0 % (ref 0.0–0.2)

## 2020-12-14 LAB — MAGNESIUM: Magnesium: 2 mg/dL (ref 1.7–2.4)

## 2020-12-14 LAB — BASIC METABOLIC PANEL
Anion gap: 7 (ref 5–15)
BUN: 24 mg/dL — ABNORMAL HIGH (ref 8–23)
CO2: 21 mmol/L — ABNORMAL LOW (ref 22–32)
Calcium: 9.3 mg/dL (ref 8.9–10.3)
Chloride: 103 mmol/L (ref 98–111)
Creatinine, Ser: 0.96 mg/dL (ref 0.44–1.00)
GFR, Estimated: >60 mL/min (ref >60)
Glucose, Bld: 145 mg/dL — ABNORMAL HIGH (ref 70–99)
Potassium: 4.9 mmol/L (ref 3.5–5.1)
Sodium: 131 mmol/L — ABNORMAL LOW (ref 135–145)

## 2020-12-14 LAB — PHOSPHORUS: Phosphorus: 4.1 mg/dL (ref 2.5–4.6)

## 2020-12-14 LAB — SURGICAL PATHOLOGY

## 2020-12-14 MED ORDER — METHOCARBAMOL 500 MG PO TABS
500.0000 mg | ORAL_TABLET | Freq: Three times a day (TID) | ORAL | Status: DC
  Administered 2020-12-14 – 2020-12-16 (×8): 500 mg via ORAL
  Filled 2020-12-14 (×8): qty 1

## 2020-12-14 MED ORDER — FERROUS SULFATE 325 (65 FE) MG PO TABS
325.0000 mg | ORAL_TABLET | Freq: Two times a day (BID) | ORAL | Status: DC
  Administered 2020-12-14 – 2020-12-16 (×6): 325 mg via ORAL
  Filled 2020-12-14 (×6): qty 1

## 2020-12-14 MED ORDER — CHOLESTYRAMINE LIGHT 4 G PO PACK
4.0000 g | PACK | Freq: Two times a day (BID) | ORAL | Status: DC
  Administered 2020-12-14 (×2): 4 g via ORAL
  Filled 2020-12-14 (×3): qty 1

## 2020-12-14 MED ORDER — CHOLESTYRAMINE LIGHT 4 G PO PACK
4.0000 g | PACK | Freq: Every day | ORAL | Status: DC
  Filled 2020-12-14: qty 1

## 2020-12-14 MED ORDER — LOPERAMIDE HCL 2 MG PO CAPS
2.0000 mg | ORAL_CAPSULE | Freq: Two times a day (BID) | ORAL | Status: DC
  Administered 2020-12-14 (×2): 2 mg via ORAL
  Filled 2020-12-14 (×2): qty 1

## 2020-12-14 MED ORDER — GABAPENTIN 300 MG PO CAPS
300.0000 mg | ORAL_CAPSULE | Freq: Three times a day (TID) | ORAL | Status: DC
  Administered 2020-12-14 (×3): 300 mg via ORAL
  Filled 2020-12-14 (×3): qty 1

## 2020-12-14 MED ORDER — LOPERAMIDE HCL 2 MG PO CAPS: 2.0000 mg | ORAL_CAPSULE | Freq: Three times a day (TID) | ORAL | Status: DC | PRN

## 2020-12-14 MED ORDER — FERROUS SULFATE 325 (65 FE) MG PO TABS: 325.0000 mg | ORAL_TABLET | Freq: Two times a day (BID) | ORAL | Status: DC

## 2020-12-14 MED ORDER — DICYCLOMINE HCL 10 MG PO CAPS
10.0000 mg | ORAL_CAPSULE | Freq: Every day | ORAL | Status: DC
  Administered 2020-12-14 – 2020-12-16 (×3): 10 mg via ORAL
  Filled 2020-12-14 (×3): qty 1

## 2020-12-14 NOTE — Progress Notes (Addendum)
Colleen Hancock [id number] [birth year]  CARE TEAM:  PCP: Velna Hatchet, MD  Outpatient Care Team: Patient Care Team: Velna Hatchet, MD as PCP - General (Internal Medicine) Michael Boston, MD as Consulting Physician (General Surgery) Milus Banister, MD as Attending Physician (Gastroenterology) Janyth Contes, MD as Consulting Physician (Obstetrics and Gynecology) Nicholas Lose, MD as Consulting Physician (Hematology and Oncology)  Inpatient Treatment Team: Treatment Team: Attending Provider: Michael Boston, MD; Occupational Therapist: Marcellina Millin, OT/L; Utilization Review: Alease Medina, RN; Charge Nurse: Aura Dials, RN; Pharmacist: Randa Spike, [facility]; Physical Therapist: Donalee Citrin, PT; Social Worker: Sherie Don, LCSW   Problem List:   Principal Problem:   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  Firm nodule at cecum near appendiceal orifice correlating with endoscopic and pathology findings.   No obvious metastatic disease on visceral parietal peritoneum or liver.   It is an isoperistaltic ileocolonic anastomosis that rests in the right lower quadrant region.   Assessment  Struggling with pain control and diarrhea in the setting of chronic IBS diarrhea  [facility] Stay = 5 days)  Plan:  -CT head negative for stroke.  BP okay & w/o focal deficits.  follow  -Enhanced recovery  protocol.  [facility]vance to solid diet  -Improve pain control.  Continue Tylenol.  Increase gabapentin.  Make methocarbamol scheduled.  Patient prefers tramadol for pain.  Nurse to offer 100s and see if that helps.  Again stressed the importance of asking for pain meds.  She just assumed the nurse would give it to her.  IBS regimen.  Continue cholestyramine.  Continue fiber.  Restart dicyclomine as she notes it helps.  Pepto not strong enough so we will add Imodium twice daily and as needed.  Add iron to see if that helps constipate see if this can improve.  -Follow-up on pathology.  Hopefully early stage cancer.  -Blood pressure control.  Patient feeling a little lightheaded at times.  Blood pressure on the low end.  Hold lisinopril and have as needed's as needed for now  -Probable mild chronic kidney disease.  Continue ACE inhibitor and follow.  Rechecked electrolyte  -Medlock IV fluids and follow with backup as needed fluid to avoid fluid overload.  -Anxiolysis as needed  -VTE prophylaxis- SCDs, etc  -mobilize as tolerated to help recovery.  I think that is a challenge.  We will have home health set up.  Patient more amenable to it.  Dr. Orpah Greek agrees and home health.  Disposition:  Disposition:  The patient is from: Home  Anticipate discharge to:  Home with Home Health  Anticipated Date of Discharge is:  October 10,2022   Barriers to discharge:  Pending Clinical improvement (more likely than not)  Patient currently is NOT MEDICALLY STABLE for discharge from the hospital from a surgery standpoint.      30 minutes spent in review, evaluation, examination, counseling, and  coordination of care.   I have reviewed this patient's available data, including medical history, events of note, physical examination and test results as part of my evaluation.  A significant portion of that time was spent in counseling.  Care during the described time interval was provided by me.  I discussion  with the patient and her daughter at bedside.  Daughter notes her mother can sometimes struggle to ask for help and suffers resolved.  Patient now more interested in home health.  We will work to arrange that.  12/14/2020    Subjective: (Chief complaint)  Struggling with loose bowel movements and moderately severe diarrhea.  Did not know she could ask for more medications.  Struggling with soreness.  Assumed that pain meds will be given to her and she did not need to ask.  Walking with some help in the hallways.  Daughter and nurse in room with me.  No nausea or vomiting.    Objective:  Vital signs:  Vitals:   12/13/20 2109 12/13/20 2249 12/14/20 0451 12/14/20 0500  BP: (!) 119/49 (!) 110/53 (!) 112/55   Pulse: 70 72 65   Resp: 16 18 18    Temp:  (!) 97.4 F (36.3 C)    TempSrc:  Oral    SpO2: 96% 94% 94%   Weight:    94.5 kg  Height:        Last BM Date: 12/14/20  Intake/Output   Yesterday:  10/09 0701 - 10/10 0700 In: 1980 [P.O.:1980] Out: -  This shift:  No intake/output data recorded.  Bowel function:  Flatus: YES  BM:  No  Drain: (No drain)   Physical Exam:  General: Pt awake/alert in no acute distress.  Not toxic but looks tired. Eyes: PERRL, normal EOM.  Sclera clear.  No icterus Neuro: CN II-XII intact w/o focal sensory/motor deficits. Lymph: No head/neck/groin lymphadenopathy Psych:  No delerium/psychosis/paranoia.  Oriented x 4 HENT: Normocephalic, Mucus membranes moist.  No thrush Neck: Supple, No tracheal deviation.  No obvious thyromegaly Chest: No pain to chest wall compression.  Good respiratory excursion.  No audible wheezing CV:  Pulses intact.  Regular rhythm.  No major extremity edema MS: Normal AROM mjr joints.  No obvious deformity  Abdomen: Soft.  Mildy distended.   Mild periumbilical discomfort.  Dressings clean dry and intact .  No evidence of peritonitis.  No incarcerated hernias.  Ext:   No deformity.  No mjr edema.  No  cyanosis Skin: No petechiae / purpurea.  No major sores.  Warm and dry    Results:   Cultures: Recent Results (from the past 720 hour(s))  SARS Coronavirus 2 (TAT 6-24 hrs)     Status: None   Collection Time: 12/08/20 12:00 AM  Result Value Ref Range Status   SARS Coronavirus 2 RESULT: NEGATIVE  Final    Comment: RESULT: NEGATIVESARS-CoV-2 INTERPRETATION:A NEGATIVE  test result means that SARS-CoV-2 RNA was not present in the specimen above the limit of detection of this test. This does not preclude a possible SARS-CoV-2 infection and should not be used as the  sole basis for patient management decisions. Negative results must be combined with clinical observations, patient history, and epidemiological information. Optimum specimen types and timing for peak viral levels during infections caused by SARS-CoV-2  have not been determined. Collection of multiple specimens or types of specimens may be necessary to detect virus. Improper specimen collection and handling, sequence variability under primers/probes, or organism present below the limit of detection  may  lead to false negative results. Positive and negative predictive values of testing are highly dependent on prevalence. False negative test results are more likely when prevalence of disease is high.The expected result is NEGATIVE.Fact S heet for  Healthcare Providers: LocalChronicle.no Sheet for Patients: SalonLookup.es Reference Range - Negative   Resp Panel by RT-PCR (Flu A&B, Covid) Nasopharyngeal Swab     Status: None   Collection Time: 12/09/20  7:59 PM   Specimen: Nasopharyngeal Swab; Nasopharyngeal(NP) swabs in vial transport medium  Result Value Ref Range Status   SARS Coronavirus 2 by RT PCR NEGATIVE NEGATIVE Final    Comment: (NOTE) SARS-CoV-2 target nucleic acids are NOT DETECTED.  The SARS-CoV-2 RNA is generally detectable in upper respiratory specimens during  the acute phase of infection. The lowest concentration of SARS-CoV-2 viral copies this assay can detect is 138 copies/mL. A negative result does not preclude SARS-Cov-2 infection and should not be used as the sole basis for treatment or other patient management decisions. A negative result may occur with  improper specimen collection/handling, submission of specimen other than nasopharyngeal swab, presence of viral mutation(s) within the areas targeted by this assay, and inadequate number of viral copies(<138 copies/mL). A negative result must be combined with clinical observations, patient history, and epidemiological information. The expected result is Negative.  Fact Sheet for Patients:  EntrepreneurPulse.com.au  Fact Sheet for Healthcare Providers:  IncredibleEmployment.be  This test is no t yet approved or cleared by the [place] FDA and  has been authorized for detection and/or diagnosis of SARS-CoV-2 by FDA under an Emergency Use Authorization (EUA). This EUA will remain  in effect (meaning this test can be used) for the duration of the COVID-19 declaration under Section 564(b)(1) of the Act, 21 U.S.C.section 360bbb-3(b)(1), unless the authorization is terminated  or revoked sooner.       Influenza A by PCR NEGATIVE NEGATIVE Final   Influenza B by PCR NEGATIVE NEGATIVE Final    Comment: (NOTE) The Xpert Xpress SARS-CoV-2/FLU/RSV plus assay is intended as an aid in the diagnosis of influenza from Nasopharyngeal swab specimens and should not be used as a sole basis for treatment. Nasal washings and aspirates are unacceptable for Xpert Xpress SARS-CoV-2/FLU/RSV testing.  Fact Sheet for Patients: EntrepreneurPulse.com.au  Fact Sheet for Healthcare Providers: IncredibleEmployment.be  This test is not yet approved or cleared by the [place] FDA and has been authorized for detection and/or  diagnosis of SARS-CoV-2 by FDA under an Emergency Use Authorization (EUA). This EUA will remain in effect (meaning this test can be used) for the duration of the COVID-19 declaration under Section 564(b)(1) of the Act, 21 U.S.C. section 360bbb-3(b)(1), unless the authorization is terminated or revoked.  Performed at [facility], [place] [address] [place], [place] [address]   Surgical PCR screen     Status: None   Collection Time: 12/10/20  3:42 AM   Specimen: Nasal Mucosa; Nasal Swab  Result Value Ref Range Status   MRSA, PCR NEGATIVE NEGATIVE Final   Staphylococcus aureus NEGATIVE NEGATIVE Final    Comment: (NOTE) The Xpert SA Assay (FDA approved for NASAL specimens in patients 33 years of age and older), is one component of a comprehensive surveillance program. It is not intended to diagnose infection nor to guide or monitor treatment. Performed at [facility], [place] [address], [place], [place] [address]     Labs: No results found for this or any previous visit (from the past 48 hour(s)).  Imaging / Studies: CT HEAD WO CONTRAST (5MM)  Result Date: 12/13/2020 CLINICAL DATA:  Dizziness, nonspecific EXAM: CT HEAD WITHOUT CONTRAST TECHNIQUE: Contiguous axial images were obtained from the base of the skull through the vertex without intravenous contrast. COMPARISON:  12/09/2020 FINDINGS: Brain: There is atrophy and chronic small vessel disease changes. No acute intracranial abnormality. Specifically, no hemorrhage, hydrocephalus, mass lesion, acute infarction, or significant intracranial injury. Vascular: No hyperdense vessel or unexpected calcification. Skull: No acute calvarial abnormality. Sinuses/Orbits: No acute findings Other: None IMPRESSION: Atrophy, chronic microvascular disease. No acute intracranial abnormality. Electronically Signed   By: Rolm Baptise M.D.   On: 12/13/2020 20:10    Medications / Allergies: per  chart  Antibiotics: Anti-infectives (From admission, onward)    Start     Dose/Rate Route Frequency Ordered Stop   12/10/20 2000  cefoTEtan (CEFOTAN) 2 g in sodium chloride 0.9 % 100 mL IVPB        2 g 200 mL/hr over 30 Minutes Intravenous Every 12 hours 12/10/20 1202 12/10/20 2200   12/10/20 1400  neomycin (MYCIFRADIN) tablet 1,000 mg  Status:  Discontinued       See Hyperspace for full Linked Orders Report.   1,000 mg Oral 3 times per day 12/10/20 0628 12/10/20 0638   12/10/20 1400  metroNIDAZOLE (FLAGYL) tablet 1,000 mg  Status:  Discontinued       See Hyperspace for full Linked Orders Report.   1,000 mg Oral 3 times per day 12/10/20 0628 12/10/20 0638   12/10/20 0715  cefoTEtan (CEFOTAN) 2 g in sodium chloride 0.9 % 100 mL IVPB        2 g 200 mL/hr over 30 Minutes Intravenous On call to O.R. 12/10/20 0628 12/10/20 0807   12/09/20 2200  neomycin (MYCIFRADIN) tablet 1,000 mg        1,000 mg Oral  Once 12/09/20 2047 12/10/20 0016   12/09/20 2200  metroNIDAZOLE (FLAGYL) tablet 500 mg        500 mg Oral  Once 12/09/20 2047 12/10/20 0015         Note: Portions of this report may have been transcribed using voice recognition software. Every effort was made to ensure accuracy; however, inadvertent computerized transcription errors may be present.   Any transcriptional errors that result from this process are unintentional.    Adin Hector, MD, FACS, MASCRS Esophageal, Gastrointestinal & Colorectal Surgery Robotic and Minimally Invasive Surgery  [facility], [place]  [place]. [address], [place], [place] [address] [phone] Fax [phone] [address]  CONTACT INFORMATION:  Weekday (9AM-5PM): Call [facility] main office at [phone]  Weeknight (5PM-9AM) or Weekend/Holiday: Check [email or web address] (password " [email or web address]") for General Surgery [facility] coverage  (Please, do not use SecureChat as it is not reliable communication to  operating surgeons for immediate patient care)      12/14/2020  8:42 AM

## 2020-12-14 NOTE — Progress Notes (Signed)
Physical Therapy Treatment Patient Details Name: Colleen Hancock MRN: [id number] DOB: [birth year] Today's Date: 12/14/2020   History of Present Illness Patient is a 75 year old female with cecal cancer s/p robotic proximal colectomy. PMH includes breast CA, arthritis, HTN    PT Comments    Pt limited by dizziness and abdominal soreness this session; BP slightly soft- RN notified. Pt limited in ambulation this session, continues to require min guard for safety with RW. Pt with difficulty sequencing log rolling with return to supine requiring min guard for safety and heavy cues. Assisted pt back to supine, pt verbalizes frustration with session, encouraged pt to ambulate or get up with nursing additional times today as feeling up to it.     Recommendations for follow up therapy are one component of a multi-disciplinary discharge planning process, led by the attending physician.  Recommendations may be updated based on patient status, additional functional criteria and insurance authorization.  Follow Up Recommendations  Home health PT     Equipment Recommendations  None recommended by PT    Recommendations for Other Services       Precautions / Restrictions Precautions Precautions: Fall Precaution Comments: abd sx Restrictions Weight Bearing Restrictions: No     Mobility  Bed Mobility Overal bed mobility: Needs Assistance Bed Mobility: Supine to Sit  Sidelying to sit: Supervision;HOB elevated Supine to sit: Min guard  General bed mobility comments: VCs for log rolling to rise with good performance, increased difficulty with returning to supine and pt hesitant to accept physical assist    Transfers Overall transfer level: Needs assistance Equipment used: Rolling walker (2 wheeled) Transfers: Sit to/from Stand Sit to Stand: Supervision  General transfer comment: BUE assisting, slow to rise and upright trunk  Ambulation/Gait Ambulation/Gait assistance: Min  guard Gait Distance (Feet): 10 Feet Assistive device: Rolling walker (2 wheeled) Gait Pattern/deviations: Step-through pattern;Decreased stride length;Trunk flexed Gait velocity: decreased   General Gait Details: very slow, steady steps in room, trunk flexed, no LOB, limited by dizziness and abdominal soreness complaints   Stairs             Wheelchair Mobility    Modified Rankin (Stroke Patients Only)       Balance Overall balance assessment: Needs assistance Sitting-balance support: Feet supported Sitting balance-Leahy Scale: Good Sitting balance - Comments: seated EOB   Standing balance support: No upper extremity supported Standing balance-Leahy Scale: Fair Standing balance comment: static without UE support, dynamic with RW    Cognition Arousal/Alertness: Awake/alert Behavior During Therapy: WFL for tasks assessed/performed Overall Cognitive Status: Within Functional Limits for tasks assessed     Exercises General Exercises - Lower Extremity Ankle Circles/Pumps: Supine;AROM;Both;20 reps    General Comments General comments (skin integrity, edema, etc.): Pt c/o dizziness while ambulating, returned to sitting and BP 101/63. Reclined pt in recliner, performed ankle pumps without improvement in dizziness, BP 107/53- assisted back to supine. Pt states "the room spins every time I move my head", RN notified of BP.      Pertinent Vitals/Pain Pain Assessment: Faces Faces Pain Scale: Hurts little more Pain Location: lower abdomen Pain Descriptors / Indicators: Sore;Aching Pain Intervention(s): Limited activity within patient's tolerance;Monitored during session;Premedicated before session;Repositioned    Home Living                      Prior Function            PT Goals (current goals can now be found in the care  plan section) Acute Rehab PT Goals Patient Stated Goal: get stronger PT Goal Formulation: With patient Time For Goal Achievement:  12/25/20 Potential to Achieve Goals: Good Progress towards PT goals: Progressing toward goals    Frequency    Min 3X/week      PT Plan Current plan remains appropriate    Co-evaluation              AM-PAC PT "6 Clicks" Mobility   Outcome Measure  Help needed turning from your back to your side while in a flat bed without using bedrails?: A Little Help needed moving from lying on your back to sitting on the side of a flat bed without using bedrails?: A Little Help needed moving to and from a bed to a chair (including a wheelchair)?: A Little Help needed standing up from a chair using your arms (e.g., wheelchair or bedside chair)?: A Little Help needed to walk in hospital room?: A Little Help needed climbing 3-5 steps with a railing? : A Little 6 Click Score: 18    End of Session Equipment Utilized During Treatment: Gait belt Activity Tolerance: Patient limited by pain;Other (comment) (dizziness) Patient left: in bed;with call bell/phone within reach Nurse Communication: Mobility status;Other (comment) (BP) PT Visit Diagnosis: Other abnormalities of gait and mobility (R26.89);Pain Pain - part of body:  (abdomen)     Time: 6147-0929 PT Time Calculation (min) (ACUTE ONLY): 26 min  Charges:  $Gait Training: 8-22 mins $Therapeutic Activity: 8-22 mins                     Tori Cardelia Sassano PT, DPT 12/14/20, 4:19 PM

## 2020-12-14 NOTE — TOC Transition Note (Signed)
Transition of Care [facility]) - CM/SW Discharge Note   Patient Details  Name: Colleen Hancock MRN: [id number] Date of Birth: 08/04/1945  [facility] CM/SW Contact:  Natasha Bence, LCSW Phone Number: 12/14/2020, 3:24 PM   Clinical Narrative:    Patient is a 75 year old female admitted for  Cancer of cecum s/p robotic colectomy. Patient is ambulatory at baseline. Patient agreeable to home health and expressed that they preferred [facility]. CSW referred [facility] to [facility]. TOC signing off.   Final next level of care: [place] Barriers to Discharge: Barriers Resolved   Patient Goals and CMS Choice Patient states their goals for this hospitalization and ongoing recovery are:: Return home with [facility] CMS Medicare.gov Compare Post Acute Care list provided to:: Patient Choice offered to / list presented to : Patient  Discharge Placement                       Discharge Plan and Services                          HH Arranged: PT, RN [facility] Agency: [place] ([place]) Date [place]: 12/14/20 Time [place]: 64 Representative spoke with at [place]: [place] ([place]) Interventions     Readmission Risk Interventions No flowsheet data found.

## 2020-12-14 NOTE — Care Management Important Message (Signed)
Important Message  Patient Details IM Letter given to the Patient. Name: Colleen Hancock MRN: [id number] Date of Birth: 28-Apr-1945   Medicare Important Message Given:  Yes     Kerin Salen 12/14/2020, 3:27 PM

## 2020-12-14 NOTE — Progress Notes (Signed)
Patient has a blister on lower left abdomen as well as redness on lower abdomen from tegaderm.

## 2020-12-14 NOTE — Plan of Care (Signed)
  Problem: Education: Goal: Knowledge of General Education information will improve Description: Including pain rating scale, medication(s)/side effects and non-pharmacologic comfort measures Outcome: Progressing   Problem: Health Behavior/Discharge Planning: Goal: Ability to manage health-related needs will improve Outcome: Progressing   Problem: Clinical Measurements: Goal: Ability to maintain clinical measurements within normal limits will improve Outcome: Progressing Goal: Will remain free from infection Outcome: Progressing Goal: Diagnostic test results will improve Outcome: Progressing Goal: Cardiovascular complication will be avoided Outcome: Progressing   Problem: Nutrition: Goal: Adequate nutrition will be maintained Outcome: Progressing   Problem: Elimination: Goal: Will not experience complications related to bowel motility Outcome: Progressing   Problem: Pain Managment: Goal: General experience of comfort will improve Outcome: Progressing   Problem: Safety: Goal: Ability to remain free from injury will improve Outcome: Progressing   Problem: Skin Integrity: Goal: Risk for impaired skin integrity will decrease Outcome: Progressing

## 2020-12-15 MED ORDER — DIPHENOXYLATE-ATROPINE 2.5-0.025 MG PO TABS: 1.0000 | ORAL_TABLET | Freq: Four times a day (QID) | ORAL | Status: DC | PRN

## 2020-12-15 MED ORDER — CHOLESTYRAMINE LIGHT 4 G PO PACK
4.0000 g | PACK | Freq: Every day | ORAL | Status: DC
  Administered 2020-12-16: 4 g via ORAL
  Filled 2020-12-15: qty 1

## 2020-12-15 MED ORDER — DIPHENOXYLATE-ATROPINE 2.5-0.025 MG PO TABS
1.0000 | ORAL_TABLET | Freq: Four times a day (QID) | ORAL | Status: DC
  Administered 2020-12-15 – 2020-12-16 (×6): 1 via ORAL
  Filled 2020-12-15 (×6): qty 1

## 2020-12-15 MED ORDER — LOPERAMIDE HCL 2 MG PO CAPS: 4.0000 mg | ORAL_CAPSULE | Freq: Two times a day (BID) | ORAL | Status: DC

## 2020-12-15 MED ORDER — LOPERAMIDE HCL 2 MG PO CAPS: 4.0000 mg | ORAL_CAPSULE | Freq: Two times a day (BID) | ORAL | Status: DC | PRN

## 2020-12-15 MED ORDER — GABAPENTIN 400 MG PO CAPS
400.0000 mg | ORAL_CAPSULE | Freq: Three times a day (TID) | ORAL | Status: DC
  Administered 2020-12-15 – 2020-12-16 (×5): 400 mg via ORAL
  Filled 2020-12-15 (×5): qty 1

## 2020-12-15 NOTE — Progress Notes (Signed)
Occupational Therapy Treatment Patient Details Name: Colleen Hancock MRN: [id number] DOB: Jul 28, 1945 Today's Date: 12/15/2020   History of present illness Patient is a 75 year old female with cecal cancer s/p robotic proximal colectomy. PMH includes breast CA, arthritis, HTN   OT comments  Patient noted to have increased constipation during session with attempting to void bowels x2 during session. Patient was min guard for standing to complete grooming tasks with patient able to participate in functional activity for over 10 mins prior to rest break. Patient's discharge plan remains appropriate at this time. OT will continue to follow acutely.      Recommendations for follow up therapy are one component of a multi-disciplinary discharge planning process, led by the attending physician.  Recommendations may be updated based on patient status, additional functional criteria and insurance authorization.    Follow Up Recommendations  No OT follow up;Other (comment)    Equipment Recommendations  Other (comment) (long handled AE)    Recommendations for Other Services      Precautions / Restrictions Precautions Precautions: Fall Precaution Comments: abd sx Restrictions Weight Bearing Restrictions: No       Mobility Bed Mobility               General bed mobility comments: patient was in recliner at start of session    Transfers Overall transfer level: Needs assistance Equipment used: Rolling walker (2 wheeled) Transfers: Sit to/from Stand Sit to Stand: Supervision         General transfer comment: education on proper hand placement    Balance Overall balance assessment: Mild deficits observed, not formally tested                                         ADL either performed or assessed with clinical judgement   ADL Overall ADL's : Needs assistance/impaired     Grooming: Wash/dry hands;Supervision/safety;Standing;Oral care;Wash/dry  face Grooming Details (indicate cue type and reason): patient was able to complete tasks standint at sink with education on proper hand placement with RW use.                 Toilet Transfer: Min guard;Cueing for safety;Ambulation;Regular Toilet;RW;Grab bars Toilet Transfer Details (indicate cue type and reason): patient needed education for proper hand placement for transfers on this date. patient completed toileting x2 during this session Toileting- Clothing Manipulation and Hygiene: Set up;Sit to/from stand Toileting - Clothing Manipulation Details (indicate cue type and reason): patient was able to complete task in standing with increased time     Functional mobility during ADLs: Min guard;Rolling walker General ADL Comments: patient was able to particiapte in functional activity tolerance in hallway for about 40 feet on this date with increased time and one standing rest break     Vision Baseline Vision/History: 1 Wears glasses Ability to See in Adequate Light: 1 Impaired Patient Visual Report: No change from baseline     Perception     Praxis      Cognition Arousal/Alertness: Awake/alert Behavior During Therapy: WFL for tasks assessed/performed Overall Cognitive Status: Within Functional Limits for tasks assessed                                          Exercises     Shoulder Instructions  General Comments      Pertinent Vitals/ Pain       Pain Score: 5  Pain Location: lower abdomen Pain Descriptors / Indicators: Sore;Aching Pain Intervention(s): Patient requesting pain meds-RN notified;Limited activity within patient's tolerance;Monitored during session  Home Living                                          Prior Functioning/Environment              Frequency  Min 2X/week        Progress Toward Goals  OT Goals(current goals can now be found in the care plan section)  Progress towards OT goals:  Progressing toward goals  Acute Rehab OT Goals Patient Stated Goal: get stronger  Plan Discharge plan remains appropriate    Co-evaluation                 AM-PAC OT "6 Clicks" Daily Activity     Outcome Measure   Help from another person eating meals?: None Help from another person taking care of personal grooming?: A Little Help from another person toileting, which includes using toliet, bedpan, or urinal?: A Little Help from another person bathing (including washing, rinsing, drying)?: A Lot Help from another person to put on and taking off regular upper body clothing?: A Little Help from another person to put on and taking off regular lower body clothing?: A Lot 6 Click Score: 17    End of Session Equipment Utilized During Treatment: Rolling walker;Gait belt  OT Visit Diagnosis: Other abnormalities of gait and mobility (R26.89);Unsteadiness on feet (R26.81);Pain   Activity Tolerance Patient tolerated treatment well   Patient Left in chair;with call bell/phone within reach   Nurse Communication Mobility status        Time: 5465-6812 OT Time Calculation (min): 30 min  Charges: OT General Charges $OT Visit: 1 Visit OT Treatments $Self Care/Home Management : 23-37 mins  Jackelyn Poling OTR/L, [place] Acute Rehabilitation Department Office# [phone] Pager# [phone]   [place] 12/15/2020, 10:45 AM

## 2020-12-15 NOTE — Progress Notes (Signed)
Mobility Specialist - Progress Note    12/15/20 1424  Mobility  Activity Ambulated in hall  Level of Assistance Contact guard assist, steadying assist  Assistive Device Front wheel walker  Distance Ambulated (ft) 70 ft  Mobility Ambulated with assistance in hallway  Mobility Response Tolerated well  Mobility performed by Mobility specialist  $Mobility charge 1 Mobility   Upon entry pt requested to use BSC and required contact guard assist to stand from recliner and transfer to [facility]. Pt then used RW to stand from [facility], wash hands, and walk towards hallway. Pt ambulated 70 ft in hallway and stated feeling dizzy, but it "was going away" on return trip. Pt returned to room and was left in bed with call bell at side.   [place] Specialist Acute Rehabilitation Services Phone: [phone] 12/15/20, 2:27 PM

## 2020-12-15 NOTE — Progress Notes (Signed)
Colleen Hancock [id number] 13-Apr-1945  CARE TEAM:  PCP: Velna Hatchet, MD  Outpatient Care Team: Patient Care Team: Velna Hatchet, MD as PCP - General (Internal Medicine) Michael Boston, MD as Consulting Physician (General Surgery) Milus Banister, MD as Attending Physician (Gastroenterology) Janyth Contes, MD as Consulting Physician (Obstetrics and Gynecology) Nicholas Lose, MD as Consulting Physician (Hematology and Oncology)  Inpatient Treatment Team: Treatment Team: Attending Provider: Michael Boston, MD; Charge Nurse: Sunny Schlein, RN; Utilization Review: Miringu, Mikael Spray, RN; Technician: Jennye Boroughs, RN; Occupational Therapist: Marcellina Millin, OT/L; Case Manager: Frann Rider, RN; Registered Nurse: Steward Ros, RN   Problem List:   Principal Problem:   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  Firm nodule at cecum near appendiceal orifice correlating with endoscopic and pathology findings.   No obvious metastatic disease on visceral parietal peritoneum or liver.   It is an isoperistaltic ileocolonic anastomosis that rests in the right lower quadrant region.   Assessment  Struggling with diarrhea in the setting of chronic IBS diarrhea  [facility] Stay = 6 days)  Plan:  -CT head negative for stroke.  BP okay & w/o focal deficits.  follow  -Enhanced recovery protocol.  Tolerating to solid  diet  -Improved pain control.  Continue Tylenol.  Increase gabapentin again.  Make methocarbamol scheduled.  Patient prefers tramadol for pain.  Nurse to offer 100s and see if that helps.  Again stressed the importance of asking for pain meds.  She just assumed the nurse would give it to her.  IBS regimen.  Continue cholestyramine - inc to BID.  Continue fiber.  Restarted dicyclomine as she notes it helps.  Iron BID.  Switch from loperamide to lomotil - see if this can improve.  Challenge with loss of ileocecal valve in the setting of IBS and fragile diarrhea.  -Follow-up on pathology showing no residual cancer & negative lymph nodes.  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  Patient feeling a little lightheaded at times.  Blood pressure on the low end.  Holding lisinopril and have as needed's as needed for now.  -Probable mild chronic kidney disease.  Holding ACE inhibitor and follow.  Rechecked electrolytes - OK.    -Medlock IV fluids and follow with backup as needed fluid to avoid fluid overload.  With her diarrhea, risk for dehydration.  However creatinine is stable and blood pressures improved argues against it.  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  Follow  -Anxiolysis as needed  -VTE prophylaxis- SCDs, etc  -mobilize as tolerated to help recovery.  I think that is a challenge.  We will have home health set up.  Patient more amenable to it.  Dr. Orpah Greek agrees and home health.  Disposition:  Disposition:  The patient is from: Home  Anticipate discharge to:  Home with Home Health  Anticipated  Date of Discharge is:  October 13,2022   Barriers to discharge:  Pending Clinical improvement (more likely than not)  Patient currently is NOT MEDICALLY STABLE for discharge from the hospital from a surgery standpoint.      30 minutes spent in review, evaluation, examination, counseling, and coordination of care.   I have reviewed this  patient's available data, including medical history, events of note, physical examination and test results as part of my evaluation.  A significant portion of that time was spent in counseling.  Care during the described time interval was provided by me.  I discussion with the patient and her daughter at bedside.  Daughter notes her mother can sometimes struggle to ask for help and suffers resolved.  Patient now more interested in home health.  We will work to arrange that.  12/15/2020    Subjective: (Chief complaint)  Patient had better daytime but then had a lot of diarrhea last night.  Able to get bedside commode.  No major accidents.  Did not ask for extra medication.  Still some soreness but seems to be better controlled.  Tolerating solid diet.  No nausea or vomiting.  Objective:  Vital signs:  Vitals:   12/14/20 1300 12/14/20 2038 12/15/20 0500 12/15/20 0557  BP: (!) 117/43 (!) 131/53  (!) 133/56  Pulse: 73 73  76  Resp: 18 18  18   Temp: 98.4 F (36.9 C) 98.5 F (36.9 C)  97.7 F (36.5 C)  TempSrc: Oral Oral    SpO2:  96%  95%  Weight:   92.3 kg   Height:        Last BM Date: 12/14/20  Intake/Output   Yesterday:  10/10 0701 - 10/11 0700 In: 880 [P.O.:880] Out: 0  This shift:  No intake/output data recorded.  Bowel function:  Flatus: YES  BM:  YES  Drain: (No drain)   Physical Exam:  General: Pt awake/alert in no acute distress.  Not toxic but looks tired. Eyes: PERRL, normal EOM.  Sclera clear.  No icterus Neuro: CN II-XII intact w/o focal sensory/motor deficits. Lymph: No head/neck/groin lymphadenopathy Psych:  No delerium/psychosis/paranoia.  Oriented x 4 HENT: Normocephalic, Mucus membranes moist.  No thrush Neck: Supple, No tracheal deviation.  No obvious thyromegaly Chest: No pain to chest wall compression.  Good respiratory excursion.  No audible wheezing CV:  Pulses intact.  Regular rhythm.  No major extremity edema MS: Normal AROM mjr  joints.  No obvious deformity  Abdomen: Soft.  Nondistended.  Mildly tender at incisions only.  No evidence of peritonitis.  No incarcerated hernias.  Ext:   No deformity.  No mjr edema.  No cyanosis Skin: No petechiae / purpurea.  No major sores.  Warm and dry    Results:   Cultures: Recent Results (from the past 720 hour(s))  SARS Coronavirus 2 (TAT 6-24 hrs)     Status: None   Collection Time: 12/08/20 12:00 AM  Result Value Ref Range Status   SARS Coronavirus 2 RESULT: NEGATIVE  Final    Comment: RESULT: NEGATIVESARS-CoV-2 INTERPRETATION:A NEGATIVE  test result means that SARS-CoV-2 RNA was not present in the specimen above the limit of detection of this test. This does not preclude a possible SARS-CoV-2 infection and should not be used as the  sole basis for patient management decisions. Negative results must be combined with clinical observations, patient history, and epidemiological information. Optimum specimen types and timing for peak viral levels during infections caused by SARS-CoV-2  have  not been determined. Collection of multiple specimens or types of specimens may be necessary to detect virus. Improper specimen collection and handling, sequence variability under primers/probes, or organism present below the limit of detection may  lead to false negative results. Positive and negative predictive values of testing are highly dependent on prevalence. False negative test results are more likely when prevalence of disease is high.The expected result is NEGATIVE.Fact S heet for  Healthcare Providers: LocalChronicle.no Sheet for Patients: SalonLookup.es Reference Range - Negative   Resp Panel by RT-PCR (Flu A&B, Covid) Nasopharyngeal Swab     Status: None   Collection Time: 12/09/20  7:59 PM   Specimen: Nasopharyngeal Swab; Nasopharyngeal(NP) swabs in vial transport medium  Result Value Ref Range Status   SARS  Coronavirus 2 by RT PCR NEGATIVE NEGATIVE Final    Comment: (NOTE) SARS-CoV-2 target nucleic acids are NOT DETECTED.  The SARS-CoV-2 RNA is generally detectable in upper respiratory specimens during the acute phase of infection. The lowest concentration of SARS-CoV-2 viral copies this assay can detect is 138 copies/mL. A negative result does not preclude SARS-Cov-2 infection and should not be used as the sole basis for treatment or other patient management decisions. A negative result may occur with  improper specimen collection/handling, submission of specimen other than nasopharyngeal swab, presence of viral mutation(s) within the areas targeted by this assay, and inadequate number of viral copies(<138 copies/mL). A negative result must be combined with clinical observations, patient history, and epidemiological information. The expected result is Negative.  Fact Sheet for Patients:  EntrepreneurPulse.com.au  Fact Sheet for Healthcare Providers:  IncredibleEmployment.be  This test is no t yet approved or cleared by the [place] FDA and  has been authorized for detection and/or diagnosis of SARS-CoV-2 by FDA under an Emergency Use Authorization (EUA). This EUA will remain  in effect (meaning this test can be used) for the duration of the COVID-19 declaration under Section 564(b)(1) of the Act, 21 U.S.C.section 360bbb-3(b)(1), unless the authorization is terminated  or revoked sooner.       Influenza A by PCR NEGATIVE NEGATIVE Final   Influenza B by PCR NEGATIVE NEGATIVE Final    Comment: (NOTE) The Xpert Xpress SARS-CoV-2/FLU/RSV plus assay is intended as an aid in the diagnosis of influenza from Nasopharyngeal swab specimens and should not be used as a sole basis for treatment. Nasal washings and aspirates are unacceptable for Xpert Xpress SARS-CoV-2/FLU/RSV testing.  Fact Sheet for  Patients: EntrepreneurPulse.com.au  Fact Sheet for Healthcare Providers: IncredibleEmployment.be  This test is not yet approved or cleared by the [place] FDA and has been authorized for detection and/or diagnosis of SARS-CoV-2 by FDA under an Emergency Use Authorization (EUA). This EUA will remain in effect (meaning this test can be used) for the duration of the COVID-19 declaration under Section 564(b)(1) of the Act, 21 U.S.C. section 360bbb-3(b)(1), unless the authorization is terminated or revoked.  Performed at [facility], [place] [address], [place], [place] [address]   Surgical PCR screen     Status: None   Collection Time: 12/10/20  3:42 AM   Specimen: Nasal Mucosa; Nasal Swab  Result Value Ref Range Status   MRSA, PCR NEGATIVE NEGATIVE Final   Staphylococcus aureus NEGATIVE NEGATIVE Final    Comment: (NOTE) The Xpert SA Assay (FDA approved for NASAL specimens in patients 41 years of age and older), is one component of a comprehensive surveillance program. It is not intended to diagnose infection nor to guide or monitor  treatment. Performed at [facility], [place] [address] [place], [place] [address]     Labs: Results for orders placed or performed during the hospital encounter of 12/09/20 (from the past 48 hour(s))  Basic metabolic panel     Status: Abnormal   Collection Time: 12/14/20 10:31 AM  Result Value Ref Range   Sodium 131 (L) 135 - 145 mmol/L   Potassium 4.9 3.5 - 5.1 mmol/L   Chloride 103 98 - 111 mmol/L   CO2 21 (L) 22 - 32 mmol/L   Glucose, Bld 145 (H) 70 - 99 mg/dL    Comment: Glucose reference range applies only to samples taken after fasting for at least 8 hours.   BUN 24 (H) 8 - 23 mg/dL   Creatinine, Ser 0.96 0.44 - 1.00 mg/dL   Calcium 9.3 8.9 - 10.3 mg/dL   GFR, Estimated >60 >60 mL/min    Comment: (NOTE) Calculated using the CKD-EPI Creatinine Equation (2021)     Anion gap 7 5 - 15    Comment: Performed at [facility], [place] [address] [place], [place] [address]  Phosphorus     Status: None   Collection Time: 12/14/20 10:31 AM  Result Value Ref Range   Phosphorus 4.1 2.5 - 4.6 mg/dL    Comment: Performed at [facility], [place] [address], [place], [place] [address]  Magnesium     Status: None   Collection Time: 12/14/20 10:31 AM  Result Value Ref Range   Magnesium 2.0 1.7 - 2.4 mg/dL    Comment: Performed at [facility], [place] [address] [place], [place] [address]  CBC     Status: Abnormal   Collection Time: 12/14/20 10:31 AM  Result Value Ref Range   WBC 5.1 4.0 - 10.5 K/uL   RBC 3.81 (L) 3.87 - 5.11 MIL/uL   Hemoglobin 12.6 12.0 - 15.0 g/dL   HCT 36.6 36.0 - 46.0 %   MCV 96.1 80.0 - 100.0 fL   MCH 33.1 26.0 - 34.0 pg   MCHC 34.4 30.0 - 36.0 g/dL   RDW 13.0 11.5 - 15.5 %   Platelets 147 (L) 150 - 400 K/uL   nRBC 0.0 0.0 - 0.2 %    Comment: Performed at [facility], [place] [address] [place], [place] [address]     Imaging / Studies: CT HEAD WO CONTRAST (5MM)  Result Date: 12/13/2020 CLINICAL DATA:  Dizziness, nonspecific EXAM: CT HEAD WITHOUT CONTRAST TECHNIQUE: Contiguous axial images were obtained from the base of the skull through the vertex without intravenous contrast. COMPARISON:  12/09/2020 FINDINGS: Brain: There is atrophy and chronic small vessel disease changes. No acute intracranial abnormality. Specifically, no hemorrhage, hydrocephalus, mass lesion, acute infarction, or significant intracranial injury. Vascular: No hyperdense vessel or unexpected calcification. Skull: No acute calvarial abnormality. Sinuses/Orbits: No acute findings Other: None IMPRESSION: Atrophy, chronic microvascular disease. No acute intracranial abnormality. Electronically Signed   By: Rolm Baptise M.D.   On: 12/13/2020 20:10    Medications / Allergies: per  chart  Antibiotics: Anti-infectives (From admission, onward)    Start     Dose/Rate Route Frequency Ordered Stop   12/10/20 2000  cefoTEtan (CEFOTAN) 2 g in sodium chloride 0.9 % 100 mL IVPB        2 g 200 mL/hr over 30 Minutes Intravenous Every 12 hours 12/10/20 1202 12/10/20 2200   12/10/20 1400  neomycin (MYCIFRADIN) tablet 1,000 mg  Status:  Discontinued  See Hyperspace for full Linked Orders Report.   1,000 mg Oral 3 times per day 12/10/20 0628 12/10/20 0638   12/10/20 1400  metroNIDAZOLE (FLAGYL) tablet 1,000 mg  Status:  Discontinued       See Hyperspace for full Linked Orders Report.   1,000 mg Oral 3 times per day 12/10/20 0628 12/10/20 0638   12/10/20 0715  cefoTEtan (CEFOTAN) 2 g in sodium chloride 0.9 % 100 mL IVPB        2 g 200 mL/hr over 30 Minutes Intravenous On call to O.R. 12/10/20 0628 12/10/20 0807   12/09/20 2200  neomycin (MYCIFRADIN) tablet 1,000 mg        1,000 mg Oral  Once 12/09/20 2047 12/10/20 0016   12/09/20 2200  metroNIDAZOLE (FLAGYL) tablet 500 mg        500 mg Oral  Once 12/09/20 2047 12/10/20 0015         Note: Portions of this report may have been transcribed using voice recognition software. Every effort was made to ensure accuracy; however, inadvertent computerized transcription errors may be present.   Any transcriptional errors that result from this process are unintentional.    Adin Hector, MD, FACS, MASCRS Esophageal, Gastrointestinal & Colorectal Surgery Robotic and Minimally Invasive Surgery  [facility], [place]. [address], [place], [place] [address] [phone] Fax [phone] Main  CONTACT INFORMATION:  Weekday (9AM-5PM): Call [facility] main office at [phone]  Weeknight (5PM-9AM) or Weekend/Holiday: Check [email or web address] (password " [email or web address]") for General Surgery [facility] coverage  (Please, do not use SecureChat as it is not reliable communication to  operating surgeons for immediate patient care)      12/15/2020  8:11 AM

## 2020-12-16 MED ORDER — FERROUS SULFATE 325 (65 FE) MG PO TABS: 325.0000 mg | ORAL_TABLET | Freq: Two times a day (BID) | ORAL | 2 refills | Status: DC

## 2020-12-16 MED ORDER — TRAMADOL HCL 50 MG PO TABS: 50.0000 mg | ORAL_TABLET | Freq: Four times a day (QID) | ORAL | 0 refills | Status: AC | PRN

## 2020-12-16 MED ORDER — DIPHENOXYLATE-ATROPINE 2.5-0.025 MG PO TABS: 1.0000 | ORAL_TABLET | Freq: Two times a day (BID) | ORAL | 5 refills | Status: DC

## 2020-12-16 MED ORDER — DIPHENOXYLATE-ATROPINE 2.5-0.025 MG PO TABS: 1.0000 | ORAL_TABLET | Freq: Four times a day (QID) | ORAL | 10 refills | Status: DC | PRN

## 2020-12-16 MED ORDER — GABAPENTIN 300 MG PO CAPS: 300.0000 mg | ORAL_CAPSULE | Freq: Three times a day (TID) | ORAL | 3 refills | Status: DC

## 2020-12-16 NOTE — Progress Notes (Signed)
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 PMH includes breast CA, arthritis, HTN   OT comments  Patient was able to participate in LB dressing tasks with AE with supervision after education on AE. Patient was educated on total hip kit. Patient reported she thinks she has these items at home. Patient recommended to have family support going home to assist with husbands care. Patient's discharge plan remains appropriate at this time. OT will continue to follow acutely.      Recommendations for follow up therapy are one component of a multi-disciplinary discharge planning process, led by the attending physician.  Recommendations may be updated based on patient status, additional functional criteria and insurance authorization.    Follow Up Recommendations  Home health OT    Equipment Recommendations  Other (comment)    Recommendations for Other Services      Precautions / Restrictions Precautions Precautions: Fall Precaution Comments: abd sx Restrictions Weight Bearing Restrictions: No       Mobility Bed Mobility               General bed mobility comments: patient was in recliner at start of session    Transfers Overall transfer level: Needs assistance Equipment used: Rolling walker (2 wheeled) Transfers: Sit to/from Stand Sit to Stand: Supervision         General transfer comment: education on proper hand placement    Balance Overall balance assessment: Needs assistance Sitting-balance support: Feet supported Sitting balance-Leahy Scale: Good     Standing balance support: No upper extremity supported Standing balance-Leahy Scale: Fair                             ADL either performed or assessed with clinical judgement   ADL Overall ADL's :  Needs assistance/impaired                       Lower Body Dressing Details (indicate cue type and reason): patient was educated on using AE for LB dressing with patietn able to don pants with min guard with reacher. patietn was able to don/doff socks with reacher and sock aid with supervision on this date. patient reported having these items at home already. patient was educated on getting a total hip kit if she did nto have an items at home. patient verbalized understanding.             Functional mobility during ADLs: Min guard;Rolling walker General ADL Comments: patient requested to participate in functional mobility in hallway on this date. patient was noted to have moment of patient reporting unsteadiness that was not observed by therapist.  with patient reaching for wall that was too far away without warning. patient was educated on importance of keeping BUE on walker and using it for support. patient verbalized understanding. patient was able to take standing rest break and return to recliner with no LOB. nurse was made aware. patient did express concerns over going home to care for husband at this time. patient was encouraged to speak with SW and family to get more assistance. nursing made aware.     Vision Baseline Vision/History: 1 Wears glasses Ability to See in Adequate Light: 1 Impaired Patient Visual Report: No change from baseline     Perception  Praxis      Cognition Arousal/Alertness: Awake/alert Behavior During Therapy: WFL for tasks assessed/performed Overall Cognitive Status: Within Functional Limits for tasks assessed                                          Exercises     Shoulder Instructions       General Comments      Pertinent Vitals/ Pain       Pain Assessment: Faces Faces Pain Scale: Hurts little more Pain Location: lower abdomen Pain Descriptors / Indicators: Sore;Aching Pain Intervention(s): Limited activity  within patient's tolerance;Patient requesting pain meds-RN notified  Home Living                                          Prior Functioning/Environment              Frequency  Min 2X/week        Progress Toward Goals  OT Goals(current goals can now be found in the care plan section)  Progress towards OT goals: Progressing toward goals  Acute Rehab OT Goals Patient Stated Goal: get stronger  Plan Discharge plan needs to be updated;Frequency remains appropriate    Co-evaluation                 AM-PAC OT "6 Clicks" Daily Activity     Outcome Measure   Help from another person eating meals?: None Help from another person taking care of personal grooming?: A Little Help from another person toileting, which includes using toliet, bedpan, or urinal?: A Little Help from another person bathing (including washing, rinsing, drying)?: A Little Help from another person to put on and taking off regular upper body clothing?: A Little Help from another person to put on and taking off regular lower body clothing?: A Little 6 Click Score: 19    End of Session Equipment Utilized During Treatment: Rolling walker  OT Visit Diagnosis: Other abnormalities of gait and mobility (R26.89);Unsteadiness on feet (R26.81);Pain   Activity Tolerance Patient tolerated treatment well   Patient Left in chair;with call bell/phone within reach   Nurse Communication Mobility status;Patient requests pain meds        Time: 2481-8590 OT Time Calculation (min): 31 min  Charges: OT General Charges $OT Visit: 1 Visit OT Treatments $Self Care/Home Management : 23-37 mins  Jackelyn Poling OTR/L, [place] [facility] Department Office# [phone] Pager# [phone]   [place] 12/16/2020, 11:39 AM

## 2020-12-16 NOTE — Discharge Summary (Signed)
Physician Discharge Summary    Patient ID: Colleen Hancock MRN: [id number] DOB/AGE: Nov 07, 1945  75 y.o.  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  Hospital Course:   The patient underwent the surgery above.  Postoperatively, the patient gradually mobilized and advanced to a solid diet.  Pain and other symptoms were treated aggressively.  Patient struggled with some lightheadedness and double vision at one point.  Negative CT of head.  No from neurological deficits.  Blood pressure medications held.  No evidence of orthostasis.  Patient struggled with worsening diarrhea on top of her chronic irritable bowel syndrome/diarrhea.  Placed on home regimen of dicyclomine and cholestyramine.  I added fiber, iron, and antidiarrheal.  Lomotil seemed to work better than loperamide.  Pathology came back showing no residual cancer with negative  lymph nodes.  Consistent with stage I early cancer.  Discussed with patient.  By the time of discharge, the patient was walking with assistance in the hallways, eating food, having flatus.  Pain was well-controlled on an oral medications.  Based on meeting discharge criteria and continuing to recover, I felt it was safe for the patient to be discharged from the hospital to further recover with close followup.  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  Children involved.  Did not want to go to a skilled nursing facility.  She agreed to home health therapy.  Social work and Lawyer reached out for support to try and help gradual transition to home.  Daughter involved postoperative recommendations were discussed in detail.  They are written as well.  Discharged Condition: fair -which is her baseline  Discharge Exam: Blood pressure (!) 160/55, pulse 88, temperature 98.8 F (37.1 C), temperature source Oral, resp. rate 16, height 5' 3.75" (1.619 m), weight 93.3 kg, SpO2 98 %.  General: Pt awake/alert/oriented x4 in No acute distress Eyes: PERRL, normal EOM.  Sclera clear.  No icterus Neuro: CN II-XII intact w/o focal sensory/motor deficits. Lymph: No head/neck/groin lymphadenopathy Psych:  No delerium/psychosis/paranoia HENT: Normocephalic, Mucus membranes moist.  No thrush Neck: Supple, No tracheal deviation Chest:  No chest wall pain w good excursion CV:  Pulses intact.  Regular rhythm MS: Normal AROM mjr joints.  No obvious deformity Abdomen: Soft.  Nondistended.  Mildly tender at incisions only.  No evidence of peritonitis.  No incarcerated hernias. Ext:  SCDs BLE.  No mjr edema.  No cyanosis Skin: No petechiae / purpura   Disposition:  Follow-up Information     Michael Boston, MD Follow up in 3 week(s).   Specialties: General Surgery, Colon and Rectal Surgery Why: To follow up after your operation Contact information: [place] [address] [facility].   Specialty: Home Health Services Why: PT                Discharge disposition: 01-Home or Self Care       Discharge Instructions     Call MD for:   Complete by: As directed    FEVER > 101.5 F  (temperatures < 101.5 F are not significant)   Call MD for:  extreme fatigue   Complete by: As directed    Call MD for:  persistant dizziness or light-headedness   Complete by: As directed    Call MD for:  persistant nausea and vomiting   Complete by: As directed    Call MD for:  redness, tenderness, or signs of infection (pain, swelling, redness, odor or green/yellow discharge around incision site)   Complete by: As directed    Call MD for:  severe uncontrolled pain   Complete by: As directed    Diet - low sodium heart healthy   Complete by: As directed    Start with a bland diet such as soups, liquids, starchy foods, low fat foods, etc. the first few days at home. Gradually advance to a solid, low-fat, high fiber diet by the end of the first week at home.   Add a fiber supplement to your diet (Metamucil, etc) If you feel full, bloated, or constipated, stay on a full liquid or pureed/blenderized diet for a few days until you feel better and are no longer constipated.   Discharge instructions   Complete by: As directed    See Discharge Instructions If you are not getting better after two weeks or are noticing you are getting worse, contact our office [phone] for further advice.  We may need to adjust your medications, re-evaluate you in the office, send you to the emergency room, or see what other things we can do to help. The clinic staff is available to answer your questions during regular business hours (8:30am-5pm).  Please don't hesitate to call and ask to speak to one of our nurses for clinical concerns.    A surgeon from [facility] is always on  call at the hospitals 24 hours/day If you have a medical emergency, go to the nearest emergency room or call 911.   Discharge wound care:   Complete by: As directed    It is good for closed incisions and even open wounds to be washed every day.  Shower every day.  Short baths are fine.  Wash the incisions and wounds clean with soap & water.    You may leave closed incisions open to air if it is dry.   You may cover the incision with clean gauze & replace it after your daily shower for comfort.     Driving Restrictions   Complete by: As directed    You may drive when: - you are no longer taking narcotic prescription pain medication - you can comfortably wear a seatbelt - you can safely make sudden turns/stops without pain.   Increase activity slowly   Complete by: As directed    Start light daily activities --- self-care, walking, climbing stairs- beginning the day  after surgery.  Gradually increase activities as tolerated.  Control your pain to be active.  Stop when you are tired.  Ideally, walk several times a day, eventually an hour a day.   Most people are back to most day-to-day activities in a few weeks.  It takes 4-6 weeks to get back to unrestricted, intense activity. If you can walk 30 minutes without difficulty, it is safe to try more intense activity such as jogging, treadmill, bicycling, low-impact aerobics, swimming, etc. Save the most intensive and strenuous activity for last (Usually 4-8 weeks after surgery) such as sit-ups, heavy lifting, contact sports, etc.  Refrain from any intense heavy lifting or straining until you are off narcotics for pain control.  You will have off days, but things should improve week-by-week. DO NOT PUSH THROUGH PAIN.  Let pain be your guide: If it hurts to do something, don't do it.   Lifting restrictions   Complete by: As directed    If you can walk 30 minutes without difficulty, it is safe to try more intense activity such as jogging, treadmill,  bicycling, low-impact aerobics, swimming, etc. Save the most intensive and strenuous activity for last (Usually 4-8 weeks after surgery) such as sit-ups, heavy lifting, contact sports, etc.   Refrain from any intense heavy lifting or straining until you are off narcotics for pain control.  You will have off days, but things should improve week-by-week. DO NOT PUSH THROUGH PAIN.  Let pain be your guide: If it hurts to do something, don't do it.  Pain is your body warning you to avoid that activity for another week until the pain goes down.   May shower / Bathe   Complete by: As directed    May walk up steps   Complete by: As directed    Sexual Activity Restrictions   Complete by: As directed    You may have sexual intercourse when it is comfortable. If it hurts to do something, stop.       Allergies as of 12/16/2020       Reactions   Fentanyl Other (See Comments)   Low blood pressure.   Tetracyclines & Related Hives   Other    SEAFOOD > UNSPECIFIED REACTION    Sodium Lactate Diarrhea   Sucrose    Artificial sugars   Clams [shellfish Allergy] Diarrhea   Nsaids Nausea And Vomiting   Stomach upset /had hx of stomach ulcers   Pentazocine Lactate Nausea And Vomiting   [place]win n/v tunnel vision   Tylenol [acetaminophen] Other (See Comments)   Worried may cause liver problems - 3g/day max        Medication List     TAKE these medications    albuterol 108 (90 Base) MCG/ACT inhaler Commonly known as: VENTOLIN HFA Inhale 1 puff into the lungs every 6 (six) hours as needed for wheezing or shortness of breath (Asthma).   atorvastatin 20 MG tablet Commonly known as: LIPITOR Take 20 mg by mouth daily before breakfast.   cetirizine 10 MG tablet Commonly known as: ZyrTEC Allergy Take 1 tablet (10 mg total) by mouth daily. What changed: when to take this   cholestyramine 4 GM/DOSE powder Commonly known as: QUESTRAN Take 0.75 packets by mouth daily after breakfast.    dicyclomine 10 MG capsule Commonly known as: BENTYL Take 10 mg by mouth daily at 6 (six) AM. (1st thing in the morning)   diphenoxylate-atropine 2.5-0.025 MG tablet Commonly known as: LOMOTIL Take 1 tablet by mouth  in the morning and at bedtime.   diphenoxylate-atropine 2.5-0.025 MG tablet Commonly known as: LOMOTIL Take 1 tablet by mouth 4 (four) times daily as needed for diarrhea or loose stools.   ferrous sulfate 325 (65 FE) MG tablet Take 1 tablet (325 mg total) by mouth 2 (two) times daily with a meal.   Fish Oil 1200 MG Caps Take 1,200 mg by mouth every evening.   fluticasone 50 MCG/ACT nasal spray Commonly known as: FLONASE Place 1 spray into both nostrils daily as needed.   gabapentin 300 MG capsule Commonly known as: NEURONTIN Take 1 capsule (300 mg total) by mouth 3 (three) times daily.   lisinopril 20 MG tablet Commonly known as: ZESTRIL Take 20 mg by mouth at bedtime.   MULTIVITAMIN GUMMIES WOMENS PO Take 1 tablet by mouth every evening. One-A-Day Women's  pill   Systane 0.4-0.3 % Soln Generic drug: Polyethyl Glycol-Propyl Glycol Place 1 drop into both eyes daily as needed (dry eyes).   traMADol 50 MG tablet Commonly known as: ULTRAM Take 1-2 tablets (50-100 mg total) by mouth every 6 (six) hours as needed for moderate pain (for pain.). What changed:  how much to take Another medication with the same name was removed. Continue taking this medication, and follow the directions you see here.               Discharge Care Instructions  (From admission, onward)           Start     Ordered   12/10/20 0000  Discharge wound care:       Comments: It is good for closed incisions and even open wounds to be washed every day.  Shower every day.  Short baths are fine.  Wash the incisions and wounds clean with soap & water.    You may leave closed incisions open to air if it is dry.   You may cover the incision with clean gauze & replace it after your  daily shower for comfort.     12/10/20 0737            Significant Diagnostic Studies:  Results for orders placed or performed during the hospital encounter of 12/09/20 (from the past 72 hour(s))  Basic metabolic panel     Status: Abnormal   Collection Time: 12/14/20 10:31 AM  Result Value Ref Range   Sodium 131 (L) 135 - 145 mmol/L   Potassium 4.9 3.5 - 5.1 mmol/L   Chloride 103 98 - 111 mmol/L   CO2 21 (L) 22 - 32 mmol/L   Glucose, Bld 145 (H) 70 - 99 mg/dL    Comment: Glucose reference range applies only to samples taken after fasting for at least 8 hours.   BUN 24 (H) 8 - 23 mg/dL   Creatinine, Ser 0.96 0.44 - 1.00 mg/dL   Calcium 9.3 8.9 - 10.3 mg/dL   GFR, Estimated >60 >60 mL/min    Comment: (NOTE) Calculated using the CKD-EPI Creatinine Equation (2021)    Anion gap 7 5 - 15    Comment: Performed at [facility], [place] [address] [place], [place] [address]  Phosphorus     Status: None   Collection Time: 12/14/20 10:31 AM  Result Value Ref Range   Phosphorus 4.1 2.5 - 4.6 mg/dL    Comment: Performed at [facility], [place] [address], [place], [place] [address]  Magnesium     Status: None   Collection Time: 12/14/20 10:31 AM  Result Value Ref Range   Magnesium 2.0 1.7 - 2.4 mg/dL    Comment: Performed at [facility], [place] [address] [place], [place] [address]  CBC     Status: Abnormal   Collection Time: 12/14/20 10:31 AM  Result Value Ref Range   WBC 5.1 4.0 - 10.5 K/uL   RBC 3.81 (L) 3.87 - 5.11 MIL/uL   Hemoglobin 12.6 12.0 - 15.0 g/dL   HCT 36.6 36.0 - 46.0 %   MCV 96.1 80.0 - 100.0 fL   MCH 33.1 26.0 - 34.0 pg   MCHC 34.4 30.0 - 36.0 g/dL   RDW 13.0 11.5 - 15.5 %   Platelets 147 (L) 150 - 400 K/uL   nRBC 0.0 0.0 - 0.2 %    Comment: Performed at [facility], [place] [address] [place], [place] [address]    CT Head Wo Contrast  Result Date: 12/09/2020 CLINICAL DATA:  Syncopal  episode. EXAM: CT HEAD WITHOUT CONTRAST CT CERVICAL SPINE WITHOUT CONTRAST TECHNIQUE: Multidetector CT imaging of the head and cervical spine was performed following the standard protocol without intravenous contrast. Multiplanar CT image reconstructions of the cervical spine were also generated. COMPARISON:  CT chest 11/05/2020 FINDINGS: CT HEAD FINDINGS BRAIN: BRAIN Cerebral ventricle sizes are concordant with the degree of cerebral volume loss. Patchy and confluent areas of decreased attenuation are noted throughout the deep and periventricular white matter of the cerebral hemispheres bilaterally, compatible with chronic microvascular ischemic disease. No evidence of large-territorial acute infarction. No parenchymal hemorrhage. No mass lesion. No extra-axial collection. No mass effect or midline shift. No hydrocephalus. Basilar cisterns are patent. Vascular: No hyperdense vessel. Skull: No acute fracture or focal lesion. Sinuses/Orbits: Paranasal sinuses and mastoid air cells are clear. The orbits are unremarkable. Other: None. CT CERVICAL SPINE FINDINGS Alignment: Straightening of normal cervical lordosis likely due to positioning and degenerative changes. Grade 1 anterolisthesis of due foreign C5. Skull base and vertebrae: Multilevel at least moderate degenerative changes of the spine most prominent at the C6-C7 levels. No acute fracture. No aggressive appearing focal osseous lesion or focal pathologic process. Soft tissues and spinal canal: No prevertebral fluid or swelling. No visible canal hematoma. Upper chest: Similar-appearing right apical scarring. Other: Partially retropharyngeal right common carotid artery. IMPRESSION: 1. No acute intracranial abnormality. 2. No acute displaced fracture or traumatic listhesis of the cervical spine. Electronically Signed   By: Iven Finn M.D.   On: 12/09/2020 19:54   CT Cervical Spine Wo Contrast  Result Date: 12/09/2020 CLINICAL DATA:  Syncopal episode. EXAM:  CT HEAD WITHOUT CONTRAST CT CERVICAL SPINE WITHOUT CONTRAST TECHNIQUE: Multidetector CT imaging of the head and cervical spine was performed following the standard protocol without intravenous contrast. Multiplanar CT image reconstructions of the cervical spine were also generated. COMPARISON:  CT chest 11/05/2020 FINDINGS: CT HEAD FINDINGS BRAIN: BRAIN Cerebral ventricle sizes are concordant with the degree of cerebral volume loss. Patchy and confluent areas of decreased attenuation are noted throughout the deep and periventricular white matter of the cerebral hemispheres bilaterally, compatible with chronic microvascular ischemic disease. No evidence of large-territorial acute infarction. No parenchymal hemorrhage. No mass lesion. No extra-axial collection. No mass effect or midline shift. No hydrocephalus. Basilar cisterns are patent. Vascular: No hyperdense vessel. Skull: No acute fracture or focal lesion. Sinuses/Orbits: Paranasal sinuses and mastoid air cells are clear. The orbits are unremarkable. Other: None. CT CERVICAL SPINE FINDINGS Alignment: Straightening of normal cervical lordosis likely due to positioning and degenerative changes.  Grade 1 anterolisthesis of due foreign C5. Skull base and vertebrae: Multilevel at least moderate degenerative changes of the spine most prominent at the C6-C7 levels. No acute fracture. No aggressive appearing focal osseous lesion or focal pathologic process. Soft tissues and spinal canal: No prevertebral fluid or swelling. No visible canal hematoma. Upper chest: Similar-appearing right apical scarring. Other: Partially retropharyngeal right common carotid artery. IMPRESSION: 1. No acute intracranial abnormality. 2. No acute displaced fracture or traumatic listhesis of the cervical spine. Electronically Signed   By: Iven Finn M.D.   On: 12/09/2020 19:54   DG Chest Port 1 View  Result Date: 12/09/2020 CLINICAL DATA:  Syncopal episode, history of colon cancer  scheduled for partial colectomy tomorrow, started bowel prep at this morning and was having persistent uncontrolled diarrhea. Past history hypertension, breast cancer EXAM: PORTABLE CHEST 1 VIEW COMPARISON:  Portable exam 2024 hours compared to 04/11/2014 FINDINGS: Normal heart size, mediastinal contours, and pulmonary vascularity. Atherosclerotic calcification aorta. Kyphotic positioning. Bibasilar atelectasis. Remaining lungs clear. No pleural effusion or pneumothorax. Bones demineralized. IMPRESSION: Bibasilar atelectasis. Aortic Atherosclerosis (ICD10-I70.0). Electronically Signed   By: Lavonia Dana M.D.   On: 12/09/2020 20:36    Past Medical History:  Diagnosis Date   Allergy    Anxiety 09/14/2020   Arthritis    hands, knees   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   Breast CA ([place]) 10/2012   Left Breast   Complication of anesthesia    during colonoscopy fentanyl dropped blood pressure 2013 or 2014   Diverticulitis 09/14/2020   no meds currently   Dyspnea 09/14/2020   on exertion can climb flight of steps   Fatty liver 09/14/2020   GERD (gastroesophageal reflux disease)    Heart murmur 09/14/2020   mild no cardiologist   High cholesterol 09/14/2020   History of migraine    none since 1990's   Hypertension 09/14/2020   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   with diarrhea   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   Pneumonia    yrs ago per pt on 09-14-2020   Swelling of joint of left shoulder 09/14/2020   swelling under left shoulder, 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   Thickened endometrium 09/14/2020   Wears glasses 09/14/2020    Past Surgical History:  Procedure Laterality Date   BREAST BIOPSY Left 10/2012   Stereo- Malignant   BREAST BIOPSY Left    U/S Core- Benign   BREAST EXCISIONAL BIOPSY Left    BREAST EXCISIONAL BIOPSY Left    BREAST LUMPECTOMY Left 11/2012   radiation tx done x 34 treatments   [place]  2010   laparoscopic   COLONOSCOPY     2013 or 2014   [place]   HYSTEROSCOPY WITH D & C N/A 09/18/2020   Procedure: DILATATION AND CURETTAGE /HYSTEROSCOPY/ POLYPECTOMY;  Surgeon: Janyth Contes, MD;  Location: [facility];  Service: Gynecology;  Laterality: N/A;   MASTECTOMY Left 2018   MASTECTOMY W/ SENTINEL NODE BIOPSY Left 08/18/2016   MASTECTOMY W/ SENTINEL NODE BIOPSY Left 08/18/2016   Procedure: LEFT TOTAL MASTECTOMY WITH SENTINEL LYMPH NODE BIOPSY WITH BLUE DYE INJECTION;  Surgeon: Rolm Bookbinder, MD;  Location: [facility];  Service: General;  Laterality: Left;   SKIN CANCER EXCISION Left 2009   eye brow    Social History   Socioeconomic History   Marital status: Married    Spouse  name: Not on file   Number of children: 2   Years of education: Not on file   Highest education level: Not on file  Occupational History   Occupation: retired  Tobacco Use   Smoking status: Never   Smokeless tobacco: Never  Vaping Use   Vaping Use: Never used  Substance and Sexual Activity   Alcohol use: Yes    Comment: 1 glass wine at new years only   Drug use: No   Sexual activity: Not on file  Other Topics Concern   Not on file  Social History Narrative   Not on file   Social Determinants of Health   Financial Resource Strain: Not on file  Food Insecurity: Not on file  Transportation Needs: Not on file  Physical Activity: Not on file  Stress: Not on file  Social Connections: Not on file  Intimate Partner Violence: Not on file    Family History  Problem Relation Age of Onset   Alzheimer's disease Mother    Heart attack Father    Heart disease Father    Multiple sclerosis Brother    Epilepsy Brother    Alzheimer's disease Brother    Hypertension Brother    Heart disease Brother    Hyperlipidemia Brother    Kidney Stones Brother    Breast cancer Maternal Aunt    Hodgkin's lymphoma Son    Heart disease  Son    Diabetes Son    Breast cancer Cousin    Breast cancer Cousin    Breast cancer Cousin    Heart disease Other    Colon polyps Neg Hx    Colon cancer Neg Hx    Esophageal cancer Neg Hx    Rectal cancer Neg Hx    Stomach cancer Neg Hx     Current Facility-Administered Medications  Medication Dose Route Frequency Provider Last Rate Last Admin   0.9 %  sodium chloride infusion  500 mL Intravenous Once Milus Banister, MD       acetaminophen (TYLENOL) tablet 1,000 mg  1,000 mg Oral TID Michael Boston, MD   1,000 mg at 12/16/20 1659   albuterol (PROVENTIL) (2.5 MG/3ML) 0.083% nebulizer solution 3 mL  3 mL Inhalation Q6H PRN Michael Boston, MD       alum & mag hydroxide-simeth (MAALOX/MYLANTA) 200-200-20 MG/5ML suspension 30 mL  30 mL Oral Q6H PRN Michael Boston, MD       atorvastatin (LIPITOR) tablet 20 mg  20 mg Oral QAC breakfast Michael Boston, MD   20 mg at 12/16/20 5621   cholestyramine light (PREVALITE) packet 4 g  4 g Oral QAC breakfast Michael Boston, MD   4 g at 12/16/20 1048   dicyclomine (BENTYL) capsule 10 mg  10 mg Oral Q0600 Michael Boston, MD   10 mg at 12/16/20 0525   diphenhydrAMINE (BENADRYL) 12.5 MG/5ML elixir 12.5 mg  12.5 mg Oral Q6H PRN Michael Boston, MD       Or   diphenhydrAMINE (BENADRYL) injection 12.5 mg  12.5 mg Intravenous Q6H PRN Michael Boston, MD       diphenoxylate-atropine (LOMOTIL) 2.5-0.025 MG per tablet 1 tablet  1 tablet Oral QID PRN Michael Boston, MD       diphenoxylate-atropine (LOMOTIL) 2.5-0.025 MG per tablet 1 tablet  1 tablet Oral QID Michael Boston, MD   1 tablet at 12/16/20 1700   enalaprilat (VASOTEC) injection 0.625-1.25 mg  0.625-1.25 mg Intravenous Q6H PRN Michael Boston, MD  enoxaparin (LOVENOX) injection 40 mg  40 mg Subcutaneous Q24H Suzzanne Cloud, RPH   40 mg at 12/16/20 4034   feeding supplement (ENSURE SURGERY) liquid 237 mL  237 mL Oral BID BM Michael Boston, MD   237 mL at 12/11/20 1140   ferrous sulfate tablet 325 mg  325 mg Oral  BID WC Michael Boston, MD   325 mg at 12/16/20 1659   fluticasone (FLONASE) 50 MCG/ACT nasal spray 1 spray  1 spray Each Nare Daily Michael Boston, MD   1 spray at 12/16/20 0943   gabapentin (NEURONTIN) capsule 400 mg  400 mg Oral TID Michael Boston, MD   400 mg at 12/16/20 1659   HYDROmorphone (DILAUDID) injection 0.5-2 mg  0.5-2 mg Intravenous Q4H PRN Michael Boston, MD       lip balm (CARMEX) ointment 1 application  1 application Topical BID Michael Boston, MD   1 application at 74/25/95 0944   loratadine (CLARITIN) tablet 10 mg  10 mg Oral Ardeen Fillers, MD   10 mg at 12/15/20 2204   magic mouthwash  15 mL Oral QID PRN Michael Boston, MD       melatonin tablet 3 mg  3 mg Oral QHS PRN Michael Boston, MD       methocarbamol (ROBAXIN) 1,000 mg in dextrose 5 % 100 mL IVPB  1,000 mg Intravenous Q6H PRN Michael Boston, MD       methocarbamol (ROBAXIN) tablet 500 mg  500 mg Oral TID Michael Boston, MD   500 mg at 12/16/20 1659   metoprolol tartrate (LOPRESSOR) injection 5 mg  5 mg Intravenous Q6H PRN Michael Boston, MD       ondansetron [facility]) tablet 4 mg  4 mg Oral Q6H PRN Michael Boston, MD       Or   ondansetron [employer]) injection 4 mg  4 mg Intravenous Q6H PRN Michael Boston, MD       polycarbophil (FIBERCON) tablet 625 mg  625 mg Oral BID Michael Boston, MD   625 mg at 12/16/20 0941   polyvinyl alcohol (LIQUIFILM TEARS) 1.4 % ophthalmic solution 1 drop  1 drop Both Eyes Daily Michael Boston, MD   1 drop at 12/16/20 0944   prochlorperazine (COMPAZINE) tablet 10 mg  10 mg Oral Q6H PRN Michael Boston, MD       Or   prochlorperazine (COMPAZINE) injection 5-10 mg  5-10 mg Intravenous Q6H PRN Michael Boston, MD       simethicone (MYLICON) chewable tablet 40 mg  40 mg Oral Q6H PRN Michael Boston, MD       traMADol Veatrice Bourbon) tablet 100 mg  100 mg Oral Q6H PRN Michael Boston, MD   100 mg at 12/15/20 1352   Current Outpatient Medications  Medication Sig Dispense Refill   albuterol (PROVENTIL HFA;VENTOLIN HFA) 108  (90 BASE) MCG/ACT inhaler Inhale 1 puff into the lungs every 6 (six) hours as needed for wheezing or shortness of breath (Asthma).     atorvastatin (LIPITOR) 20 MG tablet Take 20 mg by mouth daily before breakfast.     cetirizine (ZYRTEC ALLERGY) 10 MG tablet Take 1 tablet (10 mg total) by mouth daily. (Patient taking differently: Take 10 mg by mouth every evening.)     cholestyramine (QUESTRAN) 4 GM/DOSE powder Take 0.75 packets by mouth daily after breakfast.     dicyclomine (BENTYL) 10 MG capsule Take 10 mg by mouth daily at 6 (six) AM. (1st thing in the morning)     fluticasone (  FLONASE) 50 MCG/ACT nasal spray Place 1 spray into both nostrils daily as needed.     lisinopril (ZESTRIL) 20 MG tablet Take 20 mg by mouth at bedtime.     Multiple Vitamins-Minerals (MULTIVITAMIN GUMMIES WOMENS PO) Take 1 tablet by mouth every evening. One-A-Day Women's  pill     Omega-3 Fatty Acids (FISH OIL) 1200 MG CAPS Take 1,200 mg by mouth every evening.     Polyethyl Glycol-Propyl Glycol (SYSTANE) 0.4-0.3 % SOLN Place 1 drop into both eyes daily as needed (dry eyes).     diphenoxylate-atropine (LOMOTIL) 2.5-0.025 MG tablet Take 1 tablet by mouth in the morning and at bedtime. 60 tablet 5   diphenoxylate-atropine (LOMOTIL) 2.5-0.025 MG tablet Take 1 tablet by mouth 4 (four) times daily as needed for diarrhea or loose stools. 60 tablet 10   ferrous sulfate 325 (65 FE) MG tablet Take 1 tablet (325 mg total) by mouth 2 (two) times daily with a meal. 40 tablet 2   gabapentin (NEURONTIN) 300 MG capsule Take 1 capsule (300 mg total) by mouth 3 (three) times daily. 60 capsule 3   traMADol (ULTRAM) 50 MG tablet Take 1-2 tablets (50-100 mg total) by mouth every 6 (six) hours as needed for moderate pain (for pain.). 30 tablet 0     Allergies  Allergen Reactions   Fentanyl Other (See Comments)    Low blood pressure.   Tetracyclines & Related Hives   Other     SEAFOOD > UNSPECIFIED REACTION      Sodium Lactate  Diarrhea   Sucrose     Artificial sugars   Clams [Shellfish Allergy] Diarrhea   Nsaids Nausea And Vomiting    Stomach upset /had hx of stomach ulcers   Pentazocine Lactate Nausea And Vomiting    [place] n/v tunnel vision   Tylenol [Acetaminophen] Other (See Comments)    Worried may cause liver problems - 3g/day max    Signed: Morton Peters, MD, FACS, MASCRS Esophageal, Gastrointestinal & Colorectal Surgery Robotic and Minimally Invasive Surgery  [facility], [place]. [address], [place], [place] [address] [phone] Fax [phone] Main  CONTACT INFORMATION:  Weekday (9AM-5PM): Call [facility] main office at [phone]  Weeknight (5PM-9AM) or Weekend/Holiday: Check [email or web address] (password " [email or web address]") for General Surgery [facility] coverage  (Please, do not use SecureChat as it is not reliable communication to operating surgeons for immediate patient care)      12/16/2020, 7:11 PM

## 2020-12-16 NOTE — Progress Notes (Signed)
Spiritual Care Note  Referred by nurse for emotional support while covering [facility].  Colleen Hancock was welcoming of conversation partner, sharing about how her caregiving for her husband will be significantly more complicated with her own post-op recovery.   Her husband is disabled after a heart attack and leg amputation, and now possibly a COPD diagnosis. Colleen Hancock is hopeful that the [place] or, if needed, private homecare, may be able to help for her second month of recovery, as their son's visit and current balance of respite days from the [place] may cover the first month. Their daughter is helping explore these possibilities.  Provided empathic listening, normalization of feelings, emotional support, and reminder that I am available for outpatient support through [facility] patient and family support at [facility] as she may need/desire.   [place], [place], [facility] M-F daytime pager [phone] [facility] 24/7 pager [phone] Voicemail [phone]

## 2020-12-16 NOTE — Progress Notes (Signed)
Colleen Hancock [id number] 1946-01-27  CARE TEAM:  PCP: Velna Hatchet, MD  Outpatient Care Team: Patient Care Team: Velna Hatchet, MD as PCP - General (Internal Medicine) Michael Boston, MD as Consulting Physician (General Surgery) Milus Banister, MD as Attending Physician (Gastroenterology) Janyth Contes, MD as Consulting Physician (Obstetrics and Gynecology) Nicholas Lose, MD as Consulting Physician (Hematology and Oncology)  Inpatient Treatment Team: Treatment Team: Attending Provider: Michael Boston, MD; Utilization Review: Miringu, Mikael Spray, RN; Occupational Therapist: Marcellina Millin, OT/L; Charge Nurse: Aura Dials, RN; Pharmacist: Randa Spike, [facility]; Physical Therapy Assistant: Diego Cory, PTA; Social Worker: Sherie Don, LCSW   Problem List:   Principal Problem:   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  Firm nodule at cecum near appendiceal orifice correlating with endoscopic and pathology findings.   No obvious metastatic disease on visceral parietal peritoneum or liver.   It is an isoperistaltic ileocolonic anastomosis that rests in the right lower quadrant region.   Assessment  Struggling with diarrhea in the setting of chronic IBS diarrhea  [facility] Stay = 7 days)  Plan:  -CT head negative for stroke.  BP okay & w/o focal deficits.  follow  -Enhanced recovery protocol.  Tolerating  solid diet  -Improved pain control.  Continue Tylenol.  Increase gabapentin again.  Make methocarbamol scheduled.  Patient prefers tramadol for pain.  Nurse to offer 100s and see if that helps.  Again stressed the importance of asking for pain meds.  She just assumed the nurse would give it to her.  IBS regimen - improving.  Continue cholestyramine AM, dicyclomine QAM, FiberCon BID,  Iron BID, & lomotil BID - 5 med regimen working better.  Also lomotil PRN.    Pathology showing no residual cancer & negative lymph nodes.  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  Told patient good news  Blood pressure OK off lisinopril  Probable mild chronic kidney disease.  Cr & UOP good.  Holding ACE inhibitor and follow.  Rechecked electrolytes - OK.    -Medlock IV fluids and follow with backup as needed fluid to avoid fluid overload.  With her diarrhea, risk for dehydration.  However creatinine is stable and blood pressures improved argues against it.  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  Follow  -Anxiolysis as needed  -VTE prophylaxis- SCDs, etc  -mobilize as tolerated to help recovery.  I think that is a challenge.  We will have home health set up.  Patient more amenable to it.  Dr. Orpah Greek agrees and home health.  Disposition: Patient meeting discharge goals.  Patient feeling anxious & overwhelmed about going home.  She is a caregiver for her husband.  Her children are here but she worries that they can only help for the next week.  She is now agreeable to at least home health for Physical & Occupational Therapy.  Discussed  with nursing and nursing charge team.  They will see if spiritual care can help with support, & social work can recheck any other options to support her and her husband.   I worry that the patient's risk of readmission is rather high without a decent support structure between her family and therapy and social work.  It sounds like her children have been  planning to help out.    Another option would be a skilled facility for rehab but she then would need total support for her husband's care or even longer and that worries her.  Hopefully we can find a plan to let her go home safely today with close outpatient follow-up.  Disposition:  The patient is from: Home  Anticipate discharge to:  Home with Home Health  Anticipated Date of Discharge is:  October 13,2022   Barriers to discharge:  Pending Clinical improvement (more likely than not)  Patient currently is NOT MEDICALLY STABLE for discharge from the hospital from a surgery standpoint.      30 minutes spent in review, evaluation, examination, counseling, and coordination of care.   I have reviewed this patient's available data, including medical history, events of note, physical examination and test results as part of my evaluation.  A significant portion of that time was spent in counseling.  Care during the described time interval was provided by me.  I discussion with the patient and her daughter at bedside.  Daughter notes her mother can sometimes struggle to ask for help and suffers resolved.  Patient now more interested in home health.  We will work to arrange that.  12/16/2020    Subjective: (Chief complaint)  Patient with much less diarrhea yesterday.  Stools are a little more formed but not normal.  She seemed disappointed that she is not symptom-free yet.  Anxious about caring for her husband and getting help.  Manuela Schwartz RN in room  Objective:  Vital signs:  Vitals:   12/15/20 1234 12/15/20 2013 12/16/20 0416 12/16/20 0500  BP: (!) 124/52 (!) 129/52 114/83   Pulse: 73 72 76   Resp: 18 17 17    Temp: 97.7 F (36.5 C) 97.9 F (36.6 C) 97.7 F (36.5 C)   TempSrc: Oral     SpO2:  96% 100%   Weight:    93.3 kg  Height:        Last BM Date: 12/16/20  Intake/Output   Yesterday:  10/11 0701 - 10/12 0700 In: 360 [P.O.:360] Out: -  This shift:  Total  I/O In: 240 [P.O.:240] Out: -   Bowel function:  Flatus: YES  BM:  YES  Drain: (No drain)   Physical Exam:  General: Pt awake/alert in no acute distress.  Not toxic nor tired. Eyes: PERRL, normal EOM.  Sclera clear.  No icterus Neuro: CN II-XII intact w/o focal sensory/motor deficits. Lymph: No head/neck/groin lymphadenopathy Psych:  No delerium/psychosis/paranoia.  Oriented x 4.  Anxious and somewhat tearful.  Somewhat consolable. HENT: Normocephalic, Mucus membranes moist.  No thrush Neck: Supple, No tracheal deviation.  No obvious thyromegaly Chest: No pain to chest wall compression.  Good respiratory excursion.  No audible wheezing CV:  Pulses intact.  Regular rhythm.  No major extremity edema MS: Normal AROM mjr joints.  No obvious deformity  Abdomen: Soft.  Nondistended.  Mildly tender at incisions only.  No evidence of peritonitis.  No incarcerated hernias.  Ext:   No deformity.  No mjr edema.  No cyanosis Skin: No petechiae /  purpurea.  No major sores.  Warm and dry    Results:   Cultures: Recent Results (from the past 720 hour(s))  SARS Coronavirus 2 (TAT 6-24 hrs)     Status: None   Collection Time: 12/08/20 12:00 AM  Result Value Ref Range Status   SARS Coronavirus 2 RESULT: NEGATIVE  Final    Comment: RESULT: NEGATIVESARS-CoV-2 INTERPRETATION:A NEGATIVE  test result means that SARS-CoV-2 RNA was not present in the specimen above the limit of detection of this test. This does not preclude a possible SARS-CoV-2 infection and should not be used as the  sole basis for patient management decisions. Negative results must be combined with clinical observations, patient history, and epidemiological information. Optimum specimen types and timing for peak viral levels during infections caused by SARS-CoV-2  have not been determined. Collection of multiple specimens or types of specimens may be necessary to detect virus. Improper specimen collection and handling, sequence  variability under primers/probes, or organism present below the limit of detection may  lead to false negative results. Positive and negative predictive values of testing are highly dependent on prevalence. False negative test results are more likely when prevalence of disease is high.The expected result is NEGATIVE.Fact S heet for  Healthcare Providers: LocalChronicle.no Sheet for Patients: SalonLookup.es Reference Range - Negative   Resp Panel by RT-PCR (Flu A&B, Covid) Nasopharyngeal Swab     Status: None   Collection Time: 12/09/20  7:59 PM   Specimen: Nasopharyngeal Swab; Nasopharyngeal(NP) swabs in vial transport medium  Result Value Ref Range Status   SARS Coronavirus 2 by RT PCR NEGATIVE NEGATIVE Final    Comment: (NOTE) SARS-CoV-2 target nucleic acids are NOT DETECTED.  The SARS-CoV-2 RNA is generally detectable in upper respiratory specimens during the acute phase of infection. The lowest concentration of SARS-CoV-2 viral copies this assay can detect is 138 copies/mL. A negative result does not preclude SARS-Cov-2 infection and should not be used as the sole basis for treatment or other patient management decisions. A negative result may occur with  improper specimen collection/handling, submission of specimen other than nasopharyngeal swab, presence of viral mutation(s) within the areas targeted by this assay, and inadequate number of viral copies(<138 copies/mL). A negative result must be combined with clinical observations, patient history, and epidemiological information. The expected result is Negative.  Fact Sheet for Patients:  EntrepreneurPulse.com.au  Fact Sheet for Healthcare Providers:  IncredibleEmployment.be  This test is no t yet approved or cleared by the [place] FDA and  has been authorized for detection and/or diagnosis of SARS-CoV-2 by FDA under an  Emergency Use Authorization (EUA). This EUA will remain  in effect (meaning this test can be used) for the duration of the COVID-19 declaration under Section 564(b)(1) of the Act, 21 U.S.C.section 360bbb-3(b)(1), unless the authorization is terminated  or revoked sooner.       Influenza A by PCR NEGATIVE NEGATIVE Final   Influenza B by PCR NEGATIVE NEGATIVE Final    Comment: (NOTE) The Xpert Xpress SARS-CoV-2/FLU/RSV plus assay is intended as an aid in the diagnosis of influenza from Nasopharyngeal swab specimens and should not be used as a sole basis for treatment. Nasal washings and aspirates are unacceptable for Xpert Xpress SARS-CoV-2/FLU/RSV testing.  Fact Sheet for Patients: EntrepreneurPulse.com.au  Fact Sheet for Healthcare Providers: IncredibleEmployment.be  This test is not yet approved or cleared by the [place] FDA and has been authorized for detection and/or diagnosis of SARS-CoV-2 by FDA under an Emergency Use Authorization (EUA).  This EUA will remain in effect (meaning this test can be used) for the duration of the COVID-19 declaration under Section 564(b)(1) of the Act, 21 U.S.C. section 360bbb-3(b)(1), unless the authorization is terminated or revoked.  Performed at [facility], [place] [address], [place], [place] [address]   Surgical PCR screen     Status: None   Collection Time: 12/10/20  3:42 AM   Specimen: Nasal Mucosa; Nasal Swab  Result Value Ref Range Status   MRSA, PCR NEGATIVE NEGATIVE Final   Staphylococcus aureus NEGATIVE NEGATIVE Final    Comment: (NOTE) The Xpert SA Assay (FDA approved for NASAL specimens in patients 78 years of age and older), is one component of a comprehensive surveillance program. It is not intended to diagnose infection nor to guide or monitor treatment. Performed at [facility], [place] [address] [place], [place] [address]     Labs: No  results found for this or any previous visit (from the past 48 hour(s)).    Imaging / Studies: No results found.  Medications / Allergies: per chart  Antibiotics: Anti-infectives (From admission, onward)    Start     Dose/Rate Route Frequency Ordered Stop   12/10/20 2000  cefoTEtan (CEFOTAN) 2 g in sodium chloride 0.9 % 100 mL IVPB        2 g 200 mL/hr over 30 Minutes Intravenous Every 12 hours 12/10/20 1202 12/10/20 2200   12/10/20 1400  neomycin (MYCIFRADIN) tablet 1,000 mg  Status:  Discontinued       See Hyperspace for full Linked Orders Report.   1,000 mg Oral 3 times per day 12/10/20 0628 12/10/20 0638   12/10/20 1400  metroNIDAZOLE (FLAGYL) tablet 1,000 mg  Status:  Discontinued       See Hyperspace for full Linked Orders Report.   1,000 mg Oral 3 times per day 12/10/20 0628 12/10/20 0638   12/10/20 0715  cefoTEtan (CEFOTAN) 2 g in sodium chloride 0.9 % 100 mL IVPB        2 g 200 mL/hr over 30 Minutes Intravenous On call to O.R. 12/10/20 0628 12/10/20 0807   12/09/20 2200  neomycin (MYCIFRADIN) tablet 1,000 mg        1,000 mg Oral  Once 12/09/20 2047 12/10/20 0016   12/09/20 2200  metroNIDAZOLE (FLAGYL) tablet 500 mg        500 mg Oral  Once 12/09/20 2047 12/10/20 0015         Note: Portions of this report may have been transcribed using voice recognition software. Every effort was made to ensure accuracy; however, inadvertent computerized transcription errors may be present.   Any transcriptional errors that result from this process are unintentional.    Colleen Hector, MD, FACS, MASCRS Esophageal, Gastrointestinal & Colorectal Surgery Robotic and Minimally Invasive Surgery  [facility], [place]  [place]. [address] [place], [place] [address] [phone] Fax [phone] Main  CONTACT INFORMATION:  Weekday (9AM-5PM): Call [facility] main office at [phone]  Weeknight (5PM-9AM) or  Weekend/Holiday: Check [email or web address] (password " [email or web address]") for General Surgery [facility] coverage  (Please, do not use SecureChat as it is not reliable communication to operating surgeons for immediate patient care)      12/16/2020  11:08 AM

## 2020-12-16 NOTE — Progress Notes (Signed)
Patient and daughter were given discharge instructions, and all questions were answered. Patient was stable for discharge and was taken to the main exit by wheelchair.

## 2020-12-17 DIAGNOSIS — Z7951 Long term (current) use of inhaled steroids: Secondary | ICD-10-CM | POA: Diagnosis not present

## 2020-12-17 DIAGNOSIS — E7800 Pure hypercholesterolemia, unspecified: Secondary | ICD-10-CM | POA: Diagnosis not present

## 2020-12-17 DIAGNOSIS — K5792 Diverticulitis of intestine, part unspecified, without perforation or abscess without bleeding: Secondary | ICD-10-CM | POA: Diagnosis not present

## 2020-12-17 DIAGNOSIS — K760 Fatty (change of) liver, not elsewhere classified: Secondary | ICD-10-CM | POA: Diagnosis not present

## 2020-12-17 DIAGNOSIS — Z9049 Acquired absence of other specified parts of digestive tract: Secondary | ICD-10-CM | POA: Diagnosis not present

## 2020-12-17 DIAGNOSIS — Z853 Personal history of malignant neoplasm of breast: Secondary | ICD-10-CM | POA: Diagnosis not present

## 2020-12-17 DIAGNOSIS — F419 Anxiety disorder, unspecified: Secondary | ICD-10-CM | POA: Diagnosis not present

## 2020-12-17 DIAGNOSIS — Z9181 History of falling: Secondary | ICD-10-CM | POA: Diagnosis not present

## 2020-12-17 DIAGNOSIS — I129 Hypertensive chronic kidney disease with stage 1 through stage 4 chronic kidney disease, or unspecified chronic kidney disease: Secondary | ICD-10-CM | POA: Diagnosis not present

## 2020-12-17 DIAGNOSIS — K580 Irritable bowel syndrome with diarrhea: Secondary | ICD-10-CM | POA: Diagnosis not present

## 2020-12-17 DIAGNOSIS — Z8711 Personal history of peptic ulcer disease: Secondary | ICD-10-CM | POA: Diagnosis not present

## 2020-12-17 DIAGNOSIS — Z79891 Long term (current) use of opiate analgesic: Secondary | ICD-10-CM | POA: Diagnosis not present

## 2020-12-17 DIAGNOSIS — M150 Primary generalized (osteo)arthritis: Secondary | ICD-10-CM | POA: Diagnosis not present

## 2020-12-17 DIAGNOSIS — Z85068 Personal history of other malignant neoplasm of small intestine: Secondary | ICD-10-CM | POA: Diagnosis not present

## 2020-12-17 DIAGNOSIS — N182 Chronic kidney disease, stage 2 (mild): Secondary | ICD-10-CM | POA: Diagnosis not present

## 2020-12-17 DIAGNOSIS — J45909 Unspecified asthma, uncomplicated: Secondary | ICD-10-CM | POA: Diagnosis not present

## 2020-12-17 DIAGNOSIS — K219 Gastro-esophageal reflux disease without esophagitis: Secondary | ICD-10-CM | POA: Diagnosis not present

## 2020-12-17 DIAGNOSIS — Z483 Aftercare following surgery for neoplasm: Secondary | ICD-10-CM | POA: Diagnosis not present

## 2020-12-21 DIAGNOSIS — K219 Gastro-esophageal reflux disease without esophagitis: Secondary | ICD-10-CM | POA: Diagnosis not present

## 2020-12-21 DIAGNOSIS — K760 Fatty (change of) liver, not elsewhere classified: Secondary | ICD-10-CM | POA: Diagnosis not present

## 2020-12-21 DIAGNOSIS — Z483 Aftercare following surgery for neoplasm: Secondary | ICD-10-CM | POA: Diagnosis not present

## 2020-12-21 DIAGNOSIS — I129 Hypertensive chronic kidney disease with stage 1 through stage 4 chronic kidney disease, or unspecified chronic kidney disease: Secondary | ICD-10-CM | POA: Diagnosis not present

## 2020-12-21 DIAGNOSIS — K5792 Diverticulitis of intestine, part unspecified, without perforation or abscess without bleeding: Secondary | ICD-10-CM | POA: Diagnosis not present

## 2020-12-21 DIAGNOSIS — K580 Irritable bowel syndrome with diarrhea: Secondary | ICD-10-CM | POA: Diagnosis not present

## 2020-12-23 DIAGNOSIS — I129 Hypertensive chronic kidney disease with stage 1 through stage 4 chronic kidney disease, or unspecified chronic kidney disease: Secondary | ICD-10-CM | POA: Diagnosis not present

## 2020-12-23 DIAGNOSIS — K760 Fatty (change of) liver, not elsewhere classified: Secondary | ICD-10-CM | POA: Diagnosis not present

## 2020-12-23 DIAGNOSIS — K219 Gastro-esophageal reflux disease without esophagitis: Secondary | ICD-10-CM | POA: Diagnosis not present

## 2020-12-23 DIAGNOSIS — Z483 Aftercare following surgery for neoplasm: Secondary | ICD-10-CM | POA: Diagnosis not present

## 2020-12-23 DIAGNOSIS — K5792 Diverticulitis of intestine, part unspecified, without perforation or abscess without bleeding: Secondary | ICD-10-CM | POA: Diagnosis not present

## 2020-12-23 DIAGNOSIS — K580 Irritable bowel syndrome with diarrhea: Secondary | ICD-10-CM | POA: Diagnosis not present

## 2020-12-25 DIAGNOSIS — Z483 Aftercare following surgery for neoplasm: Secondary | ICD-10-CM | POA: Diagnosis not present

## 2020-12-25 DIAGNOSIS — K5792 Diverticulitis of intestine, part unspecified, without perforation or abscess without bleeding: Secondary | ICD-10-CM | POA: Diagnosis not present

## 2020-12-25 DIAGNOSIS — K580 Irritable bowel syndrome with diarrhea: Secondary | ICD-10-CM | POA: Diagnosis not present

## 2020-12-25 DIAGNOSIS — K760 Fatty (change of) liver, not elsewhere classified: Secondary | ICD-10-CM | POA: Diagnosis not present

## 2020-12-25 DIAGNOSIS — I129 Hypertensive chronic kidney disease with stage 1 through stage 4 chronic kidney disease, or unspecified chronic kidney disease: Secondary | ICD-10-CM | POA: Diagnosis not present

## 2020-12-25 DIAGNOSIS — K219 Gastro-esophageal reflux disease without esophagitis: Secondary | ICD-10-CM | POA: Diagnosis not present

## 2020-12-28 DIAGNOSIS — K219 Gastro-esophageal reflux disease without esophagitis: Secondary | ICD-10-CM | POA: Diagnosis not present

## 2020-12-28 DIAGNOSIS — K580 Irritable bowel syndrome with diarrhea: Secondary | ICD-10-CM | POA: Diagnosis not present

## 2020-12-28 DIAGNOSIS — Z483 Aftercare following surgery for neoplasm: Secondary | ICD-10-CM | POA: Diagnosis not present

## 2020-12-28 DIAGNOSIS — K5792 Diverticulitis of intestine, part unspecified, without perforation or abscess without bleeding: Secondary | ICD-10-CM | POA: Diagnosis not present

## 2020-12-28 DIAGNOSIS — I129 Hypertensive chronic kidney disease with stage 1 through stage 4 chronic kidney disease, or unspecified chronic kidney disease: Secondary | ICD-10-CM | POA: Diagnosis not present

## 2020-12-28 DIAGNOSIS — K760 Fatty (change of) liver, not elsewhere classified: Secondary | ICD-10-CM | POA: Diagnosis not present

## 2020-12-30 DIAGNOSIS — K580 Irritable bowel syndrome with diarrhea: Secondary | ICD-10-CM | POA: Diagnosis not present

## 2020-12-30 DIAGNOSIS — K5792 Diverticulitis of intestine, part unspecified, without perforation or abscess without bleeding: Secondary | ICD-10-CM | POA: Diagnosis not present

## 2020-12-30 DIAGNOSIS — I129 Hypertensive chronic kidney disease with stage 1 through stage 4 chronic kidney disease, or unspecified chronic kidney disease: Secondary | ICD-10-CM | POA: Diagnosis not present

## 2020-12-30 DIAGNOSIS — K219 Gastro-esophageal reflux disease without esophagitis: Secondary | ICD-10-CM | POA: Diagnosis not present

## 2020-12-30 DIAGNOSIS — K760 Fatty (change of) liver, not elsewhere classified: Secondary | ICD-10-CM | POA: Diagnosis not present

## 2020-12-30 DIAGNOSIS — Z483 Aftercare following surgery for neoplasm: Secondary | ICD-10-CM | POA: Diagnosis not present

## 2021-01-01 DIAGNOSIS — K580 Irritable bowel syndrome with diarrhea: Secondary | ICD-10-CM | POA: Diagnosis not present

## 2021-01-01 DIAGNOSIS — K5792 Diverticulitis of intestine, part unspecified, without perforation or abscess without bleeding: Secondary | ICD-10-CM | POA: Diagnosis not present

## 2021-01-01 DIAGNOSIS — I129 Hypertensive chronic kidney disease with stage 1 through stage 4 chronic kidney disease, or unspecified chronic kidney disease: Secondary | ICD-10-CM | POA: Diagnosis not present

## 2021-01-01 DIAGNOSIS — K219 Gastro-esophageal reflux disease without esophagitis: Secondary | ICD-10-CM | POA: Diagnosis not present

## 2021-01-01 DIAGNOSIS — Z483 Aftercare following surgery for neoplasm: Secondary | ICD-10-CM | POA: Diagnosis not present

## 2021-01-01 DIAGNOSIS — K760 Fatty (change of) liver, not elsewhere classified: Secondary | ICD-10-CM | POA: Diagnosis not present

## 2021-01-04 DIAGNOSIS — K760 Fatty (change of) liver, not elsewhere classified: Secondary | ICD-10-CM | POA: Diagnosis not present

## 2021-01-04 DIAGNOSIS — K5792 Diverticulitis of intestine, part unspecified, without perforation or abscess without bleeding: Secondary | ICD-10-CM | POA: Diagnosis not present

## 2021-01-04 DIAGNOSIS — K219 Gastro-esophageal reflux disease without esophagitis: Secondary | ICD-10-CM | POA: Diagnosis not present

## 2021-01-04 DIAGNOSIS — Z483 Aftercare following surgery for neoplasm: Secondary | ICD-10-CM | POA: Diagnosis not present

## 2021-01-04 DIAGNOSIS — K580 Irritable bowel syndrome with diarrhea: Secondary | ICD-10-CM | POA: Diagnosis not present

## 2021-01-04 DIAGNOSIS — I129 Hypertensive chronic kidney disease with stage 1 through stage 4 chronic kidney disease, or unspecified chronic kidney disease: Secondary | ICD-10-CM | POA: Diagnosis not present

## 2021-01-06 DIAGNOSIS — Z483 Aftercare following surgery for neoplasm: Secondary | ICD-10-CM | POA: Diagnosis not present

## 2021-01-06 DIAGNOSIS — I129 Hypertensive chronic kidney disease with stage 1 through stage 4 chronic kidney disease, or unspecified chronic kidney disease: Secondary | ICD-10-CM | POA: Diagnosis not present

## 2021-01-06 DIAGNOSIS — K580 Irritable bowel syndrome with diarrhea: Secondary | ICD-10-CM | POA: Diagnosis not present

## 2021-01-06 DIAGNOSIS — K219 Gastro-esophageal reflux disease without esophagitis: Secondary | ICD-10-CM | POA: Diagnosis not present

## 2021-01-06 DIAGNOSIS — K5792 Diverticulitis of intestine, part unspecified, without perforation or abscess without bleeding: Secondary | ICD-10-CM | POA: Diagnosis not present

## 2021-01-06 DIAGNOSIS — K760 Fatty (change of) liver, not elsewhere classified: Secondary | ICD-10-CM | POA: Diagnosis not present

## 2021-01-11 DIAGNOSIS — K5792 Diverticulitis of intestine, part unspecified, without perforation or abscess without bleeding: Secondary | ICD-10-CM | POA: Diagnosis not present

## 2021-01-11 DIAGNOSIS — I129 Hypertensive chronic kidney disease with stage 1 through stage 4 chronic kidney disease, or unspecified chronic kidney disease: Secondary | ICD-10-CM | POA: Diagnosis not present

## 2021-01-11 DIAGNOSIS — K580 Irritable bowel syndrome with diarrhea: Secondary | ICD-10-CM | POA: Diagnosis not present

## 2021-01-11 DIAGNOSIS — K760 Fatty (change of) liver, not elsewhere classified: Secondary | ICD-10-CM | POA: Diagnosis not present

## 2021-01-11 DIAGNOSIS — K219 Gastro-esophageal reflux disease without esophagitis: Secondary | ICD-10-CM | POA: Diagnosis not present

## 2021-01-11 DIAGNOSIS — Z483 Aftercare following surgery for neoplasm: Secondary | ICD-10-CM | POA: Diagnosis not present

## 2021-01-15 DIAGNOSIS — I129 Hypertensive chronic kidney disease with stage 1 through stage 4 chronic kidney disease, or unspecified chronic kidney disease: Secondary | ICD-10-CM | POA: Diagnosis not present

## 2021-01-15 DIAGNOSIS — K5792 Diverticulitis of intestine, part unspecified, without perforation or abscess without bleeding: Secondary | ICD-10-CM | POA: Diagnosis not present

## 2021-01-15 DIAGNOSIS — K219 Gastro-esophageal reflux disease without esophagitis: Secondary | ICD-10-CM | POA: Diagnosis not present

## 2021-01-15 DIAGNOSIS — Z483 Aftercare following surgery for neoplasm: Secondary | ICD-10-CM | POA: Diagnosis not present

## 2021-01-15 DIAGNOSIS — K580 Irritable bowel syndrome with diarrhea: Secondary | ICD-10-CM | POA: Diagnosis not present

## 2021-01-15 DIAGNOSIS — K760 Fatty (change of) liver, not elsewhere classified: Secondary | ICD-10-CM | POA: Diagnosis not present

## 2021-01-16 DIAGNOSIS — K760 Fatty (change of) liver, not elsewhere classified: Secondary | ICD-10-CM | POA: Diagnosis not present

## 2021-01-16 DIAGNOSIS — Z7951 Long term (current) use of inhaled steroids: Secondary | ICD-10-CM | POA: Diagnosis not present

## 2021-01-16 DIAGNOSIS — Z483 Aftercare following surgery for neoplasm: Secondary | ICD-10-CM | POA: Diagnosis not present

## 2021-01-16 DIAGNOSIS — Z9049 Acquired absence of other specified parts of digestive tract: Secondary | ICD-10-CM | POA: Diagnosis not present

## 2021-01-16 DIAGNOSIS — Z85068 Personal history of other malignant neoplasm of small intestine: Secondary | ICD-10-CM | POA: Diagnosis not present

## 2021-01-16 DIAGNOSIS — K5792 Diverticulitis of intestine, part unspecified, without perforation or abscess without bleeding: Secondary | ICD-10-CM | POA: Diagnosis not present

## 2021-01-16 DIAGNOSIS — M150 Primary generalized (osteo)arthritis: Secondary | ICD-10-CM | POA: Diagnosis not present

## 2021-01-16 DIAGNOSIS — F419 Anxiety disorder, unspecified: Secondary | ICD-10-CM | POA: Diagnosis not present

## 2021-01-16 DIAGNOSIS — E7800 Pure hypercholesterolemia, unspecified: Secondary | ICD-10-CM | POA: Diagnosis not present

## 2021-01-16 DIAGNOSIS — Z853 Personal history of malignant neoplasm of breast: Secondary | ICD-10-CM | POA: Diagnosis not present

## 2021-01-16 DIAGNOSIS — N182 Chronic kidney disease, stage 2 (mild): Secondary | ICD-10-CM | POA: Diagnosis not present

## 2021-01-16 DIAGNOSIS — Z79891 Long term (current) use of opiate analgesic: Secondary | ICD-10-CM | POA: Diagnosis not present

## 2021-01-16 DIAGNOSIS — I129 Hypertensive chronic kidney disease with stage 1 through stage 4 chronic kidney disease, or unspecified chronic kidney disease: Secondary | ICD-10-CM | POA: Diagnosis not present

## 2021-01-16 DIAGNOSIS — K580 Irritable bowel syndrome with diarrhea: Secondary | ICD-10-CM | POA: Diagnosis not present

## 2021-01-16 DIAGNOSIS — K219 Gastro-esophageal reflux disease without esophagitis: Secondary | ICD-10-CM | POA: Diagnosis not present

## 2021-01-16 DIAGNOSIS — Z8711 Personal history of peptic ulcer disease: Secondary | ICD-10-CM | POA: Diagnosis not present

## 2021-01-16 DIAGNOSIS — J45909 Unspecified asthma, uncomplicated: Secondary | ICD-10-CM | POA: Diagnosis not present

## 2021-01-16 DIAGNOSIS — Z9181 History of falling: Secondary | ICD-10-CM | POA: Diagnosis not present

## 2021-01-18 DIAGNOSIS — K760 Fatty (change of) liver, not elsewhere classified: Secondary | ICD-10-CM | POA: Diagnosis not present

## 2021-01-18 DIAGNOSIS — K580 Irritable bowel syndrome with diarrhea: Secondary | ICD-10-CM | POA: Diagnosis not present

## 2021-01-18 DIAGNOSIS — K219 Gastro-esophageal reflux disease without esophagitis: Secondary | ICD-10-CM | POA: Diagnosis not present

## 2021-01-18 DIAGNOSIS — I129 Hypertensive chronic kidney disease with stage 1 through stage 4 chronic kidney disease, or unspecified chronic kidney disease: Secondary | ICD-10-CM | POA: Diagnosis not present

## 2021-01-18 DIAGNOSIS — K5792 Diverticulitis of intestine, part unspecified, without perforation or abscess without bleeding: Secondary | ICD-10-CM | POA: Diagnosis not present

## 2021-01-18 DIAGNOSIS — Z483 Aftercare following surgery for neoplasm: Secondary | ICD-10-CM | POA: Diagnosis not present

## 2021-01-19 DIAGNOSIS — K580 Irritable bowel syndrome with diarrhea: Secondary | ICD-10-CM | POA: Diagnosis not present

## 2021-01-19 DIAGNOSIS — I129 Hypertensive chronic kidney disease with stage 1 through stage 4 chronic kidney disease, or unspecified chronic kidney disease: Secondary | ICD-10-CM | POA: Diagnosis not present

## 2021-01-19 DIAGNOSIS — K760 Fatty (change of) liver, not elsewhere classified: Secondary | ICD-10-CM | POA: Diagnosis not present

## 2021-01-19 DIAGNOSIS — K5792 Diverticulitis of intestine, part unspecified, without perforation or abscess without bleeding: Secondary | ICD-10-CM | POA: Diagnosis not present

## 2021-01-19 DIAGNOSIS — K219 Gastro-esophageal reflux disease without esophagitis: Secondary | ICD-10-CM | POA: Diagnosis not present

## 2021-01-19 DIAGNOSIS — Z483 Aftercare following surgery for neoplasm: Secondary | ICD-10-CM | POA: Diagnosis not present

## 2021-01-20 ENCOUNTER — Encounter: Payer: Self-pay | Admitting: Nurse Practitioner

## 2021-01-20 ENCOUNTER — Ambulatory Visit (INDEPENDENT_AMBULATORY_CARE_PROVIDER_SITE_OTHER): Payer: Medicare Other | Admitting: Nurse Practitioner

## 2021-01-20 ENCOUNTER — Other Ambulatory Visit (INDEPENDENT_AMBULATORY_CARE_PROVIDER_SITE_OTHER): Payer: Medicare Other

## 2021-01-20 VITALS — BP 114/68 | HR 78 | Ht 63.75 in | Wt 189.0 lb

## 2021-01-20 DIAGNOSIS — C189 Malignant neoplasm of colon, unspecified: Secondary | ICD-10-CM

## 2021-01-20 LAB — CBC WITH DIFFERENTIAL/PLATELET
Basophils Absolute: 0 10*3/uL (ref 0.0–0.1)
Basophils Relative: 0.5 % (ref 0.0–3.0)
Eosinophils Absolute: 0 10*3/uL (ref 0.0–0.7)
Eosinophils Relative: 0.8 % (ref 0.0–5.0)
HCT: 37.5 % (ref 36.0–46.0)
Hemoglobin: 12.7 g/dL (ref 12.0–15.0)
Lymphocytes Relative: 24.3 % (ref 12.0–46.0)
Lymphs Abs: 1.5 10*3/uL (ref 0.7–4.0)
MCHC: 33.8 g/dL (ref 30.0–36.0)
MCV: 96.5 fl (ref 78.0–100.0)
Monocytes Absolute: 0.5 10*3/uL (ref 0.1–1.0)
Monocytes Relative: 8.5 % (ref 3.0–12.0)
Neutro Abs: 4 10*3/uL (ref 1.4–7.7)
Neutrophils Relative %: 65.9 % (ref 43.0–77.0)
Platelets: 196 10*3/uL (ref 150.0–400.0)
RBC: 3.88 Mil/uL (ref 3.87–5.11)
RDW: 13.6 % (ref 11.5–15.5)
WBC: 6.1 10*3/uL (ref 4.0–10.5)

## 2021-01-20 LAB — COMPREHENSIVE METABOLIC PANEL
ALT: 26 U/L (ref 0–35)
AST: 24 U/L (ref 0–37)
Albumin: 4.3 g/dL (ref 3.5–5.2)
Alkaline Phosphatase: 72 U/L (ref 39–117)
BUN: 9 mg/dL (ref 6–23)
CO2: 27 mEq/L (ref 19–32)
Calcium: 9.5 mg/dL (ref 8.4–10.5)
Chloride: 104 mEq/L (ref 96–112)
Creatinine, Ser: 0.91 mg/dL (ref 0.40–1.20)
GFR: 61.54 mL/min (ref >60.00)
Glucose, Bld: 100 mg/dL — ABNORMAL HIGH (ref 70–99)
Potassium: 4 mEq/L (ref 3.5–5.1)
Sodium: 141 mEq/L (ref 135–145)
Total Bilirubin: 0.5 mg/dL (ref 0.2–1.2)
Total Protein: 7.2 g/dL (ref 6.0–8.3)

## 2021-01-20 NOTE — Progress Notes (Signed)
01/20/2021 Colleen Hancock [id number] September 20, 1945   Chief Complaint: Change in bowel pattern   History of Present Illness:  Colleen Hancock. Colleen Hancock is a 75 year old female with a past medical history of anxiety, hypertension, CKD stage II, GERD, IBS and hepatic steatosis.  She was initially seen by Dr. Ardis Hughs 09/23/2020 due to having heme positive stool.  A colonoscopy was completed 10/21/2020 which identified a 10 mm polyp at the appendiceal orifice and diverticulosis to the left colon.  The pathology report confirmed invasive adenocarcinoma. A chest/abdominal/pelvic CT with contrast identified an indeterminate 5 mm nodule and a 2 mm nodule within the right lower lobe otherwise no CT evidence for metastasis within the chest, abdomen or pelvis.  She underwent a robotic proximal colectomy and omentopexy by Dr. Alwyn Pea on 12/10/2020.  Her surgical path report showed evidence of sessile serrated polyp with low-grade dysplasia, resection margins were negative for dysplasia,12 benign lymph nodes were identified and the appendix was benign.  No postoperative complications and she was discharged home 12/16/2020.  She was seen by Dr. Johney Maine in the outpatient clinic 12/29/2020.  At that time she was having some bowel irregularity and she was advised to take a fiber supplement and to eat a low-fat high-fiber diet as tolerated.  She presents to our office today for further evaluation regarding altered bowel pattern since her colon resection.  She is accompanied by her daughter.  She reports passing 2-3 soft brown stools which followed part in the toilet water for 1 or 2 days followed by 2 to 3 episodes of nonbloody diarrhea the day next day then the cycle repeats.  She is taking Lomotil 1 tab twice daily which has significantly controlled her diarrhea frequency.  She initially tried Citrucel fiber supplement which resulted in increased abdominal gas so she stopped taking it.  She is now taking Metamucil 1  tablespoon daily.  She eats yogurt a few days weekly and infrequently eats ice cream.  No known lactose intolerance.  She remains on Dicyclomine 10 mg once daily and Cholestyramine 4 g once daily which she has taken for many years for her IBS symptoms.  No rectal bleeding.  She has intermittent right or left-sided abdominal pain for which she takes gabapentin as needed.  She is no longer taking tramadol.  No severe abdominal pain.  No fevers.  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 No other complaints at this time.  CBC Latest Ref Rng & Units 12/14/2020 12/11/2020 12/09/2020  WBC 4.0 - 10.5 K/uL 5.1 9.8 9.1  Hemoglobin 12.0 - 15.0 g/dL 12.6 11.9(L) 13.4  Hematocrit 36.0 - 46.0 % 36.6 34.7(L) 40.2  Platelets 150 - 400 K/uL 147(L) 131(L) 168     CMP Latest Ref Rng & Units 12/14/2020 12/11/2020 12/09/2020  Glucose 70 - 99 mg/dL 145(H) 139(H) -  BUN 8 - 23 mg/dL 24(H) 12 -  Creatinine 0.44 - 1.00 mg/dL 0.96 0.92 1.22(H)  Sodium 135 - 145 mmol/L 131(L) 137 -  Potassium 3.5 - 5.1 mmol/L 4.9 4.7 -  Chloride 98 - 111 mmol/L 103 106 -  CO2 22 - 32 mmol/L 21(L) 24 -  Calcium 8.9 - 10.3 mg/dL 9.3 8.9 -  Total Protein 6.5 - 8.1 g/dL - - -  Total Bilirubin 0.3 - 1.2 mg/dL - - -  Alkaline Phos 38 - 126 U/L - - -  AST 15 -  41 U/L - - -  ALT 0 - 44 U/L - - -    Colonoscopy 10/21/2020 by Dr. Ardis Hughs:  - One 10 mm polyp at the appendiceal orifice, removed with a hot snare. Resected and retrieved. - Diverticulosis in the left colon. - The examination was otherwise normal on direct and retroflexion views. INVASIVE ADENOCARCINOMA INVOLVING FRAGMENTS OF TUBULAR ADENOMA WITH HIGH GRADE DYSPLASIA. - SEE MICROSCOPIC DESCRIPTION.  S/P colon right resection [id number] A. COLON, PROXIMAL RIGHT, RESECTION:  - Sessile serrated polyp with low-grade cytologic dysplasia, 1.4 cm  - Negative for high-grade dysplasia or  carcinoma  - Resection margins are negative for dysplasia  - Twelve benign lymph nodes (0/12)  - Benign unremarkable appendix   Chest/abdominal/pelvic CT with contrast 11/05/2020: There is an indeterminate 5 mm nodule and 2 mm nodule within the right lower lobe.  Otherwise no CT evidence for metastasis within the chest, abdomen or pelvis.   Current Outpatient Medications on File Prior to Visit  Medication Sig Dispense Refill   albuterol (PROVENTIL HFA;VENTOLIN HFA) 108 (90 BASE) MCG/ACT inhaler Inhale 1 puff into the lungs every 6 (six) hours as needed for wheezing or shortness of breath (Asthma).     atorvastatin (LIPITOR) 20 MG tablet Take 20 mg by mouth daily before breakfast.     cetirizine (ZYRTEC ALLERGY) 10 MG tablet Take 1 tablet (10 mg total) by mouth daily. (Patient taking differently: Take 10 mg by mouth every evening.)     cholestyramine (QUESTRAN) 4 GM/DOSE powder Take 0.75 packets by mouth daily after breakfast.     dicyclomine (BENTYL) 10 MG capsule Take 10 mg by mouth daily at 6 (six) AM. (1st thing in the morning)     diphenoxylate-atropine (LOMOTIL) 2.5-0.025 MG tablet Take 1 tablet by mouth in the morning and at bedtime. 60 tablet 5   diphenoxylate-atropine (LOMOTIL) 2.5-0.025 MG tablet Take 1 tablet by mouth 4 (four) times daily as needed for diarrhea or loose stools. 60 tablet 10   ferrous sulfate 325 (65 FE) MG tablet Take 1 tablet (325 mg total) by mouth 2 (two) times daily with a meal. 40 tablet 2   fluticasone (FLONASE) 50 MCG/ACT nasal spray Place 1 spray into both nostrils daily as needed.     gabapentin (NEURONTIN) 300 MG capsule Take 1 capsule (300 mg total) by mouth 3 (three) times daily. 60 capsule 3   lisinopril (ZESTRIL) 20 MG tablet Take 20 mg by mouth at bedtime.     Multiple Vitamins-Minerals (MULTIVITAMIN GUMMIES WOMENS PO) Take 1 tablet by mouth every evening. One-A-Day Women's  pill     Omega-3 Fatty Acids (FISH OIL) 1200 MG CAPS Take 1,200 mg by mouth  every evening.     Polyethyl Glycol-Propyl Glycol (SYSTANE) 0.4-0.3 % SOLN Place 1 drop into both eyes daily as needed (dry eyes).     traMADol (ULTRAM) 50 MG tablet Take 1-2 tablets (50-100 mg total) by mouth every 6 (six) hours as needed for moderate pain (for pain.). 30 tablet 0   Current Facility-Administered Medications on File Prior to Visit  Medication Dose Route Frequency Provider Last Rate Last Admin   0.9 %  sodium chloride infusion  500 mL Intravenous Once Milus Banister, MD       Allergies  Allergen Reactions   Fentanyl Other (See Comments)    Low blood pressure.   Tetracyclines & Related Hives   Other     SEAFOOD > UNSPECIFIED REACTION      Sodium Lactate Diarrhea  Sucrose     Artificial sugars   Clams [Shellfish Allergy] Diarrhea   Nsaids Nausea And Vomiting    Stomach upset /had hx of stomach ulcers   Pentazocine Lactate Nausea And Vomiting    [place] n/v tunnel vision   Tylenol [Acetaminophen] Other (See Comments)    Worried may cause liver problems - 3g/day max   Current Medications, Allergies, Past Medical History, Past Surgical History, Family History and Social History were reviewed in [employer] record.  Review of Systems:   Constitutional: Negative for fever, sweats, chills or weight loss.  Respiratory: Negative for shortness of breath.   Cardiovascular: Negative for chest pain, palpitations and leg swelling.  Gastrointestinal: See HPI.  Musculoskeletal: Negative for back pain or muscle aches.  Neurological: Negative for dizziness, headaches or paresthesias.   Physical Exam: BP 114/68   Pulse 78   Ht 5' 3.75" (1.619 m)   Wt 189 lb (85.7 kg)   SpO2 97%   BMI 32.70 kg/m  General: 75 year old female in no acute distress. Head: Normocephalic and atraumatic. Eyes: No scleral icterus. Conjunctiva pink . Ears: Normal auditory acuity. Mouth: Dentition intact. No ulcers or lesions.  Lungs: Clear throughout to  auscultation. Heart: Regular rate and rhythm, no murmur. Abdomen: Soft obese abdomen nontender and nondistended. No masses or hepatomegaly. Normal bowel sounds x 4 quadrants. Laparoscopic scars intact.  Rectal: deferred.  Musculoskeletal: Symmetrical with no gross deformities. Extremities: No edema. Neurological: Alert oriented x 4. No focal deficits.  Psychological: Alert and cooperative. 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  Intermittent brief right and left mid abdominal pain. -Continue Metamucil daily, if not tolerated try Benefiber 1 tablespoon daily -Florastor probiotic 1 p.o. twice daily if not affordable then take a bacteria probiotic of choice once daily -Lactaid with each dairy product or avoid dairy product -Okay to continue Lomotil 1 p.o. twice daily for now -Patient tolerates Dicyclomine without dizziness or lightheadedness.  May increase Dicyclomine 10 mg 1 tab twice daily if needed.  -Diet as tolerated -Follow-up with Dr. Ardis Hughs in 3 months and as needed -Repeat surveillance colonoscopy 10/2021  2) Mild normocytic anemia -CBC, CMP -Continue Ferrous Sulfate 325mg  one po bid for now

## 2021-01-20 NOTE — Patient Instructions (Addendum)
If you are age 76 or older, your body mass index should be between 23-30. Your Body mass index is 32.7 kg/m. If this is out of the aforementioned range listed, please consider follow up with your Primary Care Provider. The [place] [facility] providers would like to encourage you to use [facility] to communicate with providers for non-urgent requests or questions.  Due to long hold times on the telephone, sending your provider a message by [facility] may be faster and more efficient way to get a response. Please allow 48 business hours for a response.  Please remember that this is for non-urgent requests/questions.  LABS:  Lab work has been ordered for you today. Our lab is located in the basement. Press "B" on the elevator. The lab is located at the first door on the left as you exit the elevator.  HEALTHCARE LAWS AND MY CHART RESULTS: Due to recent changes in healthcare laws, you may see the results of your imaging and laboratory studies on MyChart before your provider has had a chance to review them.   We understand that in some cases there may be results that are confusing or concerning to you. Not all laboratory results come back in the same time frame and the provider may be waiting for multiple results in order to interpret others.  Please give [place] 48 hours in order for your provider to thoroughly review all the results before contacting the office for clarification of your results.   RECOMMENDATIONS: Florastor Probiotic- take 1 twice a day for 4-6 weeks or any other probiotic of your choice. Continue Metamucil, if you can not tolerate it then change to Benefiber 1 tablespoon daily. Use Lactaid with any dairy product. Follow up with Dr. Ardis Hughs in 3 months. Continue Dicyclomine 10 MG tablet 1-2 times a day as needed.  It was great seeing you today! Thank you for entrusting me with your care and choosing [facility].  Noralyn Pick, CRNP

## 2021-01-21 NOTE — Progress Notes (Signed)
I agree with the above note, plan 

## 2021-01-22 DIAGNOSIS — I129 Hypertensive chronic kidney disease with stage 1 through stage 4 chronic kidney disease, or unspecified chronic kidney disease: Secondary | ICD-10-CM | POA: Diagnosis not present

## 2021-01-22 DIAGNOSIS — K219 Gastro-esophageal reflux disease without esophagitis: Secondary | ICD-10-CM | POA: Diagnosis not present

## 2021-01-22 DIAGNOSIS — J45909 Unspecified asthma, uncomplicated: Secondary | ICD-10-CM | POA: Diagnosis not present

## 2021-01-22 DIAGNOSIS — E785 Hyperlipidemia, unspecified: Secondary | ICD-10-CM | POA: Diagnosis not present

## 2021-01-22 DIAGNOSIS — F419 Anxiety disorder, unspecified: Secondary | ICD-10-CM | POA: Diagnosis not present

## 2021-01-22 DIAGNOSIS — Z8711 Personal history of peptic ulcer disease: Secondary | ICD-10-CM | POA: Diagnosis not present

## 2021-01-22 DIAGNOSIS — K6389 Other specified diseases of intestine: Secondary | ICD-10-CM | POA: Diagnosis not present

## 2021-01-22 DIAGNOSIS — N182 Chronic kidney disease, stage 2 (mild): Secondary | ICD-10-CM | POA: Diagnosis not present

## 2021-01-22 DIAGNOSIS — K760 Fatty (change of) liver, not elsewhere classified: Secondary | ICD-10-CM | POA: Diagnosis not present

## 2021-01-22 DIAGNOSIS — K580 Irritable bowel syndrome with diarrhea: Secondary | ICD-10-CM | POA: Diagnosis not present

## 2021-01-22 DIAGNOSIS — C180 Malignant neoplasm of cecum: Secondary | ICD-10-CM | POA: Diagnosis not present

## 2021-01-22 DIAGNOSIS — D0512 Intraductal carcinoma in situ of left breast: Secondary | ICD-10-CM | POA: Diagnosis not present

## 2021-01-25 DIAGNOSIS — K219 Gastro-esophageal reflux disease without esophagitis: Secondary | ICD-10-CM | POA: Diagnosis not present

## 2021-01-25 DIAGNOSIS — Z483 Aftercare following surgery for neoplasm: Secondary | ICD-10-CM | POA: Diagnosis not present

## 2021-01-25 DIAGNOSIS — K5792 Diverticulitis of intestine, part unspecified, without perforation or abscess without bleeding: Secondary | ICD-10-CM | POA: Diagnosis not present

## 2021-01-25 DIAGNOSIS — K760 Fatty (change of) liver, not elsewhere classified: Secondary | ICD-10-CM | POA: Diagnosis not present

## 2021-01-25 DIAGNOSIS — K580 Irritable bowel syndrome with diarrhea: Secondary | ICD-10-CM | POA: Diagnosis not present

## 2021-01-25 DIAGNOSIS — I129 Hypertensive chronic kidney disease with stage 1 through stage 4 chronic kidney disease, or unspecified chronic kidney disease: Secondary | ICD-10-CM | POA: Diagnosis not present

## 2021-01-25 NOTE — Telephone Encounter (Signed)
Beth, pls contact the patient and let her know she should continue taking Ferrous Sulfate 325mg  one po bid since her Hg level is just at the low end of normal. Pls send he to our lab to have a repeat CBC, IBC and ferritin panel in 2 months. If her repeat labs in 2 months show further improvement we might be able to reduce her iron to QD but we'll see.  She can continue Lomotil one bid for the next month, then reduce to once daily if tolerated. THX

## 2021-01-27 DIAGNOSIS — K5792 Diverticulitis of intestine, part unspecified, without perforation or abscess without bleeding: Secondary | ICD-10-CM | POA: Diagnosis not present

## 2021-01-27 DIAGNOSIS — I129 Hypertensive chronic kidney disease with stage 1 through stage 4 chronic kidney disease, or unspecified chronic kidney disease: Secondary | ICD-10-CM | POA: Diagnosis not present

## 2021-01-27 DIAGNOSIS — K580 Irritable bowel syndrome with diarrhea: Secondary | ICD-10-CM | POA: Diagnosis not present

## 2021-01-27 DIAGNOSIS — Z483 Aftercare following surgery for neoplasm: Secondary | ICD-10-CM | POA: Diagnosis not present

## 2021-01-27 DIAGNOSIS — K760 Fatty (change of) liver, not elsewhere classified: Secondary | ICD-10-CM | POA: Diagnosis not present

## 2021-01-27 DIAGNOSIS — K219 Gastro-esophageal reflux disease without esophagitis: Secondary | ICD-10-CM | POA: Diagnosis not present

## 2021-02-02 DIAGNOSIS — K580 Irritable bowel syndrome with diarrhea: Secondary | ICD-10-CM | POA: Diagnosis not present

## 2021-02-02 DIAGNOSIS — Z483 Aftercare following surgery for neoplasm: Secondary | ICD-10-CM | POA: Diagnosis not present

## 2021-02-02 DIAGNOSIS — I129 Hypertensive chronic kidney disease with stage 1 through stage 4 chronic kidney disease, or unspecified chronic kidney disease: Secondary | ICD-10-CM | POA: Diagnosis not present

## 2021-02-02 DIAGNOSIS — K760 Fatty (change of) liver, not elsewhere classified: Secondary | ICD-10-CM | POA: Diagnosis not present

## 2021-02-02 DIAGNOSIS — K5792 Diverticulitis of intestine, part unspecified, without perforation or abscess without bleeding: Secondary | ICD-10-CM | POA: Diagnosis not present

## 2021-02-02 DIAGNOSIS — K219 Gastro-esophageal reflux disease without esophagitis: Secondary | ICD-10-CM | POA: Diagnosis not present

## 2021-02-05 DIAGNOSIS — E785 Hyperlipidemia, unspecified: Secondary | ICD-10-CM | POA: Diagnosis not present

## 2021-02-05 DIAGNOSIS — K580 Irritable bowel syndrome with diarrhea: Secondary | ICD-10-CM | POA: Diagnosis not present

## 2021-02-05 DIAGNOSIS — J45909 Unspecified asthma, uncomplicated: Secondary | ICD-10-CM | POA: Diagnosis not present

## 2021-02-05 DIAGNOSIS — I129 Hypertensive chronic kidney disease with stage 1 through stage 4 chronic kidney disease, or unspecified chronic kidney disease: Secondary | ICD-10-CM | POA: Diagnosis not present

## 2021-02-05 DIAGNOSIS — E669 Obesity, unspecified: Secondary | ICD-10-CM | POA: Diagnosis not present

## 2021-02-05 DIAGNOSIS — G894 Chronic pain syndrome: Secondary | ICD-10-CM | POA: Diagnosis not present

## 2021-02-05 DIAGNOSIS — F419 Anxiety disorder, unspecified: Secondary | ICD-10-CM | POA: Diagnosis not present

## 2021-02-05 DIAGNOSIS — K219 Gastro-esophageal reflux disease without esophagitis: Secondary | ICD-10-CM | POA: Diagnosis not present

## 2021-02-05 DIAGNOSIS — N182 Chronic kidney disease, stage 2 (mild): Secondary | ICD-10-CM | POA: Diagnosis not present

## 2021-02-16 DIAGNOSIS — U071 COVID-19: Secondary | ICD-10-CM | POA: Diagnosis not present

## 2021-02-18 DIAGNOSIS — U071 COVID-19: Secondary | ICD-10-CM | POA: Diagnosis not present

## 2021-02-19 DIAGNOSIS — U071 COVID-19: Secondary | ICD-10-CM | POA: Diagnosis not present

## 2021-02-19 DIAGNOSIS — K219 Gastro-esophageal reflux disease without esophagitis: Secondary | ICD-10-CM | POA: Diagnosis not present

## 2021-02-19 DIAGNOSIS — K580 Irritable bowel syndrome with diarrhea: Secondary | ICD-10-CM | POA: Diagnosis not present

## 2021-02-19 DIAGNOSIS — J301 Allergic rhinitis due to pollen: Secondary | ICD-10-CM | POA: Diagnosis not present

## 2021-02-19 DIAGNOSIS — E785 Hyperlipidemia, unspecified: Secondary | ICD-10-CM | POA: Diagnosis not present

## 2021-02-19 DIAGNOSIS — I10 Essential (primary) hypertension: Secondary | ICD-10-CM | POA: Diagnosis not present

## 2021-02-19 DIAGNOSIS — J45909 Unspecified asthma, uncomplicated: Secondary | ICD-10-CM | POA: Diagnosis not present

## 2021-02-24 ENCOUNTER — Telehealth: Payer: Self-pay | Admitting: Nurse Practitioner

## 2021-02-24 DIAGNOSIS — R531 Weakness: Secondary | ICD-10-CM | POA: Diagnosis not present

## 2021-02-24 DIAGNOSIS — U071 COVID-19: Secondary | ICD-10-CM | POA: Diagnosis not present

## 2021-02-24 DIAGNOSIS — F419 Anxiety disorder, unspecified: Secondary | ICD-10-CM | POA: Diagnosis not present

## 2021-02-24 DIAGNOSIS — I10 Essential (primary) hypertension: Secondary | ICD-10-CM | POA: Diagnosis not present

## 2021-02-24 NOTE — Telephone Encounter (Signed)
Inbound call from patient PCP office, Lattie Haw. States PCP wants to prescribe patient prednisone 20mg  for 5 days but wants to make sure that is okay for her with her recent history of IBS and procedure. Best call back number [phone]

## 2021-02-24 NOTE — Telephone Encounter (Signed)
Information given to Colleen Hancock's staff. Contact information for Dr Johney Maine of [facility] shared.

## 2021-02-24 NOTE — Telephone Encounter (Signed)
Dr Ardis Hughs Would you address this please? Thanks

## 2021-02-25 DIAGNOSIS — C180 Malignant neoplasm of cecum: Secondary | ICD-10-CM | POA: Diagnosis not present

## 2021-02-25 DIAGNOSIS — E785 Hyperlipidemia, unspecified: Secondary | ICD-10-CM | POA: Diagnosis not present

## 2021-02-25 DIAGNOSIS — Z85038 Personal history of other malignant neoplasm of large intestine: Secondary | ICD-10-CM | POA: Diagnosis not present

## 2021-02-25 DIAGNOSIS — N182 Chronic kidney disease, stage 2 (mild): Secondary | ICD-10-CM | POA: Diagnosis not present

## 2021-02-25 DIAGNOSIS — J45909 Unspecified asthma, uncomplicated: Secondary | ICD-10-CM | POA: Diagnosis not present

## 2021-02-25 DIAGNOSIS — I129 Hypertensive chronic kidney disease with stage 1 through stage 4 chronic kidney disease, or unspecified chronic kidney disease: Secondary | ICD-10-CM | POA: Diagnosis not present

## 2021-02-25 DIAGNOSIS — D0512 Intraductal carcinoma in situ of left breast: Secondary | ICD-10-CM | POA: Diagnosis not present

## 2021-03-02 DIAGNOSIS — Z8616 Personal history of COVID-19: Secondary | ICD-10-CM | POA: Diagnosis not present

## 2021-03-02 DIAGNOSIS — Z85038 Personal history of other malignant neoplasm of large intestine: Secondary | ICD-10-CM | POA: Diagnosis not present

## 2021-03-02 DIAGNOSIS — G4700 Insomnia, unspecified: Secondary | ICD-10-CM | POA: Diagnosis not present

## 2021-03-02 DIAGNOSIS — R109 Unspecified abdominal pain: Secondary | ICD-10-CM | POA: Diagnosis not present

## 2021-03-02 DIAGNOSIS — I10 Essential (primary) hypertension: Secondary | ICD-10-CM | POA: Diagnosis not present

## 2021-03-02 DIAGNOSIS — R630 Anorexia: Secondary | ICD-10-CM | POA: Diagnosis not present

## 2021-03-02 DIAGNOSIS — F419 Anxiety disorder, unspecified: Secondary | ICD-10-CM | POA: Diagnosis not present

## 2021-03-04 DIAGNOSIS — J45909 Unspecified asthma, uncomplicated: Secondary | ICD-10-CM | POA: Diagnosis not present

## 2021-03-04 DIAGNOSIS — Z79891 Long term (current) use of opiate analgesic: Secondary | ICD-10-CM | POA: Diagnosis not present

## 2021-03-04 DIAGNOSIS — Z9181 History of falling: Secondary | ICD-10-CM | POA: Diagnosis not present

## 2021-03-04 DIAGNOSIS — Z7952 Long term (current) use of systemic steroids: Secondary | ICD-10-CM | POA: Diagnosis not present

## 2021-03-04 DIAGNOSIS — F419 Anxiety disorder, unspecified: Secondary | ICD-10-CM | POA: Diagnosis not present

## 2021-03-04 DIAGNOSIS — N182 Chronic kidney disease, stage 2 (mild): Secondary | ICD-10-CM | POA: Diagnosis not present

## 2021-03-04 DIAGNOSIS — G4700 Insomnia, unspecified: Secondary | ICD-10-CM | POA: Diagnosis not present

## 2021-03-04 DIAGNOSIS — Z9049 Acquired absence of other specified parts of digestive tract: Secondary | ICD-10-CM | POA: Diagnosis not present

## 2021-03-04 DIAGNOSIS — K580 Irritable bowel syndrome with diarrhea: Secondary | ICD-10-CM | POA: Diagnosis not present

## 2021-03-04 DIAGNOSIS — R011 Cardiac murmur, unspecified: Secondary | ICD-10-CM | POA: Diagnosis not present

## 2021-03-04 DIAGNOSIS — R630 Anorexia: Secondary | ICD-10-CM | POA: Diagnosis not present

## 2021-03-04 DIAGNOSIS — M150 Primary generalized (osteo)arthritis: Secondary | ICD-10-CM | POA: Diagnosis not present

## 2021-03-04 DIAGNOSIS — K219 Gastro-esophageal reflux disease without esophagitis: Secondary | ICD-10-CM | POA: Diagnosis not present

## 2021-03-04 DIAGNOSIS — I129 Hypertensive chronic kidney disease with stage 1 through stage 4 chronic kidney disease, or unspecified chronic kidney disease: Secondary | ICD-10-CM | POA: Diagnosis not present

## 2021-03-04 DIAGNOSIS — Z85068 Personal history of other malignant neoplasm of small intestine: Secondary | ICD-10-CM | POA: Diagnosis not present

## 2021-03-04 DIAGNOSIS — E7800 Pure hypercholesterolemia, unspecified: Secondary | ICD-10-CM | POA: Diagnosis not present

## 2021-03-04 DIAGNOSIS — K760 Fatty (change of) liver, not elsewhere classified: Secondary | ICD-10-CM | POA: Diagnosis not present

## 2021-03-04 DIAGNOSIS — G8918 Other acute postprocedural pain: Secondary | ICD-10-CM | POA: Diagnosis not present

## 2021-03-09 DIAGNOSIS — C180 Malignant neoplasm of cecum: Secondary | ICD-10-CM | POA: Diagnosis not present

## 2021-03-09 DIAGNOSIS — I129 Hypertensive chronic kidney disease with stage 1 through stage 4 chronic kidney disease, or unspecified chronic kidney disease: Secondary | ICD-10-CM | POA: Diagnosis not present

## 2021-03-09 DIAGNOSIS — D0512 Intraductal carcinoma in situ of left breast: Secondary | ICD-10-CM | POA: Diagnosis not present

## 2021-03-09 DIAGNOSIS — E785 Hyperlipidemia, unspecified: Secondary | ICD-10-CM | POA: Diagnosis not present

## 2021-03-09 DIAGNOSIS — N182 Chronic kidney disease, stage 2 (mild): Secondary | ICD-10-CM | POA: Diagnosis not present

## 2021-03-09 DIAGNOSIS — J45909 Unspecified asthma, uncomplicated: Secondary | ICD-10-CM | POA: Diagnosis not present

## 2021-03-09 DIAGNOSIS — Z85038 Personal history of other malignant neoplasm of large intestine: Secondary | ICD-10-CM | POA: Diagnosis not present

## 2021-03-11 DIAGNOSIS — K219 Gastro-esophageal reflux disease without esophagitis: Secondary | ICD-10-CM | POA: Diagnosis not present

## 2021-03-11 DIAGNOSIS — N182 Chronic kidney disease, stage 2 (mild): Secondary | ICD-10-CM | POA: Diagnosis not present

## 2021-03-11 DIAGNOSIS — G8918 Other acute postprocedural pain: Secondary | ICD-10-CM | POA: Diagnosis not present

## 2021-03-11 DIAGNOSIS — I129 Hypertensive chronic kidney disease with stage 1 through stage 4 chronic kidney disease, or unspecified chronic kidney disease: Secondary | ICD-10-CM | POA: Diagnosis not present

## 2021-03-11 DIAGNOSIS — E7800 Pure hypercholesterolemia, unspecified: Secondary | ICD-10-CM | POA: Diagnosis not present

## 2021-03-11 DIAGNOSIS — K760 Fatty (change of) liver, not elsewhere classified: Secondary | ICD-10-CM | POA: Diagnosis not present

## 2021-03-16 DIAGNOSIS — K580 Irritable bowel syndrome with diarrhea: Secondary | ICD-10-CM | POA: Diagnosis not present

## 2021-03-16 DIAGNOSIS — F419 Anxiety disorder, unspecified: Secondary | ICD-10-CM | POA: Diagnosis not present

## 2021-03-16 DIAGNOSIS — Z85038 Personal history of other malignant neoplasm of large intestine: Secondary | ICD-10-CM | POA: Diagnosis not present

## 2021-03-16 DIAGNOSIS — N182 Chronic kidney disease, stage 2 (mild): Secondary | ICD-10-CM | POA: Diagnosis not present

## 2021-03-16 DIAGNOSIS — K219 Gastro-esophageal reflux disease without esophagitis: Secondary | ICD-10-CM | POA: Diagnosis not present

## 2021-03-16 DIAGNOSIS — K760 Fatty (change of) liver, not elsewhere classified: Secondary | ICD-10-CM | POA: Diagnosis not present

## 2021-03-16 DIAGNOSIS — E7800 Pure hypercholesterolemia, unspecified: Secondary | ICD-10-CM | POA: Diagnosis not present

## 2021-03-16 DIAGNOSIS — G8918 Other acute postprocedural pain: Secondary | ICD-10-CM | POA: Diagnosis not present

## 2021-03-16 DIAGNOSIS — I129 Hypertensive chronic kidney disease with stage 1 through stage 4 chronic kidney disease, or unspecified chronic kidney disease: Secondary | ICD-10-CM | POA: Diagnosis not present

## 2021-03-17 DIAGNOSIS — E7800 Pure hypercholesterolemia, unspecified: Secondary | ICD-10-CM | POA: Diagnosis not present

## 2021-03-17 DIAGNOSIS — I129 Hypertensive chronic kidney disease with stage 1 through stage 4 chronic kidney disease, or unspecified chronic kidney disease: Secondary | ICD-10-CM | POA: Diagnosis not present

## 2021-03-17 DIAGNOSIS — K219 Gastro-esophageal reflux disease without esophagitis: Secondary | ICD-10-CM | POA: Diagnosis not present

## 2021-03-17 DIAGNOSIS — N182 Chronic kidney disease, stage 2 (mild): Secondary | ICD-10-CM | POA: Diagnosis not present

## 2021-03-17 DIAGNOSIS — G8918 Other acute postprocedural pain: Secondary | ICD-10-CM | POA: Diagnosis not present

## 2021-03-17 DIAGNOSIS — K760 Fatty (change of) liver, not elsewhere classified: Secondary | ICD-10-CM | POA: Diagnosis not present

## 2021-03-18 ENCOUNTER — Telehealth: Payer: Self-pay | Admitting: *Deleted

## 2021-03-18 NOTE — Telephone Encounter (Signed)
Received call from pt stating she was diagnosed September 2022 with Colon Cancer.  Pt states she underwent surgery with Dr. Johney Maine at [facility] who removed part of her colon and appendix.  Pt states surgeon notified her that she will not need chemotherapy and to continue to f/u with GI provider.  Pt requesting to see MD regarding recent surgery and diagnosis for second opinion.  Appt scheduled, pt verbalized understanding of date and time.

## 2021-03-22 ENCOUNTER — Other Ambulatory Visit: Payer: Self-pay

## 2021-03-22 ENCOUNTER — Inpatient Hospital Stay: Payer: Medicare Other | Attending: Hematology and Oncology | Admitting: Hematology and Oncology

## 2021-03-22 VITALS — BP 158/62 | HR 79 | Temp 97.9°F | Resp 18 | Ht 63.75 in | Wt 182.9 lb

## 2021-03-22 DIAGNOSIS — Z9049 Acquired absence of other specified parts of digestive tract: Secondary | ICD-10-CM | POA: Insufficient documentation

## 2021-03-22 DIAGNOSIS — C180 Malignant neoplasm of cecum: Secondary | ICD-10-CM | POA: Diagnosis not present

## 2021-03-22 DIAGNOSIS — D120 Benign neoplasm of cecum: Secondary | ICD-10-CM | POA: Diagnosis not present

## 2021-03-22 DIAGNOSIS — Z886 Allergy status to analgesic agent status: Secondary | ICD-10-CM | POA: Diagnosis not present

## 2021-03-22 DIAGNOSIS — Z79899 Other long term (current) drug therapy: Secondary | ICD-10-CM | POA: Insufficient documentation

## 2021-03-22 DIAGNOSIS — D0512 Intraductal carcinoma in situ of left breast: Secondary | ICD-10-CM | POA: Insufficient documentation

## 2021-03-22 DIAGNOSIS — Z885 Allergy status to narcotic agent status: Secondary | ICD-10-CM | POA: Diagnosis not present

## 2021-03-22 NOTE — Assessment & Plan Note (Signed)
10/21/2020: Colonoscopy: Cecal polyp biopsy: Invasive adenocarcinoma 12/10/2020: Robotic colectomy: No residual invasive cancer, 0/12 lymph nodes negative Preoperative CEA was not elevated Patient has appointments with Dr. Ardis Hughs with gastroenterology to schedule her for colonoscopy surveillance I discussed with her that a CT abdomen pelvis could be done in 1 year for surveillance.  Return to clinic in 1 year for follow-up

## 2021-03-22 NOTE — Progress Notes (Signed)
Patient Care Team: Velna Hatchet, MD as PCP - General (Internal Medicine) Michael Boston, MD as Consulting Physician (General Surgery) Milus Banister, MD as Attending Physician (Gastroenterology) Janyth Contes, MD as Consulting Physician (Obstetrics and Gynecology) Nicholas Lose, MD as Consulting Physician (Hematology and Oncology)  DIAGNOSIS:  Encounter Diagnoses  Name Primary?   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     SUMMARY OF ONCOLOGIC HISTORY: Oncology History  Ductal carcinoma in situ (DCIS) of left breast  09/2012 Initial Diagnosis   Mammographically detected left breast abnormality biopsy-proven high-grade  DCIS ER/PR/ HER-2 negative status post lumpectomy (SLN neg) followed by radiation (in [place])   10/2012 - 2018 Anti-estrogen oral therapy   Tamoxifen 20 mg daily started in [place]    07/26/2016 Relapse/Recurrence   Left breast suspicious 0.9 cm group of linear branching calcifications, biopsy revealed DCIS with calcifications ER 0%, PR 0%   08/22/2016 Surgery   Left simple mastectomy: DCIS with necrosis and calcifications, grade 3, margins negative, 0/1 lymph node negative, ER 0%, PR 0% stage 0   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  10/21/2020 Initial Diagnosis   Colonoscopy: Cecal polyp: Biopsy: Invasive adenocarcinoma involving fragments of a tubular adenoma with high-grade dysplasia   12/10/2020 Surgery   Robotic colectomy 12/10/2020 (no residual invasive cancer) 0/12 lymph nodes negative   03/22/2021 Cancer Staging   Staging form: Colon and Rectum, AJCC 8th Edition - Clinical stage from 03/22/2021: Stage I (cT1, cN0, cM0) - Signed by Nicholas Lose, MD on 03/22/2021 Stage prefix: Initial diagnosis Total positive nodes: 0 Histologic grade (G): G1 Histologic grading system: 4 grade system      CHIEF COMPLIANT:   INTERVAL HISTORY: [facility] is  a   ALLERGIES:  is allergic to fentanyl, tetracyclines & related, other, sodium lactate, sucrose, clams [shellfish allergy], nsaids, pentazocine lactate, and tylenol [acetaminophen].  MEDICATIONS:  Current Outpatient Medications  Medication Sig Dispense Refill   albuterol (PROVENTIL HFA;VENTOLIN HFA) 108 (90 BASE) MCG/ACT inhaler Inhale 1 puff into the lungs every 6 (six) hours as needed for wheezing or shortness of breath (Asthma).     atorvastatin (LIPITOR) 20 MG tablet Take 20 mg by mouth daily before breakfast.     cetirizine (ZYRTEC ALLERGY) 10 MG tablet Take 1 tablet (10 mg total) by mouth daily. (Patient taking differently: Take 10 mg by mouth every evening.)     cholestyramine (QUESTRAN) 4 GM/DOSE powder Take 0.75 packets by mouth daily after breakfast.     dicyclomine (BENTYL) 10 MG capsule Take 10 mg by mouth daily at 6 (six) AM. (1st thing in the morning)     diphenoxylate-atropine (LOMOTIL) 2.5-0.025 MG tablet Take 1 tablet by mouth in the morning and at bedtime. 60 tablet 5   diphenoxylate-atropine (LOMOTIL) 2.5-0.025 MG tablet Take 1 tablet by mouth 4 (four) times daily as needed for diarrhea or loose stools. 60 tablet 10   ferrous sulfate 325 (65 FE) MG tablet Take 1 tablet (325 mg total) by mouth 2 (two) times daily with a meal. 40 tablet 2   fluticasone (FLONASE) 50 MCG/ACT nasal spray Place 1 spray into both nostrils daily as needed.     gabapentin (NEURONTIN) 300 MG capsule Take 1 capsule (300 mg total) by mouth 3 (three) times daily. 60 capsule 3   lisinopril (ZESTRIL) 20 MG tablet Take 20 mg by mouth at bedtime.     Multiple Vitamins-Minerals (MULTIVITAMIN  GUMMIES WOMENS PO) Take 1 tablet by mouth every evening. One-A-Day Women's  pill     Omega-3 Fatty Acids (FISH OIL) 1200 MG CAPS Take 1,200 mg by mouth every evening.     Polyethyl Glycol-Propyl Glycol (SYSTANE) 0.4-0.3 % SOLN Place 1 drop into both eyes daily as needed (dry eyes).     traMADol (ULTRAM) 50 MG tablet Take  1-2 tablets (50-100 mg total) by mouth every 6 (six) hours as needed for moderate pain (for pain.). 30 tablet 0   Current Facility-Administered Medications  Medication Dose Route Frequency Provider Last Rate Last Admin   0.9 %  sodium chloride infusion  500 mL Intravenous Once Milus Banister, MD        PHYSICAL EXAMINATION: ECOG PERFORMANCE STATUS: 1 - Symptomatic but completely ambulatory  Vitals:   03/22/21 1459  BP: (!) 158/62  Pulse: 79  Resp: 18  Temp: 97.9 F (36.6 C)  SpO2: 97%   Filed Weights   03/22/21 1459  Weight: 182 lb 14.4 oz (83 kg)      LABORATORY DATA:  I have reviewed the data as listed CMP Latest Ref Rng & Units 01/20/2021 12/14/2020 12/11/2020  Glucose 70 - 99 mg/dL 100(H) 145(H) 139(H)  BUN 6 - 23 mg/dL 9 24(H) 12  Creatinine 0.40 - 1.20 mg/dL 0.91 0.96 0.92  Sodium 135 - 145 mEq/L 141 131(L) 137  Potassium 3.5 - 5.1 mEq/L 4.0 4.9 4.7  Chloride 96 - 112 mEq/L 104 103 106  CO2 19 - 32 mEq/L 27 21(L) 24  Calcium 8.4 - 10.5 mg/dL 9.5 9.3 8.9  Total Protein 6.0 - 8.3 g/dL 7.2 - -  Total Bilirubin 0.2 - 1.2 mg/dL 0.5 - -  Alkaline Phos 39 - 117 U/L 72 - -  AST 0 - 37 U/L 24 - -  ALT 0 - 35 U/L 26 - -    Lab Results  Component Value Date   WBC 6.1 01/20/2021   HGB 12.7 01/20/2021   HCT 37.5 01/20/2021   MCV 96.5 01/20/2021   PLT 196.0 01/20/2021   NEUTROABS 4.0 01/20/2021    ASSESSMENT & PLAN:  Ductal carcinoma in situ (DCIS) of left breast Left breast DCIS ER/PR negative high-grade status post lumpectomy July 2014 took adjuvant tamoxifen since September 2014 discontinued May 2018.   Recurrence: 07/25/2016: Suspicious 0.9 cm group of calcifications lower inner quadrant left breast biopsy-proven to be ER/PR negative high-grade DCIS   08/22/2016: Left simple mastectomy: DCIS with necrosis and calcifications, grade 3, margins negative, 0/1 lymph node negative, ER 0%, PR 0% stage  0 ----------------------------------------------------------- Recommendation: No role of antiestrogen therapy   Breast cancer surveillance: 1.  Mammogram and U/S right breast 08/06/2019: No evidence of malignancy. 2.  Breast exam 05/05/2020: Benign Her husband is at home with hospice and she needs to stay healthy so that she can take care of him. Return to clinic in 1 year for follow-up  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 10/21/2020: Colonoscopy: Cecal polyp biopsy: Invasive adenocarcinoma 12/10/2020: Robotic colectomy: No residual invasive cancer, 0/12 lymph nodes negative Preoperative CEA was not elevated Patient has appointments with Dr. Ardis Hughs with gastroenterology to schedule her for colonoscopy surveillance I discussed with her that a CT abdomen pelvis could be done in 1 year for surveillance.  Return to clinic in 1 year for follow-up    Orders Placed This Encounter  Procedures   CBC with Differential ([place] Only)    Standing Status:   Future  Standing Expiration Date:   03/22/2022   CMP ([place] only)    Standing Status:   Future    Standing Expiration Date:   03/22/2022   CEA (IN HOUSE-[facility])FOR [facility] ONLY    Standing Status:   Future    Standing Expiration Date:   03/22/2022   The patient has a good understanding of the overall plan. she agrees with it. she will call with any problems that may develop before the next visit here. Total time spent: 30 mins including face to face time and time spent for planning, charting and co-ordination of care   Harriette Ohara, MD 03/22/21

## 2021-03-22 NOTE — Assessment & Plan Note (Signed)
Left breast DCIS ER/PR negative high-grade status post lumpectomy July 2014 took adjuvant tamoxifen since September 2014discontinued May 2018.  Recurrence: 07/25/2016: Suspicious 0.9 cm group of calcifications lower inner quadrant left breast biopsy-proven to be ER/PR negative high-grade DCIS  08/22/2016:Left simple mastectomy: DCIS with necrosis and calcifications, grade 3, margins negative, 0/1 lymph node negative, ER 0%, PR 0% stage 0 ----------------------------------------------------------- Recommendation: No role of antiestrogen therapy  Breast cancer surveillance: 1. Mammogram and U/S right breast6/03/2019: No evidence of malignancy. 2.Breast exam3/03/2020: Benign Her husband is at home with hospice and she needs to stay healthy so that she can take care of him. Return to clinic in 1 year for follow-up

## 2021-03-23 DIAGNOSIS — E7800 Pure hypercholesterolemia, unspecified: Secondary | ICD-10-CM | POA: Diagnosis not present

## 2021-03-23 DIAGNOSIS — K219 Gastro-esophageal reflux disease without esophagitis: Secondary | ICD-10-CM | POA: Diagnosis not present

## 2021-03-23 DIAGNOSIS — K760 Fatty (change of) liver, not elsewhere classified: Secondary | ICD-10-CM | POA: Diagnosis not present

## 2021-03-23 DIAGNOSIS — G8918 Other acute postprocedural pain: Secondary | ICD-10-CM | POA: Diagnosis not present

## 2021-03-23 DIAGNOSIS — N182 Chronic kidney disease, stage 2 (mild): Secondary | ICD-10-CM | POA: Diagnosis not present

## 2021-03-23 DIAGNOSIS — I129 Hypertensive chronic kidney disease with stage 1 through stage 4 chronic kidney disease, or unspecified chronic kidney disease: Secondary | ICD-10-CM | POA: Diagnosis not present

## 2021-03-26 ENCOUNTER — Ambulatory Visit (INDEPENDENT_AMBULATORY_CARE_PROVIDER_SITE_OTHER): Payer: Medicare Other | Admitting: Gastroenterology

## 2021-03-26 ENCOUNTER — Encounter: Payer: Self-pay | Admitting: Gastroenterology

## 2021-03-26 VITALS — BP 154/50 | HR 78 | Ht 63.75 in | Wt 180.0 lb

## 2021-03-26 DIAGNOSIS — C189 Malignant neoplasm of colon, unspecified: Secondary | ICD-10-CM

## 2021-03-26 NOTE — Patient Instructions (Addendum)
If you are age 77 or younger, your body mass index should be between 19-25. Your Body mass index is 31.14 kg/m. If this is out of the aformentioned range listed, please consider follow up with your Primary Care Provider.  ________________________________________________________  The [place] [facility] providers would like to encourage you to use [facility] to communicate with providers for non-urgent requests or questions.  Due to long hold times on the telephone, sending your provider a message by [facility] may be a faster and more efficient way to get a response.  Please allow 48 business hours for a response.  Please remember that this is for non-urgent requests.  _______________________________________________________  It has been recommended that you can titrate cholestyramine upward as needed.  You are due for colonoscopy in October 2023.  We will contact you to schedule this appointment.  Thank you for entrusting me with your care and choosing [facility].  Dr Ardis Hughs

## 2021-03-26 NOTE — Progress Notes (Signed)
Review of pertinent gastrointestinal problems: 1.  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  Status post robotic "proximal colectomy" October 2022.  T1 N0 M0.   HPI: This is a very pleasant 76 year old woman  She was here in our office about 2 months ago when she saw [facility].  She had been having difficulty since her right colectomy about a month prior with multiple soft stools and diarrhea.  She was told to continue Metamucil.  She was told to start probiotic.  She was recommended to continue Lomotil twice daily.  She was told to increase her dicyclomine to 10 mg twice daily.  Blood work November 2022 CBC was normal complete metabolic profile was normal  She is here today with her daughter.  Her bowels are gradually getting back to her baseline which is chronic IBS-D type bowel habits.  She takes Lomotil only about once or twice a month.  She takes her Bentyl 10 mg every day and has been on it for many years.  She is on about a half a dose of Questran daily and she has been on it for many years.  She takes Metamucil and she is very happy with the results of the Metamucil.  She knows that it definitely firms up her stool, she has missed a couple days and noticed they get loose again.  ROS: complete GI ROS as described in HPI, all other review negative.  Constitutional:  No unintentional weight loss   Past Medical History:  Diagnosis Date   Allergy    Anxiety 09/14/2020   Arthritis    hands, knees   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   Breast CA ([place]) 10/2012   Left Breast   Complication of anesthesia    during colonoscopy fentanyl dropped blood pressure 2013 or 2014   Diverticulitis 09/14/2020   no meds currently   Dyspnea 09/14/2020   on exertion can climb flight of steps   Fatty liver 09/14/2020   GERD (gastroesophageal reflux disease)    Heart murmur 09/14/2020   mild no cardiologist   High cholesterol 09/14/2020   History of migraine    none since 1990's    Hypertension 09/14/2020   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   with diarrhea   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   Pneumonia    yrs ago per pt on 09-14-2020   Swelling of joint of left shoulder 09/14/2020   swelling under left shoulder, 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   Thickened endometrium 09/14/2020   Wears glasses 09/14/2020    Past Surgical History:  Procedure Laterality Date   APPENDECTOMY  12/10/2020   BREAST BIOPSY Left 10/2012   Stereo- Malignant   BREAST BIOPSY Left    U/S Core- Benign   BREAST EXCISIONAL BIOPSY Left    BREAST EXCISIONAL BIOPSY Left    BREAST LUMPECTOMY Left 11/2012   radiation tx done x 34 treatments   CESAREAN SECTION  1972   CHOLECYSTECTOMY  2010   laparoscopic   COLON RESECTION  12/10/2020   COLONOSCOPY     2013 or 2014   DILATION AND CURETTAGE OF UTERUS  1967   HYSTEROSCOPY WITH D & C N/A 09/18/2020   Procedure: DILATATION AND CURETTAGE /HYSTEROSCOPY/ POLYPECTOMY;  Surgeon: Janyth Contes, MD;  Location: [facility];  Service: Gynecology;  Laterality: N/A;   MASTECTOMY Left 2018   MASTECTOMY W/ SENTINEL NODE BIOPSY Left 08/18/2016   MASTECTOMY W/ SENTINEL NODE BIOPSY  Left 08/18/2016   Procedure: LEFT TOTAL MASTECTOMY WITH SENTINEL LYMPH NODE BIOPSY WITH BLUE DYE INJECTION;  Surgeon: Rolm Bookbinder, MD;  Location: [facility];  Service: General;  Laterality: Left;   SKIN CANCER EXCISION Left 2009   eye brow    Current Outpatient Medications  Medication Sig Dispense Refill   albuterol (PROVENTIL HFA;VENTOLIN HFA) 108 (90 BASE) MCG/ACT inhaler Inhale 1 puff into the lungs every 6 (six) hours as needed for wheezing or shortness of breath (Asthma).     atorvastatin (LIPITOR) 20 MG tablet Take 20 mg by mouth daily before breakfast.     cetirizine (ZYRTEC ALLERGY) 10 MG tablet Take 1 tablet (10 mg total) by mouth daily. (Patient taking differently: Take 10 mg by mouth every  evening.)     cholestyramine (QUESTRAN) 4 GM/DOSE powder Take 0.75 packets by mouth daily after breakfast.     Cyanocobalamin (B-12 PO) Take by mouth.     dicyclomine (BENTYL) 10 MG capsule Take 10 mg by mouth daily at 6 (six) AM. (1st thing in the morning)     diphenoxylate-atropine (LOMOTIL) 2.5-0.025 MG tablet Take 1 tablet by mouth 4 (four) times daily as needed for diarrhea or loose stools. 60 tablet 10   ferrous sulfate 325 (65 FE) MG tablet Take 1 tablet (325 mg total) by mouth 2 (two) times daily with a meal. 40 tablet 2   fluticasone (FLONASE) 50 MCG/ACT nasal spray Place 1 spray into both nostrils daily as needed.     gabapentin (NEURONTIN) 300 MG capsule Take 1 capsule (300 mg total) by mouth 3 (three) times daily. 60 capsule 3   lisinopril (ZESTRIL) 20 MG tablet Take 20 mg by mouth at bedtime.     Multiple Vitamins-Minerals (MULTIVITAMIN GUMMIES WOMENS PO) Take 1 tablet by mouth every evening. One-A-Day Women's  pill     Omega-3 Fatty Acids (FISH OIL) 1200 MG CAPS Take 1,200 mg by mouth every evening.     Polyethyl Glycol-Propyl Glycol (SYSTANE) 0.4-0.3 % SOLN Place 1 drop into both eyes daily as needed (dry eyes).     traMADol (ULTRAM) 50 MG tablet Take 1-2 tablets (50-100 mg total) by mouth every 6 (six) hours as needed for moderate pain (for pain.). 30 tablet 0   No current facility-administered medications for this visit.    Allergies as of 03/26/2021 - Review Complete 01/20/2021  Allergen Reaction Noted   Fentanyl Other (See Comments) 01/26/2016   Tetracyclines & related Hives 02/26/2011   Other  08/11/2016   Sodium lactate Diarrhea 03/09/2018   Sucrose  12/10/2020   Clams [shellfish allergy] Diarrhea 04/11/2014   Nsaids Nausea And Vomiting 08/11/2016   Pentazocine lactate Nausea And Vomiting 02/26/2011   Tylenol [acetaminophen] Other (See Comments) 08/18/2016    Family History  Problem Relation Age of Onset   Alzheimer's disease Mother    Heart attack Father     Heart disease Father    Multiple sclerosis Brother    Epilepsy Brother    Alzheimer's disease Brother    Hypertension Brother    Heart disease Brother    Hyperlipidemia Brother    Kidney Stones Brother    Breast cancer Maternal Aunt    Hodgkin's lymphoma Son    Heart disease Son    Diabetes Son    Breast cancer Cousin    Breast cancer Cousin    Breast cancer Cousin    Heart disease Other    Colon polyps Neg Hx    Colon cancer Neg Hx  Esophageal cancer Neg Hx    Rectal cancer Neg Hx    Stomach cancer Neg Hx     Social History   Socioeconomic History   Marital status: Married    Spouse name: Not on file   Number of children: 2   Years of education: Not on file   Highest education level: Not on file  Occupational History   Occupation: retired  Tobacco Use   Smoking status: Never   Smokeless tobacco: Never  Vaping Use   Vaping Use: Never used  Substance and Sexual Activity   Alcohol use: Yes    Comment: 1 glass wine at new years only   Drug use: No   Sexual activity: Not on file  Other Topics Concern   Not on file  Social History Narrative   Not on file   Social Determinants of Health   Financial Resource Strain: Not on file  Food Insecurity: Not on file  Transportation Needs: Not on file  Physical Activity: Not on file  Stress: Not on file  Social Connections: Not on file  Intimate Partner Violence: Not on file     Physical Exam: BP (!) 154/50    Pulse 78    Ht 5' 3.75" (1.619 m)    Wt 180 lb (81.6 kg)    SpO2 98%    BMI 31.14 kg/m  Constitutional: generally well-appearing Psychiatric: alert and oriented x3 Abdomen: soft, nontender, nondistended, no obvious ascites, no peritoneal signs, normal bowel sounds No peripheral edema noted in lower extremities  Assessment and plan: 76 y.o. female with history of colon cancer, status post robotic right colectomy 3 months ago  She definitely had some significant bowel changes after her right colectomy, this  was in the setting of chronic IBS-D.  She seems to be getting back to more of her usual bowel habits.  I recommended that he could titrate her cholestyramine upward as needed since she has had her ileocecal valve resected it may be particularly helpful.  I will think she needs any x-ray test or blood work now.  We discussed that she will need a repeat colonoscopy 1 year from her colon surgery date which would be October 2023.  Please see the "Patient Instructions" section for addition details about the plan.  Owens Loffler, MD [facility] 03/26/2021, 3:53 PM   Total time on date of encounter was 20 minutes (this included time spent preparing to see the patient reviewing records; obtaining and/or reviewing separately obtained history; performing a medically appropriate exam and/or evaluation; counseling and educating the patient and family if present; ordering medications, tests or procedures if applicable; and documenting clinical information in the health record).

## 2021-04-01 ENCOUNTER — Ambulatory Visit: Payer: Medicare Other | Admitting: Hematology and Oncology

## 2021-04-01 DIAGNOSIS — K219 Gastro-esophageal reflux disease without esophagitis: Secondary | ICD-10-CM | POA: Diagnosis not present

## 2021-04-01 DIAGNOSIS — I129 Hypertensive chronic kidney disease with stage 1 through stage 4 chronic kidney disease, or unspecified chronic kidney disease: Secondary | ICD-10-CM | POA: Diagnosis not present

## 2021-04-01 DIAGNOSIS — N182 Chronic kidney disease, stage 2 (mild): Secondary | ICD-10-CM | POA: Diagnosis not present

## 2021-04-01 DIAGNOSIS — K760 Fatty (change of) liver, not elsewhere classified: Secondary | ICD-10-CM | POA: Diagnosis not present

## 2021-04-01 DIAGNOSIS — G8918 Other acute postprocedural pain: Secondary | ICD-10-CM | POA: Diagnosis not present

## 2021-04-01 DIAGNOSIS — E7800 Pure hypercholesterolemia, unspecified: Secondary | ICD-10-CM | POA: Diagnosis not present

## 2021-04-03 DIAGNOSIS — F419 Anxiety disorder, unspecified: Secondary | ICD-10-CM | POA: Diagnosis not present

## 2021-04-03 DIAGNOSIS — M150 Primary generalized (osteo)arthritis: Secondary | ICD-10-CM | POA: Diagnosis not present

## 2021-04-03 DIAGNOSIS — G4700 Insomnia, unspecified: Secondary | ICD-10-CM | POA: Diagnosis not present

## 2021-04-03 DIAGNOSIS — R011 Cardiac murmur, unspecified: Secondary | ICD-10-CM | POA: Diagnosis not present

## 2021-04-03 DIAGNOSIS — K580 Irritable bowel syndrome with diarrhea: Secondary | ICD-10-CM | POA: Diagnosis not present

## 2021-04-03 DIAGNOSIS — K219 Gastro-esophageal reflux disease without esophagitis: Secondary | ICD-10-CM | POA: Diagnosis not present

## 2021-04-03 DIAGNOSIS — E7800 Pure hypercholesterolemia, unspecified: Secondary | ICD-10-CM | POA: Diagnosis not present

## 2021-04-03 DIAGNOSIS — Z79891 Long term (current) use of opiate analgesic: Secondary | ICD-10-CM | POA: Diagnosis not present

## 2021-04-03 DIAGNOSIS — J45909 Unspecified asthma, uncomplicated: Secondary | ICD-10-CM | POA: Diagnosis not present

## 2021-04-03 DIAGNOSIS — I129 Hypertensive chronic kidney disease with stage 1 through stage 4 chronic kidney disease, or unspecified chronic kidney disease: Secondary | ICD-10-CM | POA: Diagnosis not present

## 2021-04-03 DIAGNOSIS — Z9181 History of falling: Secondary | ICD-10-CM | POA: Diagnosis not present

## 2021-04-03 DIAGNOSIS — G8918 Other acute postprocedural pain: Secondary | ICD-10-CM | POA: Diagnosis not present

## 2021-04-03 DIAGNOSIS — Z85068 Personal history of other malignant neoplasm of small intestine: Secondary | ICD-10-CM | POA: Diagnosis not present

## 2021-04-03 DIAGNOSIS — N182 Chronic kidney disease, stage 2 (mild): Secondary | ICD-10-CM | POA: Diagnosis not present

## 2021-04-03 DIAGNOSIS — K760 Fatty (change of) liver, not elsewhere classified: Secondary | ICD-10-CM | POA: Diagnosis not present

## 2021-04-03 DIAGNOSIS — R630 Anorexia: Secondary | ICD-10-CM | POA: Diagnosis not present

## 2021-04-03 DIAGNOSIS — Z7952 Long term (current) use of systemic steroids: Secondary | ICD-10-CM | POA: Diagnosis not present

## 2021-04-03 DIAGNOSIS — Z9049 Acquired absence of other specified parts of digestive tract: Secondary | ICD-10-CM | POA: Diagnosis not present

## 2021-04-07 DIAGNOSIS — K580 Irritable bowel syndrome with diarrhea: Secondary | ICD-10-CM | POA: Diagnosis not present

## 2021-04-07 DIAGNOSIS — F419 Anxiety disorder, unspecified: Secondary | ICD-10-CM | POA: Diagnosis not present

## 2021-04-07 DIAGNOSIS — Z85038 Personal history of other malignant neoplasm of large intestine: Secondary | ICD-10-CM | POA: Diagnosis not present

## 2021-04-07 DIAGNOSIS — K219 Gastro-esophageal reflux disease without esophagitis: Secondary | ICD-10-CM | POA: Diagnosis not present

## 2021-04-14 DIAGNOSIS — E7800 Pure hypercholesterolemia, unspecified: Secondary | ICD-10-CM | POA: Diagnosis not present

## 2021-04-14 DIAGNOSIS — K219 Gastro-esophageal reflux disease without esophagitis: Secondary | ICD-10-CM | POA: Diagnosis not present

## 2021-04-14 DIAGNOSIS — G8918 Other acute postprocedural pain: Secondary | ICD-10-CM | POA: Diagnosis not present

## 2021-04-14 DIAGNOSIS — I129 Hypertensive chronic kidney disease with stage 1 through stage 4 chronic kidney disease, or unspecified chronic kidney disease: Secondary | ICD-10-CM | POA: Diagnosis not present

## 2021-04-14 DIAGNOSIS — K760 Fatty (change of) liver, not elsewhere classified: Secondary | ICD-10-CM | POA: Diagnosis not present

## 2021-04-14 DIAGNOSIS — N182 Chronic kidney disease, stage 2 (mild): Secondary | ICD-10-CM | POA: Diagnosis not present

## 2021-04-21 DIAGNOSIS — R39198 Other difficulties with micturition: Secondary | ICD-10-CM | POA: Diagnosis not present

## 2021-04-23 ENCOUNTER — Telehealth: Payer: Self-pay | Admitting: Gastroenterology

## 2021-04-23 DIAGNOSIS — R3911 Hesitancy of micturition: Secondary | ICD-10-CM | POA: Diagnosis not present

## 2021-04-23 NOTE — Telephone Encounter (Signed)
The pt tells me that she was prescribed cipro for UTI recently and she her diarrhea has worsened.  She believes it is the cipro that has caused the problem.  I have asked her to call the MD office that prescribed the abx and let them know what symptoms she is experiencing.  She has an appt today at 10 am and will discuss.  If they feel she need GI she or they will call our office back.

## 2021-04-23 NOTE — Telephone Encounter (Signed)
Inbound call from patient stating that she believes that some of her medications are causing her to have diarrhea and wanted to discuss with a nurse. Please advise.

## 2021-04-28 DIAGNOSIS — K760 Fatty (change of) liver, not elsewhere classified: Secondary | ICD-10-CM | POA: Diagnosis not present

## 2021-04-28 DIAGNOSIS — N182 Chronic kidney disease, stage 2 (mild): Secondary | ICD-10-CM | POA: Diagnosis not present

## 2021-04-28 DIAGNOSIS — K219 Gastro-esophageal reflux disease without esophagitis: Secondary | ICD-10-CM | POA: Diagnosis not present

## 2021-04-28 DIAGNOSIS — I129 Hypertensive chronic kidney disease with stage 1 through stage 4 chronic kidney disease, or unspecified chronic kidney disease: Secondary | ICD-10-CM | POA: Diagnosis not present

## 2021-04-28 DIAGNOSIS — G8918 Other acute postprocedural pain: Secondary | ICD-10-CM | POA: Diagnosis not present

## 2021-04-28 DIAGNOSIS — E7800 Pure hypercholesterolemia, unspecified: Secondary | ICD-10-CM | POA: Diagnosis not present

## 2021-05-03 DIAGNOSIS — Z85068 Personal history of other malignant neoplasm of small intestine: Secondary | ICD-10-CM | POA: Diagnosis not present

## 2021-05-03 DIAGNOSIS — K219 Gastro-esophageal reflux disease without esophagitis: Secondary | ICD-10-CM | POA: Diagnosis not present

## 2021-05-03 DIAGNOSIS — K760 Fatty (change of) liver, not elsewhere classified: Secondary | ICD-10-CM | POA: Diagnosis not present

## 2021-05-03 DIAGNOSIS — E7800 Pure hypercholesterolemia, unspecified: Secondary | ICD-10-CM | POA: Diagnosis not present

## 2021-05-03 DIAGNOSIS — J45909 Unspecified asthma, uncomplicated: Secondary | ICD-10-CM | POA: Diagnosis not present

## 2021-05-03 DIAGNOSIS — N182 Chronic kidney disease, stage 2 (mild): Secondary | ICD-10-CM | POA: Diagnosis not present

## 2021-05-03 DIAGNOSIS — R630 Anorexia: Secondary | ICD-10-CM | POA: Diagnosis not present

## 2021-05-03 DIAGNOSIS — M150 Primary generalized (osteo)arthritis: Secondary | ICD-10-CM | POA: Diagnosis not present

## 2021-05-03 DIAGNOSIS — Z9049 Acquired absence of other specified parts of digestive tract: Secondary | ICD-10-CM | POA: Diagnosis not present

## 2021-05-03 DIAGNOSIS — R011 Cardiac murmur, unspecified: Secondary | ICD-10-CM | POA: Diagnosis not present

## 2021-05-03 DIAGNOSIS — I129 Hypertensive chronic kidney disease with stage 1 through stage 4 chronic kidney disease, or unspecified chronic kidney disease: Secondary | ICD-10-CM | POA: Diagnosis not present

## 2021-05-03 DIAGNOSIS — G4700 Insomnia, unspecified: Secondary | ICD-10-CM | POA: Diagnosis not present

## 2021-05-03 DIAGNOSIS — G8918 Other acute postprocedural pain: Secondary | ICD-10-CM | POA: Diagnosis not present

## 2021-05-03 DIAGNOSIS — K580 Irritable bowel syndrome with diarrhea: Secondary | ICD-10-CM | POA: Diagnosis not present

## 2021-05-03 DIAGNOSIS — Z9181 History of falling: Secondary | ICD-10-CM | POA: Diagnosis not present

## 2021-05-03 DIAGNOSIS — F419 Anxiety disorder, unspecified: Secondary | ICD-10-CM | POA: Diagnosis not present

## 2021-05-03 DIAGNOSIS — Z79891 Long term (current) use of opiate analgesic: Secondary | ICD-10-CM | POA: Diagnosis not present

## 2021-05-04 DIAGNOSIS — G8918 Other acute postprocedural pain: Secondary | ICD-10-CM | POA: Diagnosis not present

## 2021-05-04 DIAGNOSIS — K760 Fatty (change of) liver, not elsewhere classified: Secondary | ICD-10-CM | POA: Diagnosis not present

## 2021-05-04 DIAGNOSIS — N182 Chronic kidney disease, stage 2 (mild): Secondary | ICD-10-CM | POA: Diagnosis not present

## 2021-05-04 DIAGNOSIS — E7800 Pure hypercholesterolemia, unspecified: Secondary | ICD-10-CM | POA: Diagnosis not present

## 2021-05-04 DIAGNOSIS — K219 Gastro-esophageal reflux disease without esophagitis: Secondary | ICD-10-CM | POA: Diagnosis not present

## 2021-05-04 DIAGNOSIS — I129 Hypertensive chronic kidney disease with stage 1 through stage 4 chronic kidney disease, or unspecified chronic kidney disease: Secondary | ICD-10-CM | POA: Diagnosis not present

## 2021-05-05 ENCOUNTER — Ambulatory Visit: Payer: Medicare Other | Admitting: Hematology and Oncology

## 2021-05-06 DIAGNOSIS — K219 Gastro-esophageal reflux disease without esophagitis: Secondary | ICD-10-CM | POA: Diagnosis not present

## 2021-05-06 DIAGNOSIS — Z85038 Personal history of other malignant neoplasm of large intestine: Secondary | ICD-10-CM | POA: Diagnosis not present

## 2021-05-06 DIAGNOSIS — F419 Anxiety disorder, unspecified: Secondary | ICD-10-CM | POA: Diagnosis not present

## 2021-05-06 DIAGNOSIS — K580 Irritable bowel syndrome with diarrhea: Secondary | ICD-10-CM | POA: Diagnosis not present

## 2021-05-14 DIAGNOSIS — G8918 Other acute postprocedural pain: Secondary | ICD-10-CM | POA: Diagnosis not present

## 2021-05-14 DIAGNOSIS — K219 Gastro-esophageal reflux disease without esophagitis: Secondary | ICD-10-CM | POA: Diagnosis not present

## 2021-05-14 DIAGNOSIS — N182 Chronic kidney disease, stage 2 (mild): Secondary | ICD-10-CM | POA: Diagnosis not present

## 2021-05-14 DIAGNOSIS — I129 Hypertensive chronic kidney disease with stage 1 through stage 4 chronic kidney disease, or unspecified chronic kidney disease: Secondary | ICD-10-CM | POA: Diagnosis not present

## 2021-05-14 DIAGNOSIS — K760 Fatty (change of) liver, not elsewhere classified: Secondary | ICD-10-CM | POA: Diagnosis not present

## 2021-05-14 DIAGNOSIS — E7800 Pure hypercholesterolemia, unspecified: Secondary | ICD-10-CM | POA: Diagnosis not present

## 2021-05-21 DIAGNOSIS — K760 Fatty (change of) liver, not elsewhere classified: Secondary | ICD-10-CM | POA: Diagnosis not present

## 2021-05-21 DIAGNOSIS — K219 Gastro-esophageal reflux disease without esophagitis: Secondary | ICD-10-CM | POA: Diagnosis not present

## 2021-05-21 DIAGNOSIS — G8918 Other acute postprocedural pain: Secondary | ICD-10-CM | POA: Diagnosis not present

## 2021-05-21 DIAGNOSIS — I129 Hypertensive chronic kidney disease with stage 1 through stage 4 chronic kidney disease, or unspecified chronic kidney disease: Secondary | ICD-10-CM | POA: Diagnosis not present

## 2021-05-21 DIAGNOSIS — N182 Chronic kidney disease, stage 2 (mild): Secondary | ICD-10-CM | POA: Diagnosis not present

## 2021-05-21 DIAGNOSIS — E7800 Pure hypercholesterolemia, unspecified: Secondary | ICD-10-CM | POA: Diagnosis not present

## 2021-05-25 DIAGNOSIS — E7800 Pure hypercholesterolemia, unspecified: Secondary | ICD-10-CM | POA: Diagnosis not present

## 2021-05-25 DIAGNOSIS — K219 Gastro-esophageal reflux disease without esophagitis: Secondary | ICD-10-CM | POA: Diagnosis not present

## 2021-05-25 DIAGNOSIS — N182 Chronic kidney disease, stage 2 (mild): Secondary | ICD-10-CM | POA: Diagnosis not present

## 2021-05-25 DIAGNOSIS — K760 Fatty (change of) liver, not elsewhere classified: Secondary | ICD-10-CM | POA: Diagnosis not present

## 2021-05-25 DIAGNOSIS — G8918 Other acute postprocedural pain: Secondary | ICD-10-CM | POA: Diagnosis not present

## 2021-05-25 DIAGNOSIS — I129 Hypertensive chronic kidney disease with stage 1 through stage 4 chronic kidney disease, or unspecified chronic kidney disease: Secondary | ICD-10-CM | POA: Diagnosis not present

## 2021-06-02 DIAGNOSIS — Z9181 History of falling: Secondary | ICD-10-CM | POA: Diagnosis not present

## 2021-06-02 DIAGNOSIS — N182 Chronic kidney disease, stage 2 (mild): Secondary | ICD-10-CM | POA: Diagnosis not present

## 2021-06-02 DIAGNOSIS — K760 Fatty (change of) liver, not elsewhere classified: Secondary | ICD-10-CM | POA: Diagnosis not present

## 2021-06-02 DIAGNOSIS — E7800 Pure hypercholesterolemia, unspecified: Secondary | ICD-10-CM | POA: Diagnosis not present

## 2021-06-02 DIAGNOSIS — Z9049 Acquired absence of other specified parts of digestive tract: Secondary | ICD-10-CM | POA: Diagnosis not present

## 2021-06-02 DIAGNOSIS — K219 Gastro-esophageal reflux disease without esophagitis: Secondary | ICD-10-CM | POA: Diagnosis not present

## 2021-06-02 DIAGNOSIS — K580 Irritable bowel syndrome with diarrhea: Secondary | ICD-10-CM | POA: Diagnosis not present

## 2021-06-02 DIAGNOSIS — Z79891 Long term (current) use of opiate analgesic: Secondary | ICD-10-CM | POA: Diagnosis not present

## 2021-06-02 DIAGNOSIS — G8918 Other acute postprocedural pain: Secondary | ICD-10-CM | POA: Diagnosis not present

## 2021-06-02 DIAGNOSIS — F419 Anxiety disorder, unspecified: Secondary | ICD-10-CM | POA: Diagnosis not present

## 2021-06-02 DIAGNOSIS — M150 Primary generalized (osteo)arthritis: Secondary | ICD-10-CM | POA: Diagnosis not present

## 2021-06-02 DIAGNOSIS — J45909 Unspecified asthma, uncomplicated: Secondary | ICD-10-CM | POA: Diagnosis not present

## 2021-06-02 DIAGNOSIS — R630 Anorexia: Secondary | ICD-10-CM | POA: Diagnosis not present

## 2021-06-02 DIAGNOSIS — C180 Malignant neoplasm of cecum: Secondary | ICD-10-CM | POA: Diagnosis not present

## 2021-06-02 DIAGNOSIS — I129 Hypertensive chronic kidney disease with stage 1 through stage 4 chronic kidney disease, or unspecified chronic kidney disease: Secondary | ICD-10-CM | POA: Diagnosis not present

## 2021-06-02 DIAGNOSIS — G4700 Insomnia, unspecified: Secondary | ICD-10-CM | POA: Diagnosis not present

## 2021-06-02 DIAGNOSIS — R011 Cardiac murmur, unspecified: Secondary | ICD-10-CM | POA: Diagnosis not present

## 2021-06-02 DIAGNOSIS — Z85068 Personal history of other malignant neoplasm of small intestine: Secondary | ICD-10-CM | POA: Diagnosis not present

## 2021-06-02 DIAGNOSIS — M6281 Muscle weakness (generalized): Secondary | ICD-10-CM | POA: Diagnosis not present

## 2021-06-04 DIAGNOSIS — J45909 Unspecified asthma, uncomplicated: Secondary | ICD-10-CM | POA: Diagnosis not present

## 2021-06-04 DIAGNOSIS — Z85038 Personal history of other malignant neoplasm of large intestine: Secondary | ICD-10-CM | POA: Diagnosis not present

## 2021-06-04 DIAGNOSIS — C180 Malignant neoplasm of cecum: Secondary | ICD-10-CM | POA: Diagnosis not present

## 2021-06-04 DIAGNOSIS — K760 Fatty (change of) liver, not elsewhere classified: Secondary | ICD-10-CM | POA: Diagnosis not present

## 2021-06-04 DIAGNOSIS — E785 Hyperlipidemia, unspecified: Secondary | ICD-10-CM | POA: Diagnosis not present

## 2021-06-04 DIAGNOSIS — K219 Gastro-esophageal reflux disease without esophagitis: Secondary | ICD-10-CM | POA: Diagnosis not present

## 2021-06-04 DIAGNOSIS — G8918 Other acute postprocedural pain: Secondary | ICD-10-CM | POA: Diagnosis not present

## 2021-06-04 DIAGNOSIS — I129 Hypertensive chronic kidney disease with stage 1 through stage 4 chronic kidney disease, or unspecified chronic kidney disease: Secondary | ICD-10-CM | POA: Diagnosis not present

## 2021-06-04 DIAGNOSIS — D0512 Intraductal carcinoma in situ of left breast: Secondary | ICD-10-CM | POA: Diagnosis not present

## 2021-06-04 DIAGNOSIS — E7800 Pure hypercholesterolemia, unspecified: Secondary | ICD-10-CM | POA: Diagnosis not present

## 2021-06-04 DIAGNOSIS — N182 Chronic kidney disease, stage 2 (mild): Secondary | ICD-10-CM | POA: Diagnosis not present

## 2021-06-11 DIAGNOSIS — I129 Hypertensive chronic kidney disease with stage 1 through stage 4 chronic kidney disease, or unspecified chronic kidney disease: Secondary | ICD-10-CM | POA: Diagnosis not present

## 2021-06-11 DIAGNOSIS — G8918 Other acute postprocedural pain: Secondary | ICD-10-CM | POA: Diagnosis not present

## 2021-06-11 DIAGNOSIS — K760 Fatty (change of) liver, not elsewhere classified: Secondary | ICD-10-CM | POA: Diagnosis not present

## 2021-06-11 DIAGNOSIS — E7800 Pure hypercholesterolemia, unspecified: Secondary | ICD-10-CM | POA: Diagnosis not present

## 2021-06-11 DIAGNOSIS — N182 Chronic kidney disease, stage 2 (mild): Secondary | ICD-10-CM | POA: Diagnosis not present

## 2021-06-11 DIAGNOSIS — K219 Gastro-esophageal reflux disease without esophagitis: Secondary | ICD-10-CM | POA: Diagnosis not present

## 2021-06-17 DIAGNOSIS — K219 Gastro-esophageal reflux disease without esophagitis: Secondary | ICD-10-CM | POA: Diagnosis not present

## 2021-06-17 DIAGNOSIS — E7800 Pure hypercholesterolemia, unspecified: Secondary | ICD-10-CM | POA: Diagnosis not present

## 2021-06-17 DIAGNOSIS — I129 Hypertensive chronic kidney disease with stage 1 through stage 4 chronic kidney disease, or unspecified chronic kidney disease: Secondary | ICD-10-CM | POA: Diagnosis not present

## 2021-06-17 DIAGNOSIS — K760 Fatty (change of) liver, not elsewhere classified: Secondary | ICD-10-CM | POA: Diagnosis not present

## 2021-06-17 DIAGNOSIS — G8918 Other acute postprocedural pain: Secondary | ICD-10-CM | POA: Diagnosis not present

## 2021-06-17 DIAGNOSIS — N182 Chronic kidney disease, stage 2 (mild): Secondary | ICD-10-CM | POA: Diagnosis not present

## 2021-06-24 DIAGNOSIS — K219 Gastro-esophageal reflux disease without esophagitis: Secondary | ICD-10-CM | POA: Diagnosis not present

## 2021-06-24 DIAGNOSIS — E7800 Pure hypercholesterolemia, unspecified: Secondary | ICD-10-CM | POA: Diagnosis not present

## 2021-06-24 DIAGNOSIS — G8918 Other acute postprocedural pain: Secondary | ICD-10-CM | POA: Diagnosis not present

## 2021-06-24 DIAGNOSIS — N182 Chronic kidney disease, stage 2 (mild): Secondary | ICD-10-CM | POA: Diagnosis not present

## 2021-06-24 DIAGNOSIS — K760 Fatty (change of) liver, not elsewhere classified: Secondary | ICD-10-CM | POA: Diagnosis not present

## 2021-06-24 DIAGNOSIS — I129 Hypertensive chronic kidney disease with stage 1 through stage 4 chronic kidney disease, or unspecified chronic kidney disease: Secondary | ICD-10-CM | POA: Diagnosis not present

## 2021-06-30 DIAGNOSIS — M6281 Muscle weakness (generalized): Secondary | ICD-10-CM | POA: Diagnosis not present

## 2021-06-30 DIAGNOSIS — K219 Gastro-esophageal reflux disease without esophagitis: Secondary | ICD-10-CM | POA: Diagnosis not present

## 2021-06-30 DIAGNOSIS — K580 Irritable bowel syndrome with diarrhea: Secondary | ICD-10-CM | POA: Diagnosis not present

## 2021-06-30 DIAGNOSIS — Z7189 Other specified counseling: Secondary | ICD-10-CM | POA: Diagnosis not present

## 2021-06-30 DIAGNOSIS — F419 Anxiety disorder, unspecified: Secondary | ICD-10-CM | POA: Diagnosis not present

## 2021-06-30 DIAGNOSIS — C180 Malignant neoplasm of cecum: Secondary | ICD-10-CM | POA: Diagnosis not present

## 2021-07-01 DIAGNOSIS — K219 Gastro-esophageal reflux disease without esophagitis: Secondary | ICD-10-CM | POA: Diagnosis not present

## 2021-07-01 DIAGNOSIS — K760 Fatty (change of) liver, not elsewhere classified: Secondary | ICD-10-CM | POA: Diagnosis not present

## 2021-07-01 DIAGNOSIS — G8918 Other acute postprocedural pain: Secondary | ICD-10-CM | POA: Diagnosis not present

## 2021-07-01 DIAGNOSIS — E7800 Pure hypercholesterolemia, unspecified: Secondary | ICD-10-CM | POA: Diagnosis not present

## 2021-07-01 DIAGNOSIS — I129 Hypertensive chronic kidney disease with stage 1 through stage 4 chronic kidney disease, or unspecified chronic kidney disease: Secondary | ICD-10-CM | POA: Diagnosis not present

## 2021-07-01 DIAGNOSIS — N182 Chronic kidney disease, stage 2 (mild): Secondary | ICD-10-CM | POA: Diagnosis not present

## 2021-07-02 DIAGNOSIS — Z9181 History of falling: Secondary | ICD-10-CM | POA: Diagnosis not present

## 2021-07-02 DIAGNOSIS — G4700 Insomnia, unspecified: Secondary | ICD-10-CM | POA: Diagnosis not present

## 2021-07-02 DIAGNOSIS — I129 Hypertensive chronic kidney disease with stage 1 through stage 4 chronic kidney disease, or unspecified chronic kidney disease: Secondary | ICD-10-CM | POA: Diagnosis not present

## 2021-07-02 DIAGNOSIS — K580 Irritable bowel syndrome with diarrhea: Secondary | ICD-10-CM | POA: Diagnosis not present

## 2021-07-02 DIAGNOSIS — Z79891 Long term (current) use of opiate analgesic: Secondary | ICD-10-CM | POA: Diagnosis not present

## 2021-07-02 DIAGNOSIS — N182 Chronic kidney disease, stage 2 (mild): Secondary | ICD-10-CM | POA: Diagnosis not present

## 2021-07-02 DIAGNOSIS — G8918 Other acute postprocedural pain: Secondary | ICD-10-CM | POA: Diagnosis not present

## 2021-07-02 DIAGNOSIS — K760 Fatty (change of) liver, not elsewhere classified: Secondary | ICD-10-CM | POA: Diagnosis not present

## 2021-07-02 DIAGNOSIS — M150 Primary generalized (osteo)arthritis: Secondary | ICD-10-CM | POA: Diagnosis not present

## 2021-07-02 DIAGNOSIS — Z85068 Personal history of other malignant neoplasm of small intestine: Secondary | ICD-10-CM | POA: Diagnosis not present

## 2021-07-02 DIAGNOSIS — E7800 Pure hypercholesterolemia, unspecified: Secondary | ICD-10-CM | POA: Diagnosis not present

## 2021-07-02 DIAGNOSIS — R011 Cardiac murmur, unspecified: Secondary | ICD-10-CM | POA: Diagnosis not present

## 2021-07-02 DIAGNOSIS — R630 Anorexia: Secondary | ICD-10-CM | POA: Diagnosis not present

## 2021-07-02 DIAGNOSIS — K219 Gastro-esophageal reflux disease without esophagitis: Secondary | ICD-10-CM | POA: Diagnosis not present

## 2021-07-02 DIAGNOSIS — Z9049 Acquired absence of other specified parts of digestive tract: Secondary | ICD-10-CM | POA: Diagnosis not present

## 2021-07-02 DIAGNOSIS — J45909 Unspecified asthma, uncomplicated: Secondary | ICD-10-CM | POA: Diagnosis not present

## 2021-07-02 DIAGNOSIS — F419 Anxiety disorder, unspecified: Secondary | ICD-10-CM | POA: Diagnosis not present

## 2021-07-04 DIAGNOSIS — E785 Hyperlipidemia, unspecified: Secondary | ICD-10-CM | POA: Diagnosis not present

## 2021-07-04 DIAGNOSIS — I10 Essential (primary) hypertension: Secondary | ICD-10-CM | POA: Diagnosis not present

## 2021-07-08 DIAGNOSIS — E7800 Pure hypercholesterolemia, unspecified: Secondary | ICD-10-CM | POA: Diagnosis not present

## 2021-07-08 DIAGNOSIS — I129 Hypertensive chronic kidney disease with stage 1 through stage 4 chronic kidney disease, or unspecified chronic kidney disease: Secondary | ICD-10-CM | POA: Diagnosis not present

## 2021-07-08 DIAGNOSIS — N182 Chronic kidney disease, stage 2 (mild): Secondary | ICD-10-CM | POA: Diagnosis not present

## 2021-07-08 DIAGNOSIS — K219 Gastro-esophageal reflux disease without esophagitis: Secondary | ICD-10-CM | POA: Diagnosis not present

## 2021-07-08 DIAGNOSIS — G8918 Other acute postprocedural pain: Secondary | ICD-10-CM | POA: Diagnosis not present

## 2021-07-08 DIAGNOSIS — K760 Fatty (change of) liver, not elsewhere classified: Secondary | ICD-10-CM | POA: Diagnosis not present

## 2021-07-14 DIAGNOSIS — I129 Hypertensive chronic kidney disease with stage 1 through stage 4 chronic kidney disease, or unspecified chronic kidney disease: Secondary | ICD-10-CM | POA: Diagnosis not present

## 2021-07-14 DIAGNOSIS — K760 Fatty (change of) liver, not elsewhere classified: Secondary | ICD-10-CM | POA: Diagnosis not present

## 2021-07-14 DIAGNOSIS — K219 Gastro-esophageal reflux disease without esophagitis: Secondary | ICD-10-CM | POA: Diagnosis not present

## 2021-07-14 DIAGNOSIS — G8918 Other acute postprocedural pain: Secondary | ICD-10-CM | POA: Diagnosis not present

## 2021-07-14 DIAGNOSIS — E7800 Pure hypercholesterolemia, unspecified: Secondary | ICD-10-CM | POA: Diagnosis not present

## 2021-07-14 DIAGNOSIS — N182 Chronic kidney disease, stage 2 (mild): Secondary | ICD-10-CM | POA: Diagnosis not present

## 2021-07-15 DIAGNOSIS — R300 Dysuria: Secondary | ICD-10-CM | POA: Diagnosis not present

## 2021-07-15 DIAGNOSIS — I129 Hypertensive chronic kidney disease with stage 1 through stage 4 chronic kidney disease, or unspecified chronic kidney disease: Secondary | ICD-10-CM | POA: Diagnosis not present

## 2021-07-15 DIAGNOSIS — Z87440 Personal history of urinary (tract) infections: Secondary | ICD-10-CM | POA: Diagnosis not present

## 2021-07-15 DIAGNOSIS — J45909 Unspecified asthma, uncomplicated: Secondary | ICD-10-CM | POA: Diagnosis not present

## 2021-07-15 DIAGNOSIS — E785 Hyperlipidemia, unspecified: Secondary | ICD-10-CM | POA: Diagnosis not present

## 2021-07-15 DIAGNOSIS — Z85038 Personal history of other malignant neoplasm of large intestine: Secondary | ICD-10-CM | POA: Diagnosis not present

## 2021-07-15 DIAGNOSIS — N182 Chronic kidney disease, stage 2 (mild): Secondary | ICD-10-CM | POA: Diagnosis not present

## 2021-07-15 DIAGNOSIS — D0512 Intraductal carcinoma in situ of left breast: Secondary | ICD-10-CM | POA: Diagnosis not present

## 2021-07-15 DIAGNOSIS — C180 Malignant neoplasm of cecum: Secondary | ICD-10-CM | POA: Diagnosis not present

## 2021-07-20 ENCOUNTER — Other Ambulatory Visit: Payer: Self-pay | Admitting: Hematology and Oncology

## 2021-07-20 DIAGNOSIS — G8918 Other acute postprocedural pain: Secondary | ICD-10-CM | POA: Diagnosis not present

## 2021-07-20 DIAGNOSIS — E7800 Pure hypercholesterolemia, unspecified: Secondary | ICD-10-CM | POA: Diagnosis not present

## 2021-07-20 DIAGNOSIS — I129 Hypertensive chronic kidney disease with stage 1 through stage 4 chronic kidney disease, or unspecified chronic kidney disease: Secondary | ICD-10-CM | POA: Diagnosis not present

## 2021-07-20 DIAGNOSIS — Z1231 Encounter for screening mammogram for malignant neoplasm of breast: Secondary | ICD-10-CM

## 2021-07-20 DIAGNOSIS — K760 Fatty (change of) liver, not elsewhere classified: Secondary | ICD-10-CM | POA: Diagnosis not present

## 2021-07-20 DIAGNOSIS — N182 Chronic kidney disease, stage 2 (mild): Secondary | ICD-10-CM | POA: Diagnosis not present

## 2021-07-20 DIAGNOSIS — K219 Gastro-esophageal reflux disease without esophagitis: Secondary | ICD-10-CM | POA: Diagnosis not present

## 2021-07-27 DIAGNOSIS — I129 Hypertensive chronic kidney disease with stage 1 through stage 4 chronic kidney disease, or unspecified chronic kidney disease: Secondary | ICD-10-CM | POA: Diagnosis not present

## 2021-07-27 DIAGNOSIS — K760 Fatty (change of) liver, not elsewhere classified: Secondary | ICD-10-CM | POA: Diagnosis not present

## 2021-07-27 DIAGNOSIS — G8918 Other acute postprocedural pain: Secondary | ICD-10-CM | POA: Diagnosis not present

## 2021-07-27 DIAGNOSIS — E7800 Pure hypercholesterolemia, unspecified: Secondary | ICD-10-CM | POA: Diagnosis not present

## 2021-07-27 DIAGNOSIS — K219 Gastro-esophageal reflux disease without esophagitis: Secondary | ICD-10-CM | POA: Diagnosis not present

## 2021-07-27 DIAGNOSIS — N182 Chronic kidney disease, stage 2 (mild): Secondary | ICD-10-CM | POA: Diagnosis not present

## 2021-07-29 DIAGNOSIS — M6281 Muscle weakness (generalized): Secondary | ICD-10-CM | POA: Diagnosis not present

## 2021-07-29 DIAGNOSIS — I10 Essential (primary) hypertension: Secondary | ICD-10-CM | POA: Diagnosis not present

## 2021-07-29 DIAGNOSIS — K219 Gastro-esophageal reflux disease without esophagitis: Secondary | ICD-10-CM | POA: Diagnosis not present

## 2021-07-29 DIAGNOSIS — Z85038 Personal history of other malignant neoplasm of large intestine: Secondary | ICD-10-CM | POA: Diagnosis not present

## 2021-07-29 DIAGNOSIS — E785 Hyperlipidemia, unspecified: Secondary | ICD-10-CM | POA: Diagnosis not present

## 2021-07-29 DIAGNOSIS — K580 Irritable bowel syndrome with diarrhea: Secondary | ICD-10-CM | POA: Diagnosis not present

## 2021-08-01 DIAGNOSIS — N182 Chronic kidney disease, stage 2 (mild): Secondary | ICD-10-CM | POA: Diagnosis not present

## 2021-08-01 DIAGNOSIS — K580 Irritable bowel syndrome with diarrhea: Secondary | ICD-10-CM | POA: Diagnosis not present

## 2021-08-01 DIAGNOSIS — Z9049 Acquired absence of other specified parts of digestive tract: Secondary | ICD-10-CM | POA: Diagnosis not present

## 2021-08-01 DIAGNOSIS — K760 Fatty (change of) liver, not elsewhere classified: Secondary | ICD-10-CM | POA: Diagnosis not present

## 2021-08-01 DIAGNOSIS — R011 Cardiac murmur, unspecified: Secondary | ICD-10-CM | POA: Diagnosis not present

## 2021-08-01 DIAGNOSIS — G4700 Insomnia, unspecified: Secondary | ICD-10-CM | POA: Diagnosis not present

## 2021-08-01 DIAGNOSIS — F419 Anxiety disorder, unspecified: Secondary | ICD-10-CM | POA: Diagnosis not present

## 2021-08-01 DIAGNOSIS — E7800 Pure hypercholesterolemia, unspecified: Secondary | ICD-10-CM | POA: Diagnosis not present

## 2021-08-01 DIAGNOSIS — J45909 Unspecified asthma, uncomplicated: Secondary | ICD-10-CM | POA: Diagnosis not present

## 2021-08-01 DIAGNOSIS — Z79891 Long term (current) use of opiate analgesic: Secondary | ICD-10-CM | POA: Diagnosis not present

## 2021-08-01 DIAGNOSIS — R630 Anorexia: Secondary | ICD-10-CM | POA: Diagnosis not present

## 2021-08-01 DIAGNOSIS — G8918 Other acute postprocedural pain: Secondary | ICD-10-CM | POA: Diagnosis not present

## 2021-08-01 DIAGNOSIS — I129 Hypertensive chronic kidney disease with stage 1 through stage 4 chronic kidney disease, or unspecified chronic kidney disease: Secondary | ICD-10-CM | POA: Diagnosis not present

## 2021-08-01 DIAGNOSIS — K219 Gastro-esophageal reflux disease without esophagitis: Secondary | ICD-10-CM | POA: Diagnosis not present

## 2021-08-01 DIAGNOSIS — Z9181 History of falling: Secondary | ICD-10-CM | POA: Diagnosis not present

## 2021-08-01 DIAGNOSIS — Z85068 Personal history of other malignant neoplasm of small intestine: Secondary | ICD-10-CM | POA: Diagnosis not present

## 2021-08-01 DIAGNOSIS — M150 Primary generalized (osteo)arthritis: Secondary | ICD-10-CM | POA: Diagnosis not present

## 2021-08-05 DIAGNOSIS — M8580 Other specified disorders of bone density and structure, unspecified site: Secondary | ICD-10-CM | POA: Diagnosis not present

## 2021-08-05 DIAGNOSIS — N182 Chronic kidney disease, stage 2 (mild): Secondary | ICD-10-CM | POA: Diagnosis not present

## 2021-08-05 DIAGNOSIS — I129 Hypertensive chronic kidney disease with stage 1 through stage 4 chronic kidney disease, or unspecified chronic kidney disease: Secondary | ICD-10-CM | POA: Diagnosis not present

## 2021-08-05 DIAGNOSIS — G8918 Other acute postprocedural pain: Secondary | ICD-10-CM | POA: Diagnosis not present

## 2021-08-05 DIAGNOSIS — K219 Gastro-esophageal reflux disease without esophagitis: Secondary | ICD-10-CM | POA: Diagnosis not present

## 2021-08-05 DIAGNOSIS — E785 Hyperlipidemia, unspecified: Secondary | ICD-10-CM | POA: Diagnosis not present

## 2021-08-05 DIAGNOSIS — I10 Essential (primary) hypertension: Secondary | ICD-10-CM | POA: Diagnosis not present

## 2021-08-05 DIAGNOSIS — E7800 Pure hypercholesterolemia, unspecified: Secondary | ICD-10-CM | POA: Diagnosis not present

## 2021-08-05 DIAGNOSIS — K760 Fatty (change of) liver, not elsewhere classified: Secondary | ICD-10-CM | POA: Diagnosis not present

## 2021-08-10 DIAGNOSIS — G8918 Other acute postprocedural pain: Secondary | ICD-10-CM | POA: Diagnosis not present

## 2021-08-10 DIAGNOSIS — K219 Gastro-esophageal reflux disease without esophagitis: Secondary | ICD-10-CM | POA: Diagnosis not present

## 2021-08-10 DIAGNOSIS — K760 Fatty (change of) liver, not elsewhere classified: Secondary | ICD-10-CM | POA: Diagnosis not present

## 2021-08-10 DIAGNOSIS — I129 Hypertensive chronic kidney disease with stage 1 through stage 4 chronic kidney disease, or unspecified chronic kidney disease: Secondary | ICD-10-CM | POA: Diagnosis not present

## 2021-08-10 DIAGNOSIS — N182 Chronic kidney disease, stage 2 (mild): Secondary | ICD-10-CM | POA: Diagnosis not present

## 2021-08-10 DIAGNOSIS — E7800 Pure hypercholesterolemia, unspecified: Secondary | ICD-10-CM | POA: Diagnosis not present

## 2021-08-11 DIAGNOSIS — F419 Anxiety disorder, unspecified: Secondary | ICD-10-CM | POA: Diagnosis not present

## 2021-08-11 DIAGNOSIS — L299 Pruritus, unspecified: Secondary | ICD-10-CM | POA: Diagnosis not present

## 2021-08-11 DIAGNOSIS — I10 Essential (primary) hypertension: Secondary | ICD-10-CM | POA: Diagnosis not present

## 2021-08-11 DIAGNOSIS — C50312 Malignant neoplasm of lower-inner quadrant of left female breast: Secondary | ICD-10-CM | POA: Diagnosis not present

## 2021-08-11 DIAGNOSIS — Z1339 Encounter for screening examination for other mental health and behavioral disorders: Secondary | ICD-10-CM | POA: Diagnosis not present

## 2021-08-11 DIAGNOSIS — R42 Dizziness and giddiness: Secondary | ICD-10-CM | POA: Diagnosis not present

## 2021-08-11 DIAGNOSIS — N939 Abnormal uterine and vaginal bleeding, unspecified: Secondary | ICD-10-CM | POA: Diagnosis not present

## 2021-08-11 DIAGNOSIS — E785 Hyperlipidemia, unspecified: Secondary | ICD-10-CM | POA: Diagnosis not present

## 2021-08-11 DIAGNOSIS — Z0000 Encounter for general adult medical examination without abnormal findings: Secondary | ICD-10-CM | POA: Diagnosis not present

## 2021-08-11 DIAGNOSIS — R399 Unspecified symptoms and signs involving the genitourinary system: Secondary | ICD-10-CM | POA: Diagnosis not present

## 2021-08-11 DIAGNOSIS — Z1331 Encounter for screening for depression: Secondary | ICD-10-CM | POA: Diagnosis not present

## 2021-08-11 DIAGNOSIS — Z171 Estrogen receptor negative status [ER-]: Secondary | ICD-10-CM | POA: Diagnosis not present

## 2021-08-11 DIAGNOSIS — C189 Malignant neoplasm of colon, unspecified: Secondary | ICD-10-CM | POA: Diagnosis not present

## 2021-08-24 DIAGNOSIS — F419 Anxiety disorder, unspecified: Secondary | ICD-10-CM | POA: Diagnosis not present

## 2021-08-24 DIAGNOSIS — I8390 Asymptomatic varicose veins of unspecified lower extremity: Secondary | ICD-10-CM | POA: Diagnosis not present

## 2021-08-24 DIAGNOSIS — K580 Irritable bowel syndrome with diarrhea: Secondary | ICD-10-CM | POA: Diagnosis not present

## 2021-08-24 DIAGNOSIS — C180 Malignant neoplasm of cecum: Secondary | ICD-10-CM | POA: Diagnosis not present

## 2021-08-24 DIAGNOSIS — M6281 Muscle weakness (generalized): Secondary | ICD-10-CM | POA: Diagnosis not present

## 2021-08-24 DIAGNOSIS — K219 Gastro-esophageal reflux disease without esophagitis: Secondary | ICD-10-CM | POA: Diagnosis not present

## 2021-09-03 ENCOUNTER — Ambulatory Visit
Admission: RE | Admit: 2021-09-03 | Discharge: 2021-09-03 | Disposition: A | Discharge status: Home / Self Care | Payer: Medicare Other | Source: Ambulatory Visit | Attending: Hematology and Oncology | Admitting: Hematology and Oncology

## 2021-09-03 DIAGNOSIS — Z1231 Encounter for screening mammogram for malignant neoplasm of breast: Secondary | ICD-10-CM

## 2021-09-21 DIAGNOSIS — I10 Essential (primary) hypertension: Secondary | ICD-10-CM | POA: Diagnosis not present

## 2021-09-21 DIAGNOSIS — E785 Hyperlipidemia, unspecified: Secondary | ICD-10-CM | POA: Diagnosis not present

## 2021-09-21 DIAGNOSIS — K580 Irritable bowel syndrome with diarrhea: Secondary | ICD-10-CM | POA: Diagnosis not present

## 2021-09-21 DIAGNOSIS — R54 Age-related physical debility: Secondary | ICD-10-CM | POA: Diagnosis not present

## 2021-09-21 DIAGNOSIS — F419 Anxiety disorder, unspecified: Secondary | ICD-10-CM | POA: Diagnosis not present

## 2021-09-22 DIAGNOSIS — E785 Hyperlipidemia, unspecified: Secondary | ICD-10-CM | POA: Diagnosis not present

## 2021-09-22 DIAGNOSIS — C180 Malignant neoplasm of cecum: Secondary | ICD-10-CM | POA: Diagnosis not present

## 2021-09-22 DIAGNOSIS — J45909 Unspecified asthma, uncomplicated: Secondary | ICD-10-CM | POA: Diagnosis not present

## 2021-09-22 DIAGNOSIS — I129 Hypertensive chronic kidney disease with stage 1 through stage 4 chronic kidney disease, or unspecified chronic kidney disease: Secondary | ICD-10-CM | POA: Diagnosis not present

## 2021-09-22 DIAGNOSIS — D0512 Intraductal carcinoma in situ of left breast: Secondary | ICD-10-CM | POA: Diagnosis not present

## 2021-09-22 DIAGNOSIS — N182 Chronic kidney disease, stage 2 (mild): Secondary | ICD-10-CM | POA: Diagnosis not present

## 2021-09-27 ENCOUNTER — Encounter: Payer: Self-pay | Admitting: Gastroenterology

## 2021-12-13 ENCOUNTER — Ambulatory Visit: Payer: Medicare Other | Admitting: Gastroenterology

## 2021-12-27 DIAGNOSIS — C189 Malignant neoplasm of colon, unspecified: Secondary | ICD-10-CM | POA: Diagnosis not present

## 2021-12-27 DIAGNOSIS — B372 Candidiasis of skin and nail: Secondary | ICD-10-CM | POA: Diagnosis not present

## 2021-12-27 DIAGNOSIS — C50919 Malignant neoplasm of unspecified site of unspecified female breast: Secondary | ICD-10-CM | POA: Diagnosis not present

## 2021-12-27 DIAGNOSIS — Z01419 Encounter for gynecological examination (general) (routine) without abnormal findings: Secondary | ICD-10-CM | POA: Diagnosis not present

## 2022-01-19 ENCOUNTER — Ambulatory Visit (INDEPENDENT_AMBULATORY_CARE_PROVIDER_SITE_OTHER): Payer: Medicare Other | Admitting: Gastroenterology

## 2022-01-19 ENCOUNTER — Encounter: Payer: Self-pay | Admitting: Gastroenterology

## 2022-01-19 VITALS — BP 130/74 | HR 68 | Ht 63.0 in | Wt 198.6 lb

## 2022-01-19 DIAGNOSIS — Z85038 Personal history of other malignant neoplasm of large intestine: Secondary | ICD-10-CM

## 2022-01-19 DIAGNOSIS — K529 Noninfective gastroenteritis and colitis, unspecified: Secondary | ICD-10-CM | POA: Diagnosis not present

## 2022-01-19 DIAGNOSIS — K580 Irritable bowel syndrome with diarrhea: Secondary | ICD-10-CM | POA: Diagnosis not present

## 2022-01-19 DIAGNOSIS — Z9049 Acquired absence of other specified parts of digestive tract: Secondary | ICD-10-CM | POA: Diagnosis not present

## 2022-01-19 DIAGNOSIS — K9089 Other intestinal malabsorption: Secondary | ICD-10-CM | POA: Diagnosis not present

## 2022-01-19 MED ORDER — DIPHENOXYLATE-ATROPINE 2.5-0.025 MG PO TABS: 1.0000 | ORAL_TABLET | Freq: Four times a day (QID) | ORAL | 2 refills | Status: DC | PRN

## 2022-01-19 MED ORDER — CHOLESTYRAMINE 4 GM/DOSE PO POWD: 4.0000 g | Freq: Two times a day (BID) | ORAL | 1 refills | Status: DC

## 2022-01-19 NOTE — Patient Instructions (Signed)
Your provider has requested that you go to the basement level for lab work before leaving today. Press "B" on the elevator. The lab is located at the first door on the left as you exit the elevator.  You have been scheduled for a colonoscopy. Please follow written instructions given to you at your visit today.  Please pick up your prep supplies at the pharmacy within the next 1-3 days. If you use inhalers (even only as needed), please bring them with you on the day of your procedure.  We have sent the following medications to your pharmacy for you to pick up at your convenience: Lomotil  Use Lomotil 2-4 times daily as needed.   Increase your Cholestyramine to twice daily.   _______________________________________________________  If you are age 77 or older, your body mass index should be between 23-30. Your Body mass index is 35.18 kg/m. If this is out of the aforementioned range listed, please consider follow up with your Primary Care Provider.  If you are age 30 or younger, your body mass index should be between 19-25. Your Body mass index is 35.18 kg/m. If this is out of the aformentioned range listed, please consider follow up with your Primary Care Provider.   _____________________________________________________  The [facility] providers would like to encourage you to use [facility] to communicate with providers for non-urgent requests or questions.  Due to long hold times on the telephone, sending your provider a message by [facility] may be a faster and more efficient way to get a response.  Please allow 48 business hours for a response.  Please remember that this is for non-urgent requests.  _____________________________________________________ Thank you for choosing me and [facility].  Dr. Rush Landmark

## 2022-01-19 NOTE — Progress Notes (Signed)
[facility] VISIT   Primary Care Provider Velna Hatchet, MD [place] [id number] [phone]  Patient Profile: Colleen Hancock is a 76 y.o. female with a pmh significant for hypertension, hyperlipidemia, prior breast cancer, colon cancer (status post right hemicolectomy), IBS-D, status post cholecystectomy (on cholestyramine), colon polyps.  The patient presents to the [facility] for an evaluation and management of problem(s) noted below:  Problem List 1. History of colon cancer   2. Irritable bowel syndrome with diarrhea   3. Bile salt-induced diarrhea   4. Chronic diarrhea   5. History of cholecystectomy   6. History of right hemicolectomy     History of Present Illness Please see prior GI notes for full details of HPI.  Interval History The patient is accompanied by her daughter.  She is a patient of Dr. Ardis Hughs and is being seen by me for the first time.  She was diagnosed with colon cancer in 2022 and underwent right hemicolectomy.  Patient states that she continues to experience significant issues of diarrhea.  She has had IBS-D for years but things have continued to worsen over the course of the last year since her resection.  She uses cholestyramine 4 g once daily.  She will use Lomotil once daily and no more.  She has tried bulking fibers and uses Metamucil on a daily basis once daily.  She has not noticed any blood in her stools.  She has never increased her cholestyramine dosing.  She has fecal urgency and at times has had near incontinence episodes.  This prevents her from being as interactive with being outside and with others and hopes that there is some sort of way of mitigating or understanding what is going on.  The patient denies any other significant GI symptoms.  GI Review of Systems Positive as above Negative for dysphagia, odynophagia, nausea, vomiting, melena, hematochezia  Review of  Systems General: Denies fevers/chills/weight loss unintentionally (she is gaining weight) Cardiovascular: Denies chest pain Pulmonary: Denies shortness of breath Gastroenterological: See HPI Genitourinary: Denies darkened urine Hematological: Denies easy bruising/bleeding Endocrine: Denies temperature intolerance Dermatological: Denies jaundice Psychological: Mood is stable   Medications Current Outpatient Medications  Medication Sig Dispense Refill   albuterol (PROVENTIL HFA;VENTOLIN HFA) 108 (90 BASE) MCG/ACT inhaler Inhale 1 puff into the lungs every 6 (six) hours as needed for wheezing or shortness of breath (Asthma).     atorvastatin (LIPITOR) 20 MG tablet Take 20 mg by mouth daily before breakfast.     cetirizine (ZYRTEC ALLERGY) 10 MG tablet Take 1 tablet (10 mg total) by mouth daily. (Patient taking differently: Take 10 mg by mouth every evening.)     Cyanocobalamin (B-12 PO) Take by mouth.     dicyclomine (BENTYL) 10 MG capsule Take 10 mg by mouth daily at 6 (six) AM. (1st thing in the morning)     fluticasone (FLONASE) 50 MCG/ACT nasal spray Place 1 spray into both nostrils daily as needed.     lisinopril (ZESTRIL) 20 MG tablet Take 20 mg by mouth at bedtime.     LORazepam (ATIVAN) 0.5 MG tablet 1 tablet as needed Orally Once a day     Multiple Vitamins-Minerals (MULTIVITAMIN GUMMIES WOMENS PO) Take 1 tablet by mouth every evening. One-A-Day Women's  pill     Omega-3 Fatty Acids (FISH OIL) 1200 MG CAPS Take 1,200 mg by mouth every evening.     Polyethyl Glycol-Propyl Glycol (SYSTANE) 0.4-0.3 % SOLN Place 1 drop into  both eyes daily as needed (dry eyes).     traMADol (ULTRAM) 50 MG tablet Take 1-2 tablets (50-100 mg total) by mouth every 6 (six) hours as needed for moderate pain (for pain.). 30 tablet 0   cholestyramine (QUESTRAN) 4 GM/DOSE powder Take 1 packet (4 g total) by mouth 2 (two) times daily with a meal. 378 g 1   diphenoxylate-atropine (LOMOTIL) 2.5-0.025 MG tablet  Take 1 tablet by mouth 4 (four) times daily as needed for diarrhea or loose stools. 60 tablet 2   ferrous sulfate 325 (65 FE) MG tablet Take 1 tablet (325 mg total) by mouth 2 (two) times daily with a meal. (Patient not taking: Reported on 01/19/2022) 40 tablet 2   gabapentin (NEURONTIN) 300 MG capsule Take 1 capsule (300 mg total) by mouth 3 (three) times daily. (Patient not taking: Reported on 01/19/2022) 60 capsule 3   No current facility-administered medications for this visit.    Allergies Allergies  Allergen Reactions   Fentanyl Other (See Comments)    Low blood pressure.   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   Breast CA ([place]) 10/2012   Left Breast   Complication of anesthesia    during colonoscopy fentanyl dropped blood pressure 2013 or 2014   Diverticulitis 09/14/2020   no meds currently   Dyspnea 09/14/2020   on exertion can climb flight of steps   Fatty liver 09/14/2020   GERD (gastroesophageal reflux disease)    Heart murmur 09/14/2020   mild no cardiologist   High cholesterol 09/14/2020   History of migraine    none since 1990's   Hypertension 09/14/2020   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   with diarrhea   Malignant neoplasm of female breast ([place]) 03/07/2012   Pneumonia    yrs ago per pt on 09-14-2020   Swelling of joint of left shoulder 09/14/2020   swelling under left shoulder, 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   Thickened endometrium 09/14/2020   Wears glasses 09/14/2020   Past Surgical History:  Procedure Laterality Date   APPENDECTOMY  12/10/2020   BREAST BIOPSY Left 10/2012   Stereo- Malignant   BREAST BIOPSY Left    U/S Core- Benign   BREAST EXCISIONAL BIOPSY Left    BREAST EXCISIONAL BIOPSY Left    BREAST LUMPECTOMY Left 11/2012   radiation tx done x 34 treatments   CESAREAN SECTION  1972   CHOLECYSTECTOMY  2010   laparoscopic   COLON RESECTION  12/10/2020   COLONOSCOPY     2013 or 2014   DILATION AND CURETTAGE OF UTERUS  1967   HYSTEROSCOPY WITH D & C N/A 09/18/2020   Procedure: DILATATION AND CURETTAGE /HYSTEROSCOPY/ POLYPECTOMY;  Surgeon: Janyth Contes, MD;  Location: [facility];  Service: Gynecology;  Laterality: N/A;   MASTECTOMY Left  2018   MASTECTOMY W/ SENTINEL NODE BIOPSY Left 08/18/2016   MASTECTOMY W/ SENTINEL NODE BIOPSY Left 08/18/2016   Procedure: LEFT TOTAL MASTECTOMY WITH SENTINEL LYMPH NODE BIOPSY WITH BLUE DYE INJECTION;  Surgeon: Rolm Bookbinder, MD;  Location: [facility];  Service: General;  Laterality: Left;   SKIN CANCER EXCISION Left 2009   eye brow   Social History   Socioeconomic History   Marital status: Married    Spouse name: Not on file   Number of children: 2   Years of education: Not on file   Highest education level: Not on file  Occupational History   Occupation: retired  Tobacco Use   Smoking status: Never   Smokeless tobacco: Never  Vaping Use   Vaping Use: Never used  Substance and Sexual Activity   Alcohol use: Yes    Comment: 1 glass wine at new years only   Drug use: No   Sexual activity: Not on file  Other Topics Concern   Not on file  Social History Narrative   Not on file   Social Determinants of Health   Financial Resource Strain: Not on file  Food Insecurity: Not on file  Transportation Needs: Not on file  Physical Activity: Not on file  Stress: Not on file  Social Connections:  Not on file  Intimate Partner Violence: Not on file   Family History  Problem Relation Age of Onset   Alzheimer's disease Mother    Heart attack Father    Heart disease Father    Multiple sclerosis Brother    Epilepsy Brother    Alzheimer's disease Brother    Hypertension Brother    Heart disease Brother    Hyperlipidemia Brother    Kidney Stones Brother    Breast cancer Maternal Aunt    Hodgkin's lymphoma Son    Heart disease Son    Diabetes Son    Breast cancer Cousin    Breast cancer Cousin    Breast cancer Cousin    Heart disease Other    Colon polyps Neg Hx    Colon cancer Neg Hx    Esophageal cancer Neg Hx    Rectal cancer Neg Hx    Stomach cancer Neg Hx    Inflammatory bowel disease Neg Hx    Liver disease Neg Hx    Pancreatic cancer Neg Hx    I have reviewed her medical, social, and family history in detail and updated the electronic medical record as necessary.    PHYSICAL EXAMINATION  BP 130/74   Pulse 68   Ht _0  (1.6 m)   Wt 198 lb 9.6 oz (90.1 kg)   SpO2 97%   BMI 35.18 kg/m  Wt Readings from Last 3 Encounters:  01/19/22 198 lb 9.6 oz (90.1 kg)  03/26/21 180 lb (81.6 kg)  03/22/21 182 lb 14.4 oz (83 kg)  GEN: NAD, appears younger than stated age, doesn't appear chronically ill, accompanied by daughter PSYCH: Cooperative, without pressured speech EYE: Conjunctivae pink, sclerae anicteric ENT: MMM CV: Nontachycardic RESP: No audible wheezing GI: NABS, soft, NT/ND, surgical scars present, without rebound or guarding MSK/EXT: Trace bilateral pedal edema SKIN: No jaundice NEURO:  Alert & Oriented x 3, no focal deficits   REVIEW OF DATA  I reviewed the following data at the time of this encounter:  GI Procedures and Studies  August 2022 colonoscopy - One 10 mm polyp at the appendiceal orifice, removed with a hot snare. Resected and retrieved. - Diverticulosis  in the left colon. - The examination was otherwise normal on direct and  retroflexion views.  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 - SEE MICROSCOPIC DESCRIPTION. Microscopic Comment Multiple biopsy fragments are involved by carcinoma including the edge of some of the fragments.  Laboratory Studies  Reviewed those in epic  Imaging Studies  No new imaging to review   ASSESSMENT  Ms. Mcelvain is a 76 y.o. female with a pmh significant for hypertension, hyperlipidemia, prior breast cancer, colon cancer (status post right hemicolectomy), IBS-D, status post cholecystectomy (on cholestyramine), colon polyps.  The patient is seen today for evaluation and management of:  1. History of colon cancer   2. Irritable bowel syndrome with diarrhea   3. Bile salt-induced diarrhea   4. Chronic diarrhea   5. History of cholecystectomy   6. History of right hemicolectomy    The patient is hemodynamically stable.  Clinically, the patient is describing persisting issues of diarrhea.  She has had chronic diarrheal symptoms that she and her daughter state for years but slightly exacerbated as a result of her colon cancer treatment last year.  With this being said, patient has not had any significant weight loss and has actually had some weight gain.  She is using low doses of cholestyramine as well as bulking fiber as well as antimotility agents.  I have asked the patient to try to uptitrate cholestyramine and use up to 8 or 12 g of cholestyramine daily to see if that makes any difference.  She may also use Lomotil more than once daily.  From a bulking fiber standpoint she seems to have met her needs but if she can tolerate further amounts of Metamucil without significant bloating she may continue that.  She is at risk of bile salt diarrhea.  She is at risk of SIBO.  She is at risk of small intestine bacterial overgrowth.  She could have collagenous colitis.  At the end of  the day, she may just have IBS-diarrhea.  Plan of action will be to see how she does with titration of her medications and plan an updated colon cancer surveillance and also ensure that she does not have collagenous/microscopic colitis that could be a reason for her chronic diarrheal symptoms.  SIBO breath testing and the possibility of empiric IBS-D Xifaxan treatment will be considered as well.  The risks and benefits of endoscopic evaluation were discussed with the patient; these include but are not limited to the risk of perforation, infection, bleeding, missed lesions, lack of diagnosis, severe illness requiring hospitalization, as well as anesthesia and sedation related illnesses.  The patient and/or family is agreeable to proceed.  All patient questions were answered to the best of my ability, and the patient agrees to the aforementioned plan of action with follow-up as indicated.   PLAN  Laboratories as outlined below Fecal elastase to be obtained Consider SIBO breath testing in future Consider empiric IBS-D Xifaxan treatment Colonoscopy for surveillance in setting of high risk previous colon cancer - We will obtain biopsies to ensure no evidence of collagenous colitis as a reason for her symptoms May increase cholestyramine to 8 or 12 g/day to see if that makes a difference May use Lomotil 2-4 times per day as needed Metamucil can continue (not sure she can further titrate upwards due to bloating symptoms)   Orders Placed This Encounter  Procedures   Comp Met (CMET)  TSH   Magnesium   Pancreatic elastase, fecal   Ambulatory referral to Gastroenterology    New Prescriptions   No medications on file   Modified Medications   Modified Medication Previous Medication   CHOLESTYRAMINE (QUESTRAN) 4 GM/DOSE POWDER cholestyramine (QUESTRAN) 4 GM/DOSE powder      Take 1 packet (4 g total) by mouth 2 (two) times daily with a meal.    Take 0.75 packets by mouth daily after breakfast.    DIPHENOXYLATE-ATROPINE (LOMOTIL) 2.5-0.025 MG TABLET diphenoxylate-atropine (LOMOTIL) 2.5-0.025 MG tablet      Take 1 tablet by mouth 4 (four) times daily as needed for diarrhea or loose stools.    Take 1 tablet by mouth 4 (four) times daily as needed for diarrhea or loose stools.    Planned Follow Up No follow-ups on file.   Total Time in Face-to-Face and in Coordination of Care for patient including independent/personal interpretation/review of prior testing, medical history, examination, medication adjustment, communicating results with the patient directly, and documentation within the EHR is 30 minutes.   Justice Britain, MD [facility] Office # [phone]

## 2022-01-21 ENCOUNTER — Other Ambulatory Visit (INDEPENDENT_AMBULATORY_CARE_PROVIDER_SITE_OTHER): Payer: Medicare Other

## 2022-01-21 DIAGNOSIS — Z85038 Personal history of other malignant neoplasm of large intestine: Secondary | ICD-10-CM

## 2022-01-21 DIAGNOSIS — K529 Noninfective gastroenteritis and colitis, unspecified: Secondary | ICD-10-CM | POA: Diagnosis not present

## 2022-01-21 LAB — COMPREHENSIVE METABOLIC PANEL
ALT: 23 U/L (ref 0–35)
AST: 22 U/L (ref 0–37)
Albumin: 4.5 g/dL (ref 3.5–5.2)
Alkaline Phosphatase: 77 U/L (ref 39–117)
BUN: 20 mg/dL (ref 6–23)
CO2: 28 mEq/L (ref 19–32)
Calcium: 9.5 mg/dL (ref 8.4–10.5)
Chloride: 105 mEq/L (ref 96–112)
Creatinine, Ser: 0.97 mg/dL (ref 0.40–1.20)
GFR: 56.6 mL/min — ABNORMAL LOW (ref >60.00)
Glucose, Bld: 103 mg/dL — ABNORMAL HIGH (ref 70–99)
Potassium: 4 mEq/L (ref 3.5–5.1)
Sodium: 141 mEq/L (ref 135–145)
Total Bilirubin: 0.6 mg/dL (ref 0.2–1.2)
Total Protein: 7.3 g/dL (ref 6.0–8.3)

## 2022-01-21 LAB — MAGNESIUM: Magnesium: 2.1 mg/dL (ref 1.5–2.5)

## 2022-01-21 LAB — TSH: TSH: 2.78 u[IU]/mL (ref 0.35–5.50)

## 2022-01-22 DIAGNOSIS — Z9049 Acquired absence of other specified parts of digestive tract: Secondary | ICD-10-CM | POA: Insufficient documentation

## 2022-01-22 DIAGNOSIS — Z85038 Personal history of other malignant neoplasm of large intestine: Secondary | ICD-10-CM | POA: Insufficient documentation

## 2022-01-22 DIAGNOSIS — K529 Noninfective gastroenteritis and colitis, unspecified: Secondary | ICD-10-CM | POA: Insufficient documentation

## 2022-01-22 DIAGNOSIS — K9089 Other intestinal malabsorption: Secondary | ICD-10-CM | POA: Insufficient documentation

## 2022-01-25 ENCOUNTER — Other Ambulatory Visit: Payer: Self-pay

## 2022-01-25 DIAGNOSIS — Z85038 Personal history of other malignant neoplasm of large intestine: Secondary | ICD-10-CM

## 2022-01-25 DIAGNOSIS — K529 Noninfective gastroenteritis and colitis, unspecified: Secondary | ICD-10-CM

## 2022-02-02 ENCOUNTER — Other Ambulatory Visit: Payer: Medicare Other

## 2022-02-02 DIAGNOSIS — Z85038 Personal history of other malignant neoplasm of large intestine: Secondary | ICD-10-CM

## 2022-02-02 DIAGNOSIS — K529 Noninfective gastroenteritis and colitis, unspecified: Secondary | ICD-10-CM

## 2022-02-07 LAB — PANCREATIC ELASTASE, FECAL: Pancreatic Elastase-1, Stool: 444 mcg/g

## 2022-02-12 ENCOUNTER — Encounter: Payer: Medicare Other | Admitting: Gastroenterology

## 2022-03-02 ENCOUNTER — Ambulatory Visit (AMBULATORY_SURGERY_CENTER): Payer: Medicare Other | Admitting: Gastroenterology

## 2022-03-02 ENCOUNTER — Encounter: Payer: Self-pay | Admitting: Gastroenterology

## 2022-03-02 VITALS — BP 146/82 | HR 72 | Temp 96.5°F | Resp 14 | Ht 63.0 in | Wt 198.0 lb

## 2022-03-02 DIAGNOSIS — Z980 Intestinal bypass and anastomosis status: Secondary | ICD-10-CM | POA: Diagnosis not present

## 2022-03-02 DIAGNOSIS — K760 Fatty (change of) liver, not elsewhere classified: Secondary | ICD-10-CM | POA: Diagnosis not present

## 2022-03-02 DIAGNOSIS — Z08 Encounter for follow-up examination after completed treatment for malignant neoplasm: Secondary | ICD-10-CM

## 2022-03-02 DIAGNOSIS — Z85038 Personal history of other malignant neoplasm of large intestine: Secondary | ICD-10-CM

## 2022-03-02 DIAGNOSIS — D123 Benign neoplasm of transverse colon: Secondary | ICD-10-CM | POA: Diagnosis not present

## 2022-03-02 DIAGNOSIS — I10 Essential (primary) hypertension: Secondary | ICD-10-CM | POA: Diagnosis not present

## 2022-03-02 DIAGNOSIS — F419 Anxiety disorder, unspecified: Secondary | ICD-10-CM | POA: Diagnosis not present

## 2022-03-02 MED ORDER — SODIUM CHLORIDE 0.9 % IV SOLN: 500.0000 mL | Freq: Once | INTRAVENOUS | Status: DC

## 2022-03-02 NOTE — Progress Notes (Signed)
GASTROENTEROLOGY PROCEDURE H&P NOTE   Primary Care Physician: Velna Hatchet, MD  HPI: Colleen Hancock is a 76 y.o. female who presents for Colonoscopy for surveillance in setting of previous Colon Cancer history as well as issues of diarrhea chronic.  Past Medical History:  Diagnosis Date   Allergy    Anxiety 09/14/2020   Arthritis    hands, knees   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   Breast CA ([place]) 10/2012   Left Breast   Complication of anesthesia    during colonoscopy fentanyl dropped blood pressure 2013 or 2014   Diverticulitis 09/14/2020   no meds currently   Dyspnea 09/14/2020   on exertion can climb flight of steps   Fatty liver 09/14/2020   GERD (gastroesophageal reflux disease)    Heart murmur 09/14/2020   mild no cardiologist   High cholesterol 09/14/2020   History of migraine    none since 1990's   Hypertension 09/14/2020   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   with diarrhea   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   Pneumonia    yrs ago per pt on 09-14-2020   Swelling of joint of left shoulder 09/14/2020   swelling under left shoulder, 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   Thickened endometrium 09/14/2020   Wears glasses 09/14/2020   Past Surgical History:  Procedure Laterality Date   APPENDECTOMY  12/10/2020   BREAST BIOPSY Left 10/2012   Stereo- Malignant   BREAST BIOPSY Left    U/S Core- Benign   BREAST EXCISIONAL BIOPSY Left    BREAST EXCISIONAL BIOPSY Left    BREAST LUMPECTOMY Left 11/2012   radiation tx done x 34 treatments   CESAREAN SECTION  1972   CHOLECYSTECTOMY  2010   laparoscopic   COLON RESECTION  12/10/2020   COLONOSCOPY     2013 or 2014   DILATION AND CURETTAGE OF UTERUS  1967   HYSTEROSCOPY WITH D & C N/A 09/18/2020   Procedure: DILATATION AND CURETTAGE /HYSTEROSCOPY/ POLYPECTOMY;  Surgeon: Janyth Contes, MD;  Location: [facility];  Service:  Gynecology;  Laterality: N/A;   MASTECTOMY Left 2018   MASTECTOMY W/ SENTINEL NODE BIOPSY Left 08/18/2016   MASTECTOMY W/ SENTINEL NODE BIOPSY Left 08/18/2016   Procedure: LEFT TOTAL MASTECTOMY WITH SENTINEL LYMPH NODE BIOPSY WITH BLUE DYE INJECTION;  Surgeon: Rolm Bookbinder, MD;  Location: [facility];  Service: General;  Laterality: Left;   SKIN CANCER EXCISION Left 2009   eye brow   Current Outpatient Medications  Medication Sig Dispense Refill   atorvastatin (LIPITOR) 20 MG tablet Take 20 mg by mouth daily before breakfast.     cetirizine (ZYRTEC ALLERGY) 10 MG tablet Take 1 tablet (10 mg total) by mouth daily. (Patient taking differently: Take 10 mg by mouth every evening.)     cholestyramine (QUESTRAN) 4 GM/DOSE powder Take 1 packet (4 g total) by mouth 2 (two) times daily with a meal. 378 g 1   Cyanocobalamin (B-12 PO) Take by mouth.     dicyclomine (BENTYL) 10 MG capsule Take 10 mg by mouth daily at 6 (six) AM. (1st thing in the morning)     fluticasone (FLONASE) 50 MCG/ACT nasal spray Place 1 spray into both nostrils daily as needed.     lisinopril (ZESTRIL) 20 MG tablet Take 20 mg by mouth at bedtime.     Multiple Vitamins-Minerals (MULTIVITAMIN GUMMIES WOMENS PO) Take 1 tablet by mouth every evening. One-A-Day Women's  pill     Omega-3 Fatty Acids (FISH OIL) 1200 MG CAPS Take 1,200 mg by mouth every evening.     albuterol (PROVENTIL HFA;VENTOLIN HFA) 108 (90 BASE) MCG/ACT inhaler Inhale 1 puff into the lungs every 6 (six) hours as needed for wheezing or shortness of breath (Asthma).     diphenoxylate-atropine (LOMOTIL) 2.5-0.025 MG tablet Take 1 tablet by mouth 4 (four) times daily as needed for diarrhea or loose stools. 60 tablet 2   ferrous sulfate 325 (65 FE) MG tablet Take 1 tablet (325 mg total) by mouth 2 (two) times daily with a meal. (Patient not taking: Reported on 01/19/2022) 40 tablet 2   gabapentin (NEURONTIN) 300 MG capsule Take 1 capsule (300 mg total) by mouth 3 (three)  times daily. (Patient not taking: Reported on 01/19/2022) 60 capsule 3   LORazepam (ATIVAN) 0.5 MG tablet 1 tablet as needed Orally Once a day     Polyethyl Glycol-Propyl Glycol (SYSTANE) 0.4-0.3 % SOLN Place 1 drop into both eyes daily as needed (dry eyes).     traMADol (ULTRAM) 50 MG tablet Take 1-2 tablets (50-100 mg total) by mouth every 6 (six) hours as needed for moderate pain (for pain.). 30 tablet 0   Current Facility-Administered Medications  Medication Dose Route Frequency Provider Last Rate Last Admin   0.9 %  sodium chloride infusion  500 mL Intravenous Once Mansouraty, Telford Nab., MD        Current Outpatient Medications:    atorvastatin (LIPITOR) 20 MG tablet, Take 20 mg by mouth daily before breakfast., Disp: , Rfl:    cetirizine (ZYRTEC ALLERGY) 10 MG tablet, Take 1 tablet (10 mg total) by mouth daily. (Patient taking differently: Take 10 mg by mouth every evening.), Disp: , Rfl:    cholestyramine (QUESTRAN) 4 GM/DOSE powder, Take 1 packet (4 g total) by mouth 2 (two) times daily with a meal., Disp: 378 g, Rfl: 1   Cyanocobalamin (B-12 PO), Take by mouth., Disp: , Rfl:    dicyclomine (BENTYL) 10 MG capsule, Take 10 mg by mouth daily at 6 (six) AM. (1st thing in the morning), Disp: , Rfl:    fluticasone (FLONASE) 50 MCG/ACT nasal spray, Place 1 spray into both nostrils daily as needed., Disp: , Rfl:    lisinopril (ZESTRIL) 20 MG tablet, Take 20 mg by mouth at bedtime., Disp: , Rfl:    Multiple Vitamins-Minerals (MULTIVITAMIN GUMMIES WOMENS PO), Take 1 tablet by mouth every evening. One-A-Day Women's  pill, Disp: , Rfl:    Omega-3 Fatty Acids (FISH OIL) 1200 MG CAPS, Take 1,200 mg by mouth every evening., Disp: , Rfl:    albuterol (PROVENTIL HFA;VENTOLIN HFA) 108 (90 BASE) MCG/ACT inhaler, Inhale 1 puff into the lungs every 6 (six) hours as needed for wheezing or shortness of breath (Asthma)., Disp: , Rfl:    diphenoxylate-atropine (LOMOTIL) 2.5-0.025 MG tablet, Take 1 tablet by  mouth 4 (four) times daily as needed for diarrhea or loose stools., Disp: 60 tablet, Rfl: 2   ferrous sulfate 325 (65 FE) MG tablet, Take 1 tablet (325 mg total) by mouth 2 (two) times daily with a meal. (Patient not taking: Reported on 01/19/2022), Disp: 40 tablet, Rfl: 2   gabapentin (NEURONTIN) 300 MG capsule, Take 1 capsule (300 mg total) by mouth 3 (three) times daily. (Patient not taking: Reported on 01/19/2022), Disp: 60 capsule, Rfl: 3   LORazepam (ATIVAN) 0.5 MG tablet, 1 tablet as needed Orally Once a day, Disp: , Rfl:  Polyethyl Glycol-Propyl Glycol (SYSTANE) 0.4-0.3 % SOLN, Place 1 drop into both eyes daily as needed (dry eyes)., Disp: , Rfl:    traMADol (ULTRAM) 50 MG tablet, Take 1-2 tablets (50-100 mg total) by mouth every 6 (six) hours as needed for moderate pain (for pain.)., Disp: 30 tablet, Rfl: 0  Current Facility-Administered Medications:    0.9 %  sodium chloride infusion, 500 mL, Intravenous, Once, Mansouraty, Telford Nab., MD Allergies  Allergen Reactions   Fentanyl Other (See Comments)    Low blood pressure.   Tetracyclines & Related Hives   Other     SEAFOOD > UNSPECIFIED REACTION      Sodium Lactate Diarrhea   Sucrose     Artificial sugars   Clams [Shellfish Allergy] Diarrhea   Nsaids Nausea And Vomiting    Stomach upset /had hx of stomach ulcers   Pentazocine Lactate Nausea And Vomiting    [place] n/v tunnel vision   Tylenol [Acetaminophen] Other (See Comments)    Worried may cause liver problems - 3g/day max   Family History  Problem Relation Age of Onset   Alzheimer's disease Mother    Heart attack Father    Heart disease Father    Multiple sclerosis Brother    Epilepsy Brother    Alzheimer's disease Brother    Hypertension Brother    Heart disease Brother    Hyperlipidemia Brother    Kidney Stones Brother    Breast cancer Maternal Aunt    Hodgkin's lymphoma Son    Heart disease Son    Diabetes Son    Breast cancer Cousin    Breast cancer  Cousin    Breast cancer Cousin    Heart disease Other    Colon polyps Neg Hx    Colon cancer Neg Hx    Esophageal cancer Neg Hx    Rectal cancer Neg Hx    Stomach cancer Neg Hx    Inflammatory bowel disease Neg Hx    Liver disease Neg Hx    Pancreatic cancer Neg Hx    Social History   Socioeconomic History   Marital status: Married    Spouse name: Not on file   Number of children: 2   Years of education: Not on file   Highest education level: Not on file  Occupational History   Occupation: retired  Tobacco Use   Smoking status: Never   Smokeless tobacco: Never  Vaping Use   Vaping Use: Never used  Substance and Sexual Activity   Alcohol use: Yes    Comment: 1 glass wine at new years only   Drug use: No   Sexual activity: Not on file  Other Topics Concern   Not on file  Social History Narrative   Not on file   Social Determinants of Health   Financial Resource Strain: Not on file  Food Insecurity: Not on file  Transportation Needs: Not on file  Physical Activity: Not on file  Stress: Not on file  Social Connections: Not on file  Intimate Partner Violence: Not on file    Physical Exam: Today's Vitals   03/02/22 0854 03/02/22 0855  BP: (!) 151/74   Pulse: 84   Temp: (!) 96.5 F (35.8 C) (!) 96.5 F (35.8 C)  TempSrc: Skin   SpO2: 94%   Weight: 198 lb (89.8 kg)   Height: '5\' 3"'$  (1.6 m)    Body mass index is 35.07 kg/m. GEN: NAD EYE: Sclerae anicteric ENT: MMM CV: Non-tachycardic GI: Soft, NT/ND  NEURO:  Alert & Oriented x 3  Lab Results: No results for input(s): "WBC", "HGB", "HCT", "PLT" in the last 72 hours. BMET No results for input(s): "NA", "K", "CL", "CO2", "GLUCOSE", "BUN", "CREATININE", "CALCIUM" in the last 72 hours. LFT No results for input(s): "PROT", "ALBUMIN", "AST", "ALT", "ALKPHOS", "BILITOT", "BILIDIR", "IBILI" in the last 72 hours. PT/INR No results for input(s): "LABPROT", "INR" in the last 72 hours.   Impression /  Plan: This is a 76 y.o.female who presents for Colonoscopy for surveillance in setting of previous Colon Cancer history as well as issues of diarrhea chronic.  The risks and benefits of endoscopic evaluation/treatment were discussed with the patient and/or family; these include but are not limited to the risk of perforation, infection, bleeding, missed lesions, lack of diagnosis, severe illness requiring hospitalization, as well as anesthesia and sedation related illnesses.  The patient's history has been reviewed, patient examined, no change in status, and deemed stable for procedure.  The patient and/or family is agreeable to proceed.    Justice Britain, MD [facility]oscopy Office # [phone]

## 2022-03-02 NOTE — Progress Notes (Signed)
VS by CW  Pt's states no medical or surgical changes since previsit or office visit.  

## 2022-03-02 NOTE — Op Note (Signed)
[name] Procedure Date: 03/02/2022 9:32 AM MRN: [id number] Endoscopist: Justice Britain , MD, [id number] Age: 76 Referring MD:  Date of Birth: 1945-07-30 Gender: Female Account #: [id number] Procedure:                Colonoscopy Indications:              High risk colon cancer surveillance: Personal                            history of colon cancer, Incidental - Chronic                            diarrhea Medicines:                Monitored Anesthesia Care Procedure:                Pre-Anesthesia Assessment:                           - Prior to the procedure, a History and Physical                            was performed, and patient medications and                            allergies were reviewed. The patient's tolerance of                            previous anesthesia was also reviewed. The risks                            and benefits of the procedure and the sedation                            options and risks were discussed with the patient.                            All questions were answered, and informed consent                            was obtained. Prior Anticoagulants: The patient has                            taken no anticoagulant or antiplatelet agents. ASA                            Grade Assessment: II - A patient with mild systemic                            disease. After reviewing the risks and benefits,                            the patient was deemed in satisfactory condition to  undergo the procedure.                           After obtaining informed consent, the colonoscope                            was passed under direct vision. Throughout the                            procedure, the patient's blood pressure, pulse, and                            oxygen saturations were monitored continuously. The                            CF [id number] #[id number] was introduced through the  anus                            and advanced to the the cecum, identified by the                            appendiceal orifice. The colonoscopy was performed                            without difficulty. The patient tolerated the                            procedure. The quality of the bowel preparation was                            adequate. Scope In: 9:41:21 AM Scope Out: 9:58:09 AM Scope Withdrawal Time: 0 hours 14 minutes 35 seconds  Total Procedure Duration: 0 hours 16 minutes 48 seconds  Findings:                 The digital rectal exam was normal. Pertinent                            negatives include no palpable rectal lesions.                           A large amount of semi-liquid stool was found in                            the entire colon, interfering with visualization.                            Lavage of the area was performed using copious                            amounts, resulting in clearance with adequate                            visualization.  There was evidence of a prior end-to-side                            ileo-colonic anastomosis in the transverse colon.                            This was patent and was characterized by healthy                            appearing mucosa and an intact staple line (9                            staples). The anastomosis was traversed. Removal of                            staples was accomplished with a regular forceps.                           The neo-terminal ileum appeared normal.                           A 4 mm polyp was found in the transverse colon. The                            polyp was sessile. The polyp was removed with a                            cold snare. Resection and retrieval were complete.                           Multiple small-mouthed diverticula were found in                            the recto-sigmoid colon, sigmoid colon and                            descending  colon.                           Normal mucosa was found in the entire colon.                            Biopsies for histology were taken with a cold                            forceps from the entire colon for evaluation of                            microscopic colitis.                           Non-bleeding non-thrombosed internal hemorrhoids  were found during retroflexion, during perianal                            exam and during digital exam. The hemorrhoids were                            Grade I (internal hemorrhoids that do not prolapse). Complications:            No immediate complications. Estimated Blood Loss:     Estimated blood loss was minimal. Impression:               - Stool in the entire examined colon.                           - Patent end-to-side ileo-colonic anastomosis,                            characterized by healthy appearing mucosa and an                            intact staple line (staples removed).                           - The examined portion of the ileum was normal.                           - One 4 mm polyp in the transverse colon, removed                            with a cold snare. Resected and retrieved.                           - Diverticulosis in the recto-sigmoid colon, in the                            sigmoid colon and in the descending colon.                           - Normal mucosa in the entire examined colon.                            Biopsied.                           - Non-bleeding non-thrombosed internal hemorrhoids. Recommendation:           - The patient will be observed post-procedure,                            until all discharge criteria are met.                           - Discharge patient to home.                           - Patient has a  contact number available for                            emergencies. The signs and symptoms of potential                            delayed complications  were discussed with the                            patient. Return to normal activities tomorrow.                            Written discharge instructions were provided to the                            patient.                           - High fiber diet.                           - Continue present medications.                           - Await pathology results.                           - Repeat colonoscopy in 3 years for surveillance.                           - Pending patient pathology results and follow up                            in regards to the diarrheal symptoms will be made                            in clinic.                           - The findings and recommendations were discussed                            with the patient.                           - The findings and recommendations were discussed                            with the patient's family. Justice Britain, MD 03/02/2022 10:05:45 AM

## 2022-03-02 NOTE — Progress Notes (Signed)
Called to room to assist during endoscopic procedure.  Patient ID and intended procedure confirmed with present staff. Received instructions for my participation in the procedure from the performing physician.  

## 2022-03-02 NOTE — Patient Instructions (Signed)
Take all of your medications as ordered today.  Read all of the handouts given to you by your recovery room nurse. You will need a repeat colonoscopy in 1 year.  YOU HAD AN ENDOSCOPIC PROCEDURE TODAY AT [facility]:   Refer to the procedure report that was given to you for any specific questions about what was found during the examination.  If the procedure report does not answer your questions, please call your gastroenterologist to clarify.  If you requested that your care partner not be given the details of your procedure findings, then the procedure report has been included in a sealed envelope for you to review at your convenience later.  YOU SHOULD EXPECT: Some feelings of bloating in the abdomen. Passage of more gas than usual.  Walking can help get rid of the air that was put into your GI tract during the procedure and reduce the bloating. If you had a lower endoscopy (such as a colonoscopy or flexible sigmoidoscopy) you may notice spotting of blood in your stool or on the toilet paper. If you underwent a bowel prep for your procedure, you may not have a normal bowel movement for a few days.  Please Note:  You might notice some irritation and congestion in your nose or some drainage.  This is from the oxygen used during your procedure.  There is no need for concern and it should clear up in a day or so.  SYMPTOMS TO REPORT IMMEDIATELY:  Following lower endoscopy (colonoscopy or flexible sigmoidoscopy):  Excessive amounts of blood in the stool  Significant tenderness or worsening of abdominal pains  Swelling of the abdomen that is new, acute  Fever of 100F or higher   For urgent or emergent issues, a gastroenterologist can be reached at any hour by calling [phone]. Do not use MyChart messaging for urgent concerns.    DIET:  We do recommend a small meal at first, but then you may proceed to your regular diet.  Drink plenty of fluids but you should avoid alcoholic  beverages for 24 hours.  Try to increase the fiber in your diet, and take fiber con 1-2 every day.  Drink plenty of water.  ACTIVITY:  You should plan to take it easy for the rest of today and you should NOT DRIVE or use heavy machinery until tomorrow (because of the sedation medicines used during the test).    FOLLOW UP: Our staff will call the number listed on your records the next business day following your procedure.  We will call around 7:15- 8:00 am to check on you and address any questions or concerns that you may have regarding the information given to you following your procedure. If we do not reach you, we will leave a message.     If any biopsies were taken you will be contacted by phone or by letter within the next 1-3 weeks.  Please call us at [phone] if you have not heard about the biopsies in 3 weeks.    SIGNATURES/CONFIDENTIALITY: You and/or your care partner have signed paperwork which will be entered into your electronic medical record.  These signatures attest to the fact that that the information above on your After Visit Summary has been reviewed and is understood.  Full responsibility of the confidentiality of this discharge information lies with you and/or your care-partner.

## 2022-03-02 NOTE — Progress Notes (Signed)
Report to PACU, RN, vss, BBS= Clear.  

## 2022-03-03 ENCOUNTER — Telehealth: Payer: Self-pay

## 2022-03-03 NOTE — Telephone Encounter (Signed)
  Follow up Call-     03/02/2022    8:55 AM 10/21/2020    1:13 PM  Call back number  Post procedure Call Back phone  # [phone]  Permission to leave phone message Yes Yes     Patient questions:  Do you have a fever, pain , or abdominal swelling? No. Pain Score  0 *  Have you tolerated food without any problems? Yes.    Have you been able to return to your normal activities? Yes.    Do you have any questions about your discharge instructions: Diet   No. Medications  No. Follow up visit  No.  Do you have questions or concerns about your Care? No.  Actions: * If pain score is 4 or above: No action needed, pain <4.

## 2022-03-11 ENCOUNTER — Encounter: Payer: Self-pay | Admitting: Gastroenterology

## 2022-03-23 ENCOUNTER — Other Ambulatory Visit: Payer: Self-pay | Admitting: *Deleted

## 2022-03-23 DIAGNOSIS — D0512 Intraductal carcinoma in situ of left breast: Secondary | ICD-10-CM

## 2022-03-24 ENCOUNTER — Inpatient Hospital Stay (HOSPITAL_BASED_OUTPATIENT_CLINIC_OR_DEPARTMENT_OTHER): Payer: Medicare Other | Admitting: Hematology and Oncology

## 2022-03-24 ENCOUNTER — Inpatient Hospital Stay: Payer: Medicare Other | Attending: Hematology and Oncology

## 2022-03-24 VITALS — BP 141/45 | HR 65 | Temp 97.7°F | Resp 18 | Wt 195.2 lb

## 2022-03-24 DIAGNOSIS — Z79899 Other long term (current) drug therapy: Secondary | ICD-10-CM | POA: Diagnosis not present

## 2022-03-24 DIAGNOSIS — Z85038 Personal history of other malignant neoplasm of large intestine: Secondary | ICD-10-CM | POA: Insufficient documentation

## 2022-03-24 DIAGNOSIS — C180 Malignant neoplasm of cecum: Secondary | ICD-10-CM | POA: Diagnosis not present

## 2022-03-24 DIAGNOSIS — R079 Chest pain, unspecified: Secondary | ICD-10-CM | POA: Insufficient documentation

## 2022-03-24 DIAGNOSIS — D0512 Intraductal carcinoma in situ of left breast: Secondary | ICD-10-CM | POA: Diagnosis not present

## 2022-03-24 DIAGNOSIS — Z86000 Personal history of in-situ neoplasm of breast: Secondary | ICD-10-CM | POA: Diagnosis not present

## 2022-03-24 LAB — CBC WITH DIFFERENTIAL (CANCER CENTER ONLY)
Abs Immature Granulocytes: 0.01 10*3/uL (ref 0.00–0.07)
Basophils Absolute: 0 10*3/uL (ref 0.0–0.1)
Basophils Relative: 0 %
Eosinophils Absolute: 0.1 10*3/uL (ref 0.0–0.5)
Eosinophils Relative: 1 %
HCT: 37.6 % (ref 36.0–46.0)
Hemoglobin: 13 g/dL (ref 12.0–15.0)
Immature Granulocytes: 0 %
Lymphocytes Relative: 34 %
Lymphs Abs: 2 10*3/uL (ref 0.7–4.0)
MCH: 33.2 pg (ref 26.0–34.0)
MCHC: 34.6 g/dL (ref 30.0–36.0)
MCV: 96.2 fL (ref 80.0–100.0)
Monocytes Absolute: 0.4 10*3/uL (ref 0.1–1.0)
Monocytes Relative: 7 %
Neutro Abs: 3.5 10*3/uL (ref 1.7–7.7)
Neutrophils Relative %: 58 %
Platelet Count: 171 10*3/uL (ref 150–400)
RBC: 3.91 MIL/uL (ref 3.87–5.11)
RDW: 12.2 % (ref 11.5–15.5)
WBC Count: 6 10*3/uL (ref 4.0–10.5)
nRBC: 0 % (ref 0.0–0.2)

## 2022-03-24 LAB — CMP (CANCER CENTER ONLY)
ALT: 23 U/L (ref 0–44)
AST: 21 U/L (ref 15–41)
Albumin: 4.2 g/dL (ref 3.5–5.0)
Alkaline Phosphatase: 72 U/L (ref 38–126)
Anion gap: 7 (ref 5–15)
BUN: 17 mg/dL (ref 8–23)
CO2: 27 mmol/L (ref 22–32)
Calcium: 9.6 mg/dL (ref 8.9–10.3)
Chloride: 107 mmol/L (ref 98–111)
Creatinine: 1.01 mg/dL — ABNORMAL HIGH (ref 0.44–1.00)
GFR, Estimated: 57 mL/min — ABNORMAL LOW (ref >60)
Glucose, Bld: 83 mg/dL (ref 70–99)
Potassium: 4.2 mmol/L (ref 3.5–5.1)
Sodium: 141 mmol/L (ref 135–145)
Total Bilirubin: 0.5 mg/dL (ref 0.3–1.2)
Total Protein: 6.8 g/dL (ref 6.5–8.1)

## 2022-03-24 NOTE — Assessment & Plan Note (Addendum)
10/21/2020: Colonoscopy: Cecal polyp biopsy: Invasive adenocarcinoma 12/10/2020: Robotic colectomy: No residual invasive cancer, 0/12 lymph nodes negative Preoperative CEA was not elevated  CT CAP 11/07/2020: Indeterminate 5 mm and 2 mm nodules in the right lower lobe  Follow-up with gastroenterology Dr. Rush Landmark

## 2022-03-24 NOTE — Progress Notes (Signed)
Patient Care Team: Velna Hatchet, MD as PCP - General (Internal Medicine) Michael Boston, MD as Consulting Physician (General Surgery) Milus Banister, MD as Attending Physician (Gastroenterology) Janyth Contes, MD as Consulting Physician (Obstetrics and Gynecology) Nicholas Lose, MD as Consulting Physician (Hematology and Oncology)  DIAGNOSIS:  Encounter Diagnoses  Name Primary?   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     SUMMARY OF ONCOLOGIC HISTORY: Oncology History  Ductal carcinoma in situ (DCIS) of left breast  09/2012 Initial Diagnosis   Mammographically detected left breast abnormality biopsy-proven high-grade  DCIS ER/PR/ HER-2 negative status post lumpectomy (SLN neg) followed by radiation (in [place])   10/2012 - 2018 Anti-estrogen oral therapy   Tamoxifen 20 mg daily started in [place]    07/26/2016 Relapse/Recurrence   Left breast suspicious 0.9 cm group of linear branching calcifications, biopsy revealed DCIS with calcifications ER 0%, PR 0%   08/22/2016 Surgery   Left simple mastectomy: DCIS with necrosis and calcifications, grade 3, margins negative, 0/1 lymph node negative, ER 0%, PR 0% stage 0   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  10/21/2020 Initial Diagnosis   Colonoscopy: Cecal polyp: Biopsy: 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 with above-mentioned history of recurrent left breast DCIS who underwent a mastectomy and is currently on surveillance. She presents to the clinic for a follow-up. She reports that she has been doing fair. She says she is trying to adjust from the colon cancer. She has a jab of pain in her chest that feel like a harlie horse. She says when she belch it relax.     ALLERGIES:  is allergic to fentanyl, tetracyclines & related, other, sodium lactate, sucrose, clams [shellfish allergy], nsaids, pentazocine lactate, and tylenol [acetaminophen].  MEDICATIONS:  Current Outpatient Medications  Medication Sig Dispense Refill   albuterol (PROVENTIL HFA;VENTOLIN HFA) 108 (90 BASE) MCG/ACT inhaler Inhale 1 puff into the lungs every 6 (six) hours as needed for wheezing or shortness of breath (Asthma).     atorvastatin (LIPITOR) 20 MG tablet Take 20 mg by mouth daily before breakfast.     cetirizine (ZYRTEC ALLERGY) 10 MG tablet Take 1 tablet (10 mg total) by mouth daily. (Patient taking differently: Take 10 mg by mouth every evening.)     cholestyramine (QUESTRAN) 4 GM/DOSE powder Take 1 packet (4 g total) by mouth 2 (two) times daily with a meal. 378 g 1   Cyanocobalamin (B-12 PO) Take by mouth.     dicyclomine (BENTYL) 10 MG capsule Take 10 mg by mouth daily at 6 (six) AM. (1st thing in the morning)     diphenoxylate-atropine (LOMOTIL) 2.5-0.025 MG tablet Take 1 tablet by mouth 4 (four) times daily as needed for diarrhea or loose stools. 60 tablet 2   fluticasone (FLONASE) 50 MCG/ACT nasal spray Place 1 spray into both nostrils daily as needed.  lisinopril (ZESTRIL) 20 MG tablet Take 20 mg by mouth at bedtime.     LORazepam (ATIVAN) 0.5 MG tablet 1 tablet as needed Orally Once a day     Multiple Vitamins-Minerals (MULTIVITAMIN GUMMIES WOMENS PO) Take 1 tablet by mouth every evening. One-A-Day Women's  pill     Omega-3 Fatty Acids (FISH OIL) 1200 MG CAPS Take  1,200 mg by mouth every evening.     Polyethyl Glycol-Propyl Glycol (SYSTANE) 0.4-0.3 % SOLN Place 1 drop into both eyes daily as needed (dry eyes).     traMADol (ULTRAM) 50 MG tablet Take 1-2 tablets (50-100 mg total) by mouth every 6 (six) hours as needed for moderate pain (for pain.). 30 tablet 0   Current Facility-Administered Medications  Medication Dose Route Frequency Provider Last Rate Last Admin   0.9 %  sodium chloride infusion  500 mL Intravenous Once Mansouraty, Telford Nab., MD        PHYSICAL EXAMINATION: ECOG PERFORMANCE STATUS: 1 - Symptomatic but completely ambulatory  Vitals:   03/24/22 1537  BP: (!) 141/45  Pulse: 65  Resp: 18  Temp: 97.7 F (36.5 C)  SpO2: 98%   Filed Weights   03/24/22 1537  Weight: 195 lb 3 oz (88.5 kg)    BREAST: No palpable masses or nodules   (exam performed in the presence of a chaperone)  LABORATORY DATA:  I have reviewed the data as listed    Latest Ref Rng & Units 03/24/2022    3:04 PM 01/21/2022    9:11 AM 01/20/2021   12:13 PM  CMP  Glucose 70 - 99 mg/dL 83  103  100   BUN 8 - 23 mg/dL '17  20  9   '$ Creatinine 0.44 - 1.00 mg/dL 1.01  0.97  0.91   Sodium 135 - 145 mmol/L 141  141  141   Potassium 3.5 - 5.1 mmol/L 4.2  4.0  4.0   Chloride 98 - 111 mmol/L 107  105  104   CO2 22 - 32 mmol/L '27  28  27   '$ Calcium 8.9 - 10.3 mg/dL 9.6  9.5  9.5   Total Protein 6.5 - 8.1 g/dL 6.8  7.3  7.2   Total Bilirubin 0.3 - 1.2 mg/dL 0.5  0.6  0.5   Alkaline Phos 38 - 126 U/L 72  77  72   AST 15 - 41 U/L '21  22  24   '$ ALT 0 - 44 U/L '23  23  26     '$ Lab Results  Component Value Date   WBC 6.0 03/24/2022   HGB 13.0 03/24/2022   HCT 37.6 03/24/2022   MCV 96.2 03/24/2022   PLT 171 03/24/2022   NEUTROABS 3.5 03/24/2022    ASSESSMENT & PLAN:  Ductal carcinoma in situ (DCIS) of left breast Left breast DCIS ER/PR negative high-grade status post lumpectomy July 2014 took adjuvant tamoxifen since September 2014 discontinued May 2018.    Recurrence: 07/25/2016: Suspicious 0.9 cm group of calcifications lower inner quadrant left breast biopsy-proven to be ER/PR negative high-grade DCIS   08/22/2016: Left simple mastectomy: DCIS with necrosis and calcifications, grade 3, margins negative, 0/1 lymph node negative, ER 0%, PR 0% stage 0 ----------------------------------------------------------- Recommendation: No role of antiestrogen therapy   Breast cancer surveillance: 1.  Mammogram 09/06/2021 right breast: No evidence of malignancy.  Breast density category B 2.  Breast exam 03/24/22: Benign Her husband passed away in Feb 11, 2022  Return to clinic on an as-needed basis.  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 Rush Landmark  No orders of the defined types were placed in this encounter.  The patient has a good understanding of the overall plan. she agrees with it. she will call with any problems that may develop before the next visit here. Total time spent: 30 mins including face to face time and time spent for planning, charting and co-ordination of care   Harriette Ohara, MD 03/24/22    I Gardiner Coins am acting as a Education administrator for [employer]  I have reviewed the above documentation for accuracy and completeness, and I agree with the above.

## 2022-03-24 NOTE — Assessment & Plan Note (Addendum)
Left breast DCIS ER/PR negative high-grade status post lumpectomy July 2014 took adjuvant tamoxifen since September 2014 discontinued May 2018.   Recurrence: 07/25/2016: Suspicious 0.9 cm group of calcifications lower inner quadrant left breast biopsy-proven to be ER/PR negative high-grade DCIS   08/22/2016: Left simple mastectomy: DCIS with necrosis and calcifications, grade 3, margins negative, 0/1 lymph node negative, ER 0%, PR 0% stage 0 ----------------------------------------------------------- Recommendation: No role of antiestrogen therapy   Breast cancer surveillance: 1.  Mammogram 09/06/2021 right breast: No evidence of malignancy.  Breast density category B 2.  Breast exam 03/24/22: Benign Her husband passed away in 01/18/22  Return to clinic on an as-needed basis.

## 2022-03-25 LAB — CEA (ACCESS): CEA (CHCC): 4.64 ng/mL (ref 0.00–5.00)

## 2022-06-27 ENCOUNTER — Other Ambulatory Visit: Payer: Self-pay | Admitting: Gastroenterology

## 2022-08-16 ENCOUNTER — Telehealth: Payer: Self-pay | Admitting: Gastroenterology

## 2022-08-16 ENCOUNTER — Other Ambulatory Visit: Payer: Self-pay | Admitting: Internal Medicine

## 2022-08-16 DIAGNOSIS — Z1231 Encounter for screening mammogram for malignant neoplasm of breast: Secondary | ICD-10-CM

## 2022-08-16 NOTE — Telephone Encounter (Signed)
Cholestyramine has been working well for the most part. Patient states that her PCP recommends that she follow up with office to discuss possible changes with the medication. I will reach out to the pharmacist that is also recommending the change in medication to see if we can get more information. Patient gave me the contact phone number of [phone] and told me to ask for [place]. I will work on this tomorrow. Patient was scheduled for OV appt in Sept by our schedulers.

## 2022-08-16 NOTE — Telephone Encounter (Signed)
Inbound call from patient requesting a call back to discuss medication she is currently taking. States he PCP would like her to switch to different medication, requesting a call back to discuss this further. Please advise, thank you.

## 2022-09-01 NOTE — Telephone Encounter (Signed)
Called and left message x2 for [name] at [phone]. Left detailed message.

## 2022-09-06 ENCOUNTER — Ambulatory Visit
Admission: RE | Admit: 2022-09-06 | Discharge: 2022-09-06 | Disposition: A | Discharge status: Home / Self Care | Payer: Medicare Other | Source: Ambulatory Visit | Attending: Internal Medicine | Admitting: Internal Medicine

## 2022-09-06 DIAGNOSIS — Z1231 Encounter for screening mammogram for malignant neoplasm of breast: Secondary | ICD-10-CM

## 2022-09-06 NOTE — Telephone Encounter (Signed)
Melissa from [facility] called for you stated she needs additional information regarding your medication request. [phone].

## 2022-09-06 NOTE — Telephone Encounter (Signed)
Left message on voicemail.

## 2022-09-12 NOTE — Telephone Encounter (Signed)
Left message for Melissa at [facility].

## 2022-09-25 DIAGNOSIS — Z0289 Encounter for other administrative examinations: Secondary | ICD-10-CM

## 2022-09-26 ENCOUNTER — Ambulatory Visit (INDEPENDENT_AMBULATORY_CARE_PROVIDER_SITE_OTHER): Payer: Medicare Other | Admitting: Adult Health

## 2022-09-26 ENCOUNTER — Encounter (INDEPENDENT_AMBULATORY_CARE_PROVIDER_SITE_OTHER): Payer: Self-pay | Admitting: Adult Health

## 2022-09-26 VITALS — BP 139/71 | HR 69 | Ht 62.0 in | Wt 195.0 lb

## 2022-09-26 DIAGNOSIS — Z6835 Body mass index (BMI) 35.0-35.9, adult: Secondary | ICD-10-CM | POA: Diagnosis not present

## 2022-09-26 DIAGNOSIS — I1A0 Resistant hypertension: Secondary | ICD-10-CM

## 2022-09-26 NOTE — Progress Notes (Signed)
Office: [phone]  /  Fax: [phone]   Initial Visit  [facility] [place] was seen in clinic today to evaluate for obesity. She is interested in losing weight to improve overall health and reduce the risk of weight related complications. She presents today to review program treatment options, initial physical assessment, and evaluation.     She was referred by: PCP  When asked what else they would like to accomplish? She states: Adopt healthier eating patterns, Improve energy levels and physical activity, Improve existing medical conditions, Reduce number of medications, Reduce risk for a surgery, and Lose a target amount of weight : Current weight 195 lbs, goal weight 150 lbs  Weight history: She reports gaining weight after 1983- her husband retired from [place] [employer].  Her eating patterns changed and she became more sedentary.  When asked how has your weight affected you? She states: Has affected self-esteem, Relationships, Contributed to medical problems, Contributed to orthopedic problems or mobility issues, Having fatigue, Having poor endurance, Problems with eating patterns, and Has affected mood   Some associated conditions: Hypertension, Hyperlipidemia, and Kidney disease  Contributing factors: Family history, Stress, and Reduced physical activity  Weight promoting medications identified: Other: Ultram, Ativan  Current nutrition plan: None  Current level of physical activity: Other: Cuby  Current or previous pharmacotherapy: None  Response to medication: Never tried medications   Past medical history includes:   Past Medical History:  Diagnosis Date   Allergy    Anxiety 09/14/2020   Arthritis    hands, knees   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   Breast CA (HCC) 10/2012   Left Breast   Complication of anesthesia    during colonoscopy fentanyl dropped blood pressure 2013 or 2014   Diverticulitis 09/14/2020   no meds currently   Dyspnea 09/14/2020   on exertion can  climb flight of steps   Fatty liver 09/14/2020   GERD (gastroesophageal reflux disease)    Heart murmur 09/14/2020   mild no cardiologist   High cholesterol 09/14/2020   History of migraine    none since 1990's   Hypertension 09/14/2020   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  Peak Weight:211 , Body Fat%:45.8, Visceral Fat Rating:15, Weight trend over the last 12 months: Increasing  General:  Alert, oriented and cooperative. Patient is in no acute distress.  Respiratory: Normal respiratory effort, no problems with respiration noted   Gait: able to ambulate independently  Mental Status: Normal mood and affect. Normal behavior. Normal judgment and thought content.   DIAGNOSTIC DATA REVIEWED:  BMET    Component Value Date/Time   NA 141 03/24/2022 1504   K 4.2 03/24/2022 1504   CL 107 03/24/2022 1504   CO2 27 03/24/2022 1504   GLUCOSE 83 03/24/2022 1504   BUN 17 03/24/2022 1504   CREATININE 1.01 (H) 03/24/2022 1504   CALCIUM 9.6  03/24/2022 1504   GFRNONAA 57 (L) 03/24/2022 1504   GFRAA >60 08/19/2016 0625   Lab Results  Component Value Date   HGBA1C 6.4 (H) 11/25/2020   No results found for: "INSULIN" CBC    Component Value Date/Time   WBC 6.0 03/24/2022 1504   WBC 6.1 01/20/2021 1213   RBC 3.91 03/24/2022 1504   HGB 13.0 03/24/2022 1504   HCT 37.6 03/24/2022 1504   PLT 171 03/24/2022 1504   MCV 96.2 03/24/2022 1504   MCH 33.2 03/24/2022 1504   MCHC 34.6  03/24/2022 1504   RDW 12.2 03/24/2022 1504   Iron/TIBC/Ferritin/ %Sat No results found for: "IRON", "TIBC", "FERRITIN", "IRONPCTSAT" Lipid Panel  No results found for: "CHOL", "TRIG", "HDL", "CHOLHDL", "VLDL", "LDLCALC", "LDLDIRECT" Hepatic Function Panel     Component Value Date/Time   PROT 6.8 03/24/2022 1504   ALBUMIN 4.2 03/24/2022 1504   AST 21 03/24/2022 1504   ALT 23 03/24/2022 1504   ALKPHOS 72 03/24/2022 1504   BILITOT 0.5 03/24/2022 1504      Component Value Date/Time   TSH 2.78 01/21/2022 0911     Assessment and Plan:   Resistant hypertension  Morbid obesity (HCC), Starting BMI 35.66  ESTABLISH WITH HWW     Obesity Treatment / Action Plan:  Patient will work on garnering support from family and friends to begin weight loss journey. Will work on eliminating or reducing the presence of highly palatable, calorie dense foods in the home. Will complete provided nutritional and psychosocial assessment questionnaire before the next appointment. Will be scheduled for indirect calorimetry to determine resting energy expenditure in a fasting state.  This will allow [place] to create a reduced calorie, high-protein meal plan to promote loss of fat mass while preserving muscle mass. Counseled on the health benefits of losing 5%-15% of total body weight. Was counseled on nutritional approaches to weight loss and benefits of reducing processed foods and consuming plant-based foods and high quality protein as part of nutritional weight management. Was counseled on pharmacotherapy and role as an adjunct in weight management.   Obesity Education Performed Today:  She was weighed on the bioimpedance scale and results were discussed and documented in the synopsis.  We discussed obesity as a disease and the importance of a more detailed evaluation of all the factors contributing to the disease.  We discussed the importance of long term lifestyle changes which include nutrition,  exercise and behavioral modifications as well as the importance of customizing this to her specific health and social needs.  We discussed the benefits of reaching a healthier weight to alleviate the symptoms of existing conditions and reduce the risks of the biomechanical, metabolic and psychological effects of obesity.  [facility] appears to be in the action stage of change and states they are ready to start intensive lifestyle modifications and behavioral modifications.  30 minutes was spent today on this visit including the above counseling, pre-visit chart review, and post-visit documentation.  Reviewed by clinician on day of visit: allergies, medications, problem list, medical history, surgical history, family history, social history, and previous encounter notes pertinent to obesity diagnosis.   Wakeelah Solan d. Dayanne Yiu, NP-C

## 2022-10-03 NOTE — Telephone Encounter (Signed)
I have not be able to reach contact person to get further information. Patient is scheduled for follow-up office visit in Sept. Can discuss with patient and provider at that time.

## 2022-10-17 ENCOUNTER — Ambulatory Visit (INDEPENDENT_AMBULATORY_CARE_PROVIDER_SITE_OTHER): Payer: Medicare Other | Admitting: Family Medicine

## 2022-11-02 ENCOUNTER — Ambulatory Visit (INDEPENDENT_AMBULATORY_CARE_PROVIDER_SITE_OTHER): Payer: Medicare Other | Admitting: Family Medicine

## 2022-11-09 ENCOUNTER — Ambulatory Visit (INDEPENDENT_AMBULATORY_CARE_PROVIDER_SITE_OTHER): Payer: Medicare Other | Admitting: Gastroenterology

## 2022-11-09 ENCOUNTER — Other Ambulatory Visit (HOSPITAL_COMMUNITY): Payer: Self-pay

## 2022-11-09 ENCOUNTER — Encounter: Payer: Self-pay | Admitting: Gastroenterology

## 2022-11-09 VITALS — BP 142/68 | HR 68 | Ht 63.75 in | Wt 197.0 lb

## 2022-11-09 DIAGNOSIS — Z86010 Personal history of colonic polyps: Secondary | ICD-10-CM | POA: Diagnosis not present

## 2022-11-09 DIAGNOSIS — K529 Noninfective gastroenteritis and colitis, unspecified: Secondary | ICD-10-CM

## 2022-11-09 DIAGNOSIS — K9089 Other intestinal malabsorption: Secondary | ICD-10-CM

## 2022-11-09 DIAGNOSIS — Z85038 Personal history of other malignant neoplasm of large intestine: Secondary | ICD-10-CM | POA: Diagnosis not present

## 2022-11-09 MED ORDER — COLESEVELAM HCL 625 MG PO TABS: 625.0000 mg | ORAL_TABLET | Freq: Two times a day (BID) | ORAL | 1 refills | Status: DC

## 2022-11-09 NOTE — Progress Notes (Signed)
GASTROENTEROLOGY OUTPATIENT CLINIC VISIT   Primary Care Provider Alysia Penna, MD [address] [place] [address] [phone]  Patient Profile: Colleen Hancock is a 77 y.o. female with a pmh significant for hypertension, hyperlipidemia, prior breast cancer, colon cancer (status post right hemicolectomy), IBS-D, status post cholecystectomy (on cholestyramine), colon polyps (TA's).  The patient presents to the [facility] for an evaluation and management of problem(s) noted below:  Problem List 1. Chronic diarrhea   2. Bile salt-induced diarrhea   3. History of colon cancer   4. Hx of adenomatous colonic polyps      History of Present Illness Please see prior GI notes for full details of HPI.  Interval History The patient is seen in follow-up with her daughter.  The patient has initiated Lomotil on a daily basis and has noted a significant improvement in the overall number of looser bowel movements that she is experiencing.  There are days where she will still have multiple loose bowel movements but she feels that things are better than prior.  As previously noted when she tried to increase her cholestyramine further she began to have issues with a rash development.  She is hopeful to not have this interaction.  She has not noted any blood in her stools or progressive abdominal pain or discomfort.  Her colonoscopy did not show evidence of lymphocytic or collagenous colitis.  GI Review of Systems Positive as above Negative for dysphagia, odynophagia, nausea, vomiting, melena, hematochezia  Review of Systems General: Denies fevers/chills/weight loss unintentionally Cardiovascular: Denies chest pain Pulmonary: Denies shortness of breath Gastroenterological: See HPI Genitourinary: Denies darkened urine Hematological: Denies easy bruising/bleeding Dermatological: Denies jaundice Psychological: Mood is stable   Medications Current Outpatient  Medications  Medication Sig Dispense Refill   albuterol (PROVENTIL HFA;VENTOLIN HFA) 108 (90 BASE) MCG/ACT inhaler Inhale 1 puff into the lungs every 6 (six) hours as needed for wheezing or shortness of breath (Asthma).     Apoaequorin (PREVAGEN) 10 MG CAPS Take one tablet by mouth daily Oral     atorvastatin (LIPITOR) 20 MG tablet Take 20 mg by mouth daily before breakfast.     cetirizine (ZYRTEC ALLERGY) 10 MG tablet Take 1 tablet (10 mg total) by mouth daily. (Patient taking differently: Take 10 mg by mouth every evening.)     colesevelam (WELCHOL) 625 MG tablet Take 1 tablet (625 mg total) by mouth 2 (two) times daily with a meal. 90 tablet 1   Cyanocobalamin (B-12 PO) Take by mouth.     dicyclomine (BENTYL) 10 MG capsule Take 10 mg by mouth daily at 6 (six) AM. (1st thing in the morning)     diphenoxylate-atropine (LOMOTIL) 2.5-0.025 MG tablet Take 1 tablet by mouth 4 (four) times daily as needed for diarrhea or loose stools. 60 tablet 2   fluticasone (FLONASE) 50 MCG/ACT nasal spray Place 1 spray into both nostrils daily as needed.     lisinopril (ZESTRIL) 20 MG tablet Take 20 mg by mouth at bedtime.     LORazepam (ATIVAN) 1 MG tablet      Multiple Vitamins-Minerals (MULTIVITAMIN GUMMIES WOMENS PO) Take 1 tablet by mouth every evening. One-A-Day Women's  pill     Omega-3 Fatty Acids (FISH OIL) 1200 MG CAPS Take 1,200 mg by mouth every evening.     Polyethyl Glycol-Propyl Glycol (SYSTANE) 0.4-0.3 % SOLN Place 1 drop into both eyes daily as needed (dry eyes).     traMADol (ULTRAM) 50 MG tablet Take 1-2 tablets (  50-100 mg total) by mouth every 6 (six) hours as needed for moderate pain (for pain.). 30 tablet 0   triamcinolone cream (KENALOG) 0.1 % Apply 1 Application topically 2 (two) times daily.     No current facility-administered medications for this visit.    Allergies Allergies  Allergen Reactions   Fentanyl Other (See Comments)    Low blood pressure.   Tetracyclines & Related  Hives   Other     SEAFOOD > UNSPECIFIED REACTION      Sodium Lactate Diarrhea   Sucrose     Artificial sugars   Clams [Shellfish Allergy] Diarrhea   Nsaids Nausea And Vomiting    Stomach upset /had hx of stomach ulcers   Pentazocine Lactate Nausea And Vomiting    Talwin n/v tunnel vision   Tylenol [Acetaminophen] Other (See Comments)    Worried may cause liver problems - 3g/day max    Histories Past Medical History:  Diagnosis Date   Allergy    Anxiety 09/14/2020   Arthritis    hands, knees   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   Breast CA (HCC) 10/2012   Left Breast   Complication of anesthesia    during colonoscopy fentanyl dropped blood pressure 2013 or 2014   Diverticulitis 09/14/2020   no meds currently   Dyspnea 09/14/2020   on exertion can climb flight of steps   Fatty liver 09/14/2020   GERD (gastroesophageal reflux disease)    Heart murmur 09/14/2020   mild no cardiologist   High cholesterol 09/14/2020   History of migraine    none since 1990's   Hypertension 09/14/2020   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   with diarrhea   Malignant neoplasm of female breast (HCC) 03/07/2012   Pneumonia    yrs ago per pt on 09-14-2020   Swelling of joint of left shoulder 09/14/2020   swelling under left shoulder, 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   Thickened endometrium 09/14/2020   Wears glasses 09/14/2020   Past Surgical History:  Procedure Laterality Date   APPENDECTOMY  12/10/2020   BREAST BIOPSY Left 10/2012   Stereo- Malignant   BREAST BIOPSY Left    U/S Core- Benign   BREAST EXCISIONAL BIOPSY Left    BREAST EXCISIONAL BIOPSY Left    BREAST LUMPECTOMY Left 11/2012   radiation tx done x 34 treatments   CESAREAN SECTION  1972   CHOLECYSTECTOMY  2010   laparoscopic   COLON RESECTION  12/10/2020   COLONOSCOPY     2013 or 2014   DILATION AND CURETTAGE OF UTERUS  1967   HYSTEROSCOPY WITH D & C N/A 09/18/2020   Procedure:  DILATATION AND CURETTAGE /HYSTEROSCOPY/ POLYPECTOMY;  Surgeon: Sherian Rein, MD;  Location: [facility];  Service: Gynecology;  Laterality: N/A;   MASTECTOMY Left 2018   MASTECTOMY W/ SENTINEL NODE BIOPSY Left 08/18/2016   MASTECTOMY W/ SENTINEL NODE BIOPSY Left 08/18/2016   Procedure: LEFT TOTAL MASTECTOMY WITH SENTINEL LYMPH NODE BIOPSY WITH BLUE DYE INJECTION;  Surgeon: Emelia Loron, MD;  Location: [facility] OR;  Service: General;  Laterality: Left;   SKIN CANCER EXCISION Left 2009   eye brow   Social History   Socioeconomic History   Marital status: Widowed    Spouse name: Not on file   Number of children: 2   Years of education: Not on file   Highest education level: Not on file  Occupational History   Occupation: retired  Tobacco Use  Smoking status: Never   Smokeless tobacco: Never  Vaping Use   Vaping status: Never Used  Substance and Sexual Activity   Alcohol use: Yes    Comment: 1 glass wine at new years only   Drug use: No   Sexual activity: Not on file  Other Topics Concern   Not on file  Social History Narrative   Not on file   Social Determinants of Health   Financial Resource Strain: Not on file  Food Insecurity: Not on file  Transportation Needs: Not on file  Physical Activity: Not on file  Stress: Not on file  Social Connections: Not on file  Intimate Partner Violence: Not on file   Family History  Problem Relation Age of Onset   Alzheimer's disease Mother    Heart attack Father    Heart disease Father    Multiple sclerosis Brother    Epilepsy Brother    Alzheimer's disease Brother    Hypertension Brother    Heart disease Brother    Hyperlipidemia Brother    Kidney Stones Brother    Breast cancer Maternal Aunt    Hodgkin's lymphoma Son    Heart disease Son    Diabetes Son    Breast cancer Cousin    Breast cancer Cousin    Breast cancer Cousin    Heart disease Other    Colon polyps Neg Hx    Colon cancer Neg Hx     Esophageal cancer Neg Hx    Rectal cancer Neg Hx    Stomach cancer Neg Hx    Inflammatory bowel disease Neg Hx    Liver disease Neg Hx    Pancreatic cancer Neg Hx    I have reviewed her medical, social, and family history in detail and updated the electronic medical record as necessary.    PHYSICAL EXAMINATION  BP (!) 142/68   Pulse 68   Ht 5' 3.75" (1.619 m)   Wt 197 lb (89.4 kg)   SpO2 94%   BMI 34.08 kg/m  Wt Readings from Last 3 Encounters:  11/09/22 197 lb (89.4 kg)  09/26/22 195 lb (88.5 kg)  03/24/22 195 lb 3 oz (88.5 kg)  GEN: NAD, appears younger than stated age, doesn't appear chronically ill, accompanied by daughter PSYCH: Cooperative, without pressured speech EYE: Conjunctivae pink, sclerae anicteric ENT: MMM CV: Nontachycardic RESP: No audible wheezing GI: NABS, soft, NT/ND, surgical scars present, without rebound or guarding MSK/EXT: Trace bilateral pedal edema SKIN: No jaundice NEURO:  Alert & Oriented x 3, no focal deficits   REVIEW OF DATA  I reviewed the following data at the time of this encounter:  GI Procedures and Studies  2023 colonoscopy - Stool in the entire examined colon. - Patent end-to-side ileo-colonic anastomosis, characterized by healthy appearing mucosa and an intact staple line (staples removed). - The examined portion of the ileum was normal - One 4 mm polyp in the transverse colon, removed with a cold snare. Resected and retrieved. - Diverticulosis in the recto-sigmoid colon, in the sigmoid colon and in the descending colon. - Normal mucosa in the entire examined colon. Biopsied. - Non-bleeding non-thrombosed internal hemorrhoids.  Diagnosis 1. Surgical [P], random colon - COLONIC MUCOSA WITH NO SPECIFIC HISTOPATHOLOGIC CHANGES - NEGATIVE FOR ACUTE INFLAMMATION, INCREASED INTRAEPITHELIAL LYMPHOCYTES OR THICKENED SUBEPITHELIAL COLLAGEN TABLE 2. Surgical [P], colon, transverse, polyp (1) - TUBULAR ADENOMA - NEGATIVE FOR HIGH-GRADE  DYSPLASIA OR MALIGNANCY  Laboratory Studies  Reviewed those in epic  Imaging Studies  No  new imaging to review   ASSESSMENT  Ms. Totman is a 77 y.o. female with a pmh significant for hypertension, hyperlipidemia, prior breast cancer, colon cancer (status post right hemicolectomy), IBS-D, status post cholecystectomy (on cholestyramine), colon polyps (TA's).  The patient is seen today for evaluation and management of:  1. Chronic diarrhea   2. Bile salt-induced diarrhea   3. History of colon cancer   4. Hx of adenomatous colonic polyps    The patient is hemodynamically stable.  Clinically, she is improved somewhat by initiation of Lomotil.  I told her that she could also increase the Lomotil dosing further if she wanted to but she is worried about getting constipated.  I am going to give her an opportunity to trial colestipol and see if we can titrate that upwards rather than cholestyramine and see if she can have further improvement.  She did have a rash with the increasing dosing of cholestyramine so she may not be able to tolerate the colestipol either.  If she does tolerate the colestipol will transition cholestyramine to that.  If she does not then we will go back to cholestyramine and keep her on her current dosing.  She will need a repeat colonoscopy in 3 years (pending her overall health) for colon cancer surveillance and the finding of adenomatous colon polyps on this recent colonoscopy.  We are going to hold off on SIBO breath testing for now.  The patient and/or family is agreeable to proceed.  All patient questions were answered to the best of my ability, and the patient agrees to the aforementioned plan of action with follow-up as indicated.   PLAN  Consider SIBO breath testing in future Consider empiric IBS-D Xifaxan treatment in future Colonoscopy for surveillance in 2026 Prescription for WelChol 1 tablet twice daily to see if she may be able to get any further improvement in  her symptoms of potential bile salt diarrhea - If no improvement will go back to cholestyramine 4 g daily --She developed a rash when she had higher doses of cholestyramine May continue to use Lomotil 1-3 times daily as needed - She wants her PCP to continue to prescribe this for her Consider changing Metamucil to Benefiber Patient and daughter will update [place] based on how she does with the WelChol in a few weeks   No orders of the defined types were placed in this encounter.   New Prescriptions   COLESEVELAM (WELCHOL) 625 MG TABLET    Take 1 tablet (625 mg total) by mouth 2 (two) times daily with a meal.   Modified Medications   No medications on file    Planned Follow Up No follow-ups on file.   Total Time in Face-to-Face and in Coordination of Care for patient including independent/personal interpretation/review of prior testing, medical history, examination, medication adjustment, communicating results with the patient directly, and documentation within the EHR is 25 minutes.   Corliss Parish, MD [facility] Office # [phone]

## 2022-11-09 NOTE — Patient Instructions (Addendum)
Stop Cholestyramine.  We have sent the following medications to your pharmacy for you to pick up at your convenience: Welchol )Colesevelam)  - Take 1-2 tablet by mouth daily.   _______________________________________________________  If your blood pressure at your visit was 140/90 or greater, please contact your primary care physician to follow up on this.  _______________________________________________________  If you are age 78 or older, your body mass index should be between 23-30. Your Body mass index is 34.08 kg/m. If this is out of the aforementioned range listed, please consider follow up with your Primary Care Provider.  If you are age 105 or younger, your body mass index should be between 19-25. Your Body mass index is 34.08 kg/m. If this is out of the aformentioned range listed, please consider follow up with your Primary Care Provider.   ________________________________________________________  The [facility] providers would like to encourage you to use [facility] to communicate with providers for non-urgent requests or questions.  Due to long hold times on the telephone, sending your provider a message by [facility] may be a faster and more efficient way to get a response.  Please allow 48 business hours for a response.  Please remember that this is for non-urgent requests.  _______________________________________________________  Due to recent changes in healthcare laws, you may see the results of your imaging and laboratory studies on MyChart before your provider has had a chance to review them.  We understand that in some cases there may be results that are confusing or concerning to you. Not all laboratory results come back in the same time frame and the provider may be waiting for multiple results in order to interpret others.  Please give [place] 48 hours in order for your provider to thoroughly review all the results before contacting the office for clarification of your results.    Thank you for choosing me and [facility].  Dr. Meridee Score

## 2022-11-13 ENCOUNTER — Encounter: Payer: Self-pay | Admitting: Gastroenterology

## 2022-11-13 DIAGNOSIS — Z86010 Personal history of colonic polyps: Secondary | ICD-10-CM | POA: Insufficient documentation

## 2022-11-14 ENCOUNTER — Other Ambulatory Visit: Payer: Self-pay | Admitting: Gastroenterology

## 2023-01-16 ENCOUNTER — Telehealth: Payer: Self-pay | Admitting: Gastroenterology

## 2023-01-16 MED ORDER — DIPHENOXYLATE-ATROPINE 2.5-0.025 MG PO TABS: 1.0000 | ORAL_TABLET | Freq: Four times a day (QID) | ORAL | 0 refills | Status: DC | PRN

## 2023-01-16 NOTE — Telephone Encounter (Signed)
Inbound call from patient requesting refill for Lomotil to [facility] on [address].

## 2023-01-16 NOTE — Telephone Encounter (Signed)
Refill for Lomotil sent to [facility].

## 2023-02-17 ENCOUNTER — Other Ambulatory Visit: Payer: Self-pay | Admitting: Gastroenterology

## 2023-03-08 ENCOUNTER — Other Ambulatory Visit: Payer: Self-pay | Admitting: Gastroenterology

## 2023-03-09 ENCOUNTER — Other Ambulatory Visit: Payer: Self-pay

## 2023-03-09 MED ORDER — DIPHENOXYLATE-ATROPINE 2.5-0.025 MG PO TABS: 1.0000 | ORAL_TABLET | Freq: Four times a day (QID) | ORAL | 0 refills | Status: DC | PRN

## 2023-03-09 NOTE — Telephone Encounter (Signed)
 Refill sent to [facility].

## 2023-03-25 IMAGING — MG MM DIGITAL DIAGNOSTIC UNILAT*R* W/ TOMO W/ CAD
6 series · 6 of 18 positions shown · non-contrast
Comparison: Previous exam(s).

CLINICAL DATA: Left mastectomy. Annual mammography. Tenderness
under the left axilla.

EXAM:
DIGITAL DIAGNOSTIC UNILATERAL RIGHT MAMMOGRAM WITH TOMOSYNTHESIS AND
CAD; US AXILLARY LEFT
TECHNIQUE: Right digital diagnostic mammography and breast tomosynthesis was
performed. The images were evaluated with computer-aided detection.;
Targeted ultrasound examination of the left axilla was performed.

[R CC synth-2D (1 of 2)]
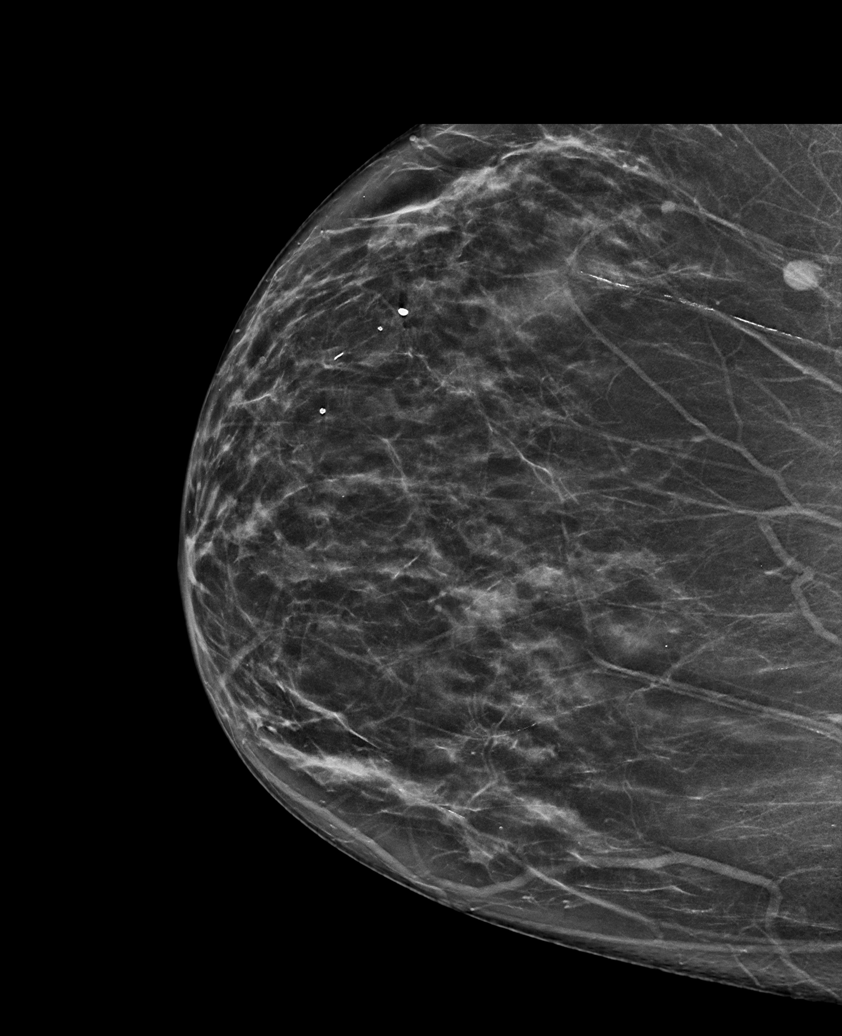

[R MLO synth-2D]
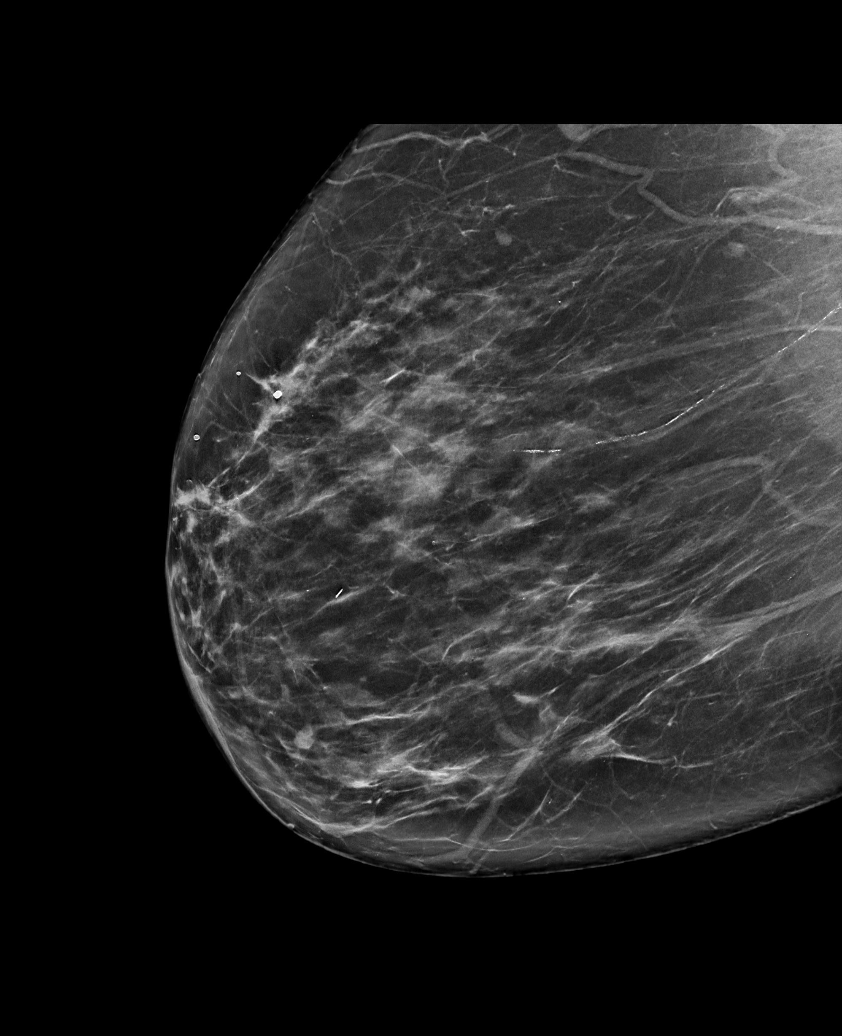

[R CC synth-2D (2 of 2)]
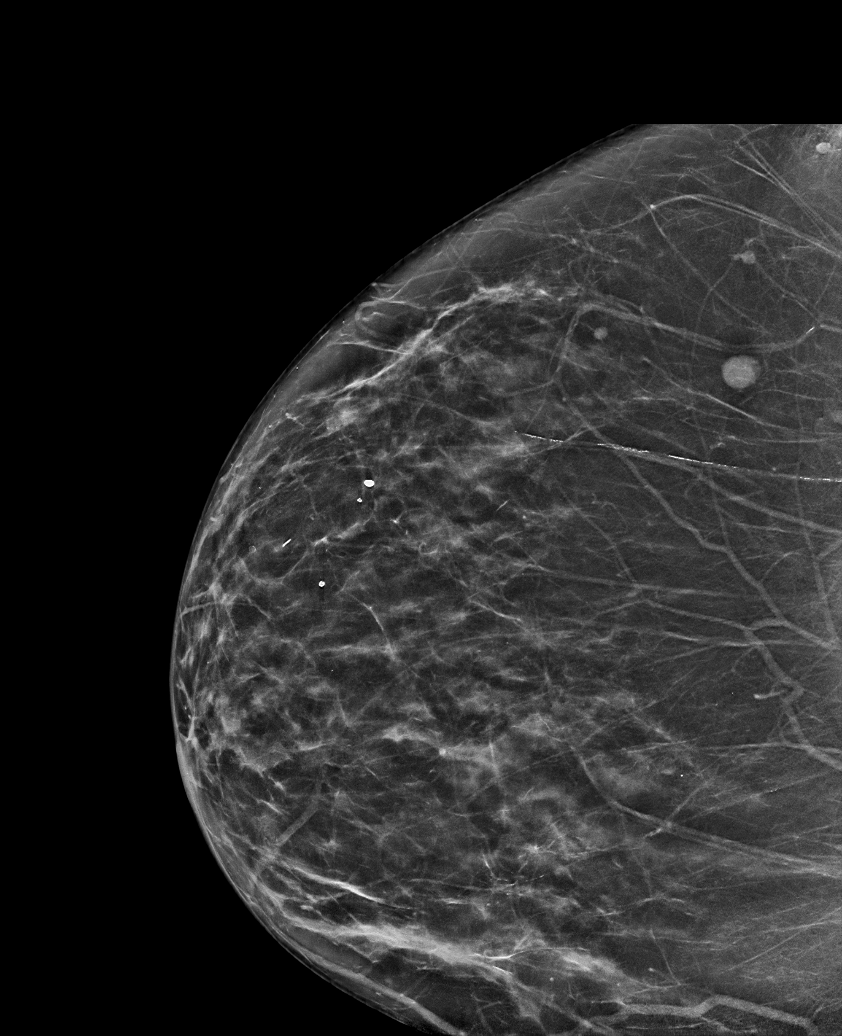

[R MLO tomo · tomo slice 44/87.0]
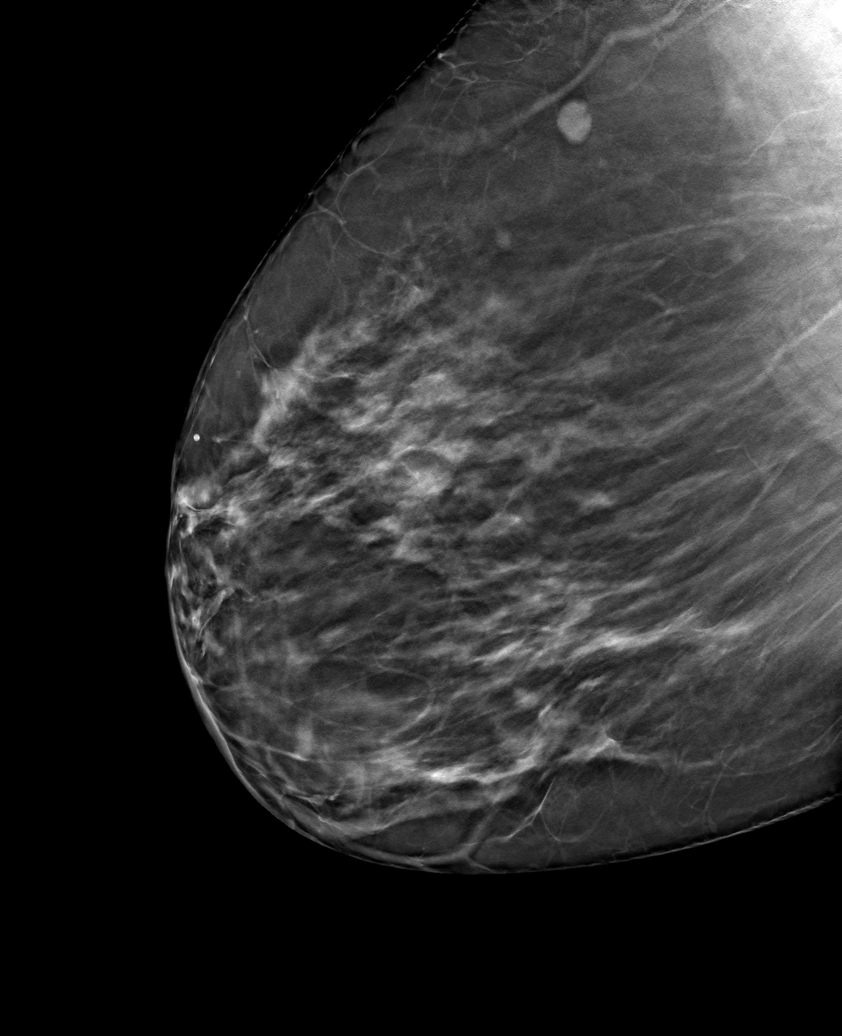

[R CC tomo (1 of 2) · tomo slice 39/76.0]
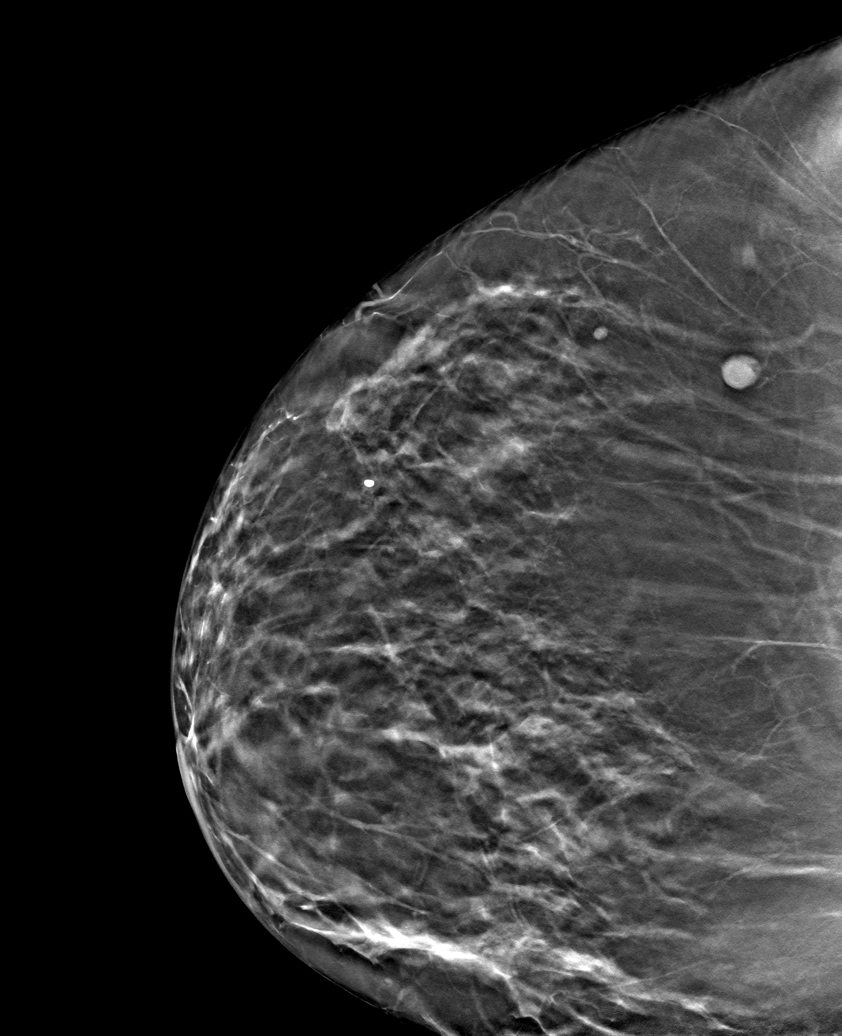

[R CC tomo (2 of 2) · tomo slice 37/72.0]
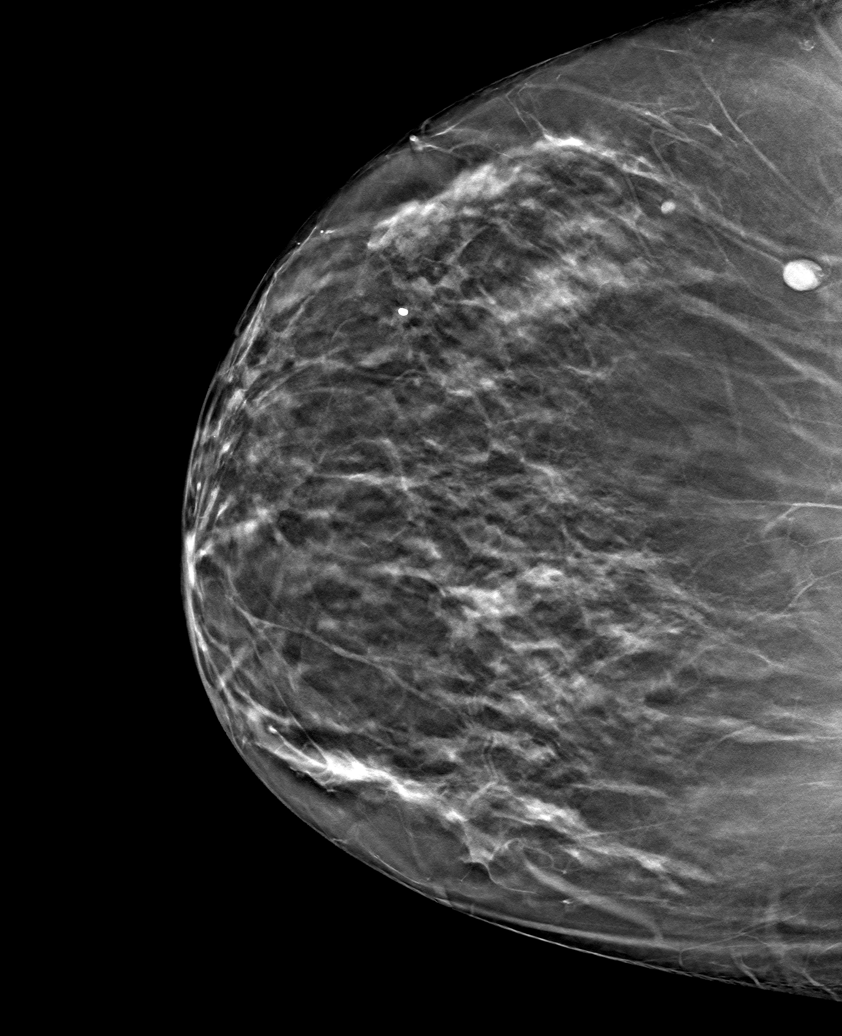

[6 of 18 positions shown; findings below may reference images not displayed]

ACR Breast Density Category b: There are scattered areas of
fibroglandular density.
FINDINGS: No suspicious masses, calcifications, or distortion are seen in the
right breast.

On physical exam, no suspicious lumps are identified.

Targeted ultrasound is performed, showing no abnormalities in the
left axilla correlate with the patient's tenderness.
IMPRESSION: No mammographic or sonographic evidence of malignancy.

RECOMMENDATION:
Treatment of the patient's intermittent left axillary tendinous
should be based on clinical and physical exam given lack of imaging
findings. Recommend annual screening mammography.

I have discussed the findings and recommendations with the patient.
If applicable, a reminder letter will be sent to the patient
regarding the next appointment.

BI-RADS CATEGORY  1: Negative.

## 2023-05-18 ENCOUNTER — Telehealth: Payer: Self-pay | Admitting: Gastroenterology

## 2023-05-18 MED ORDER — DIPHENOXYLATE-ATROPINE 2.5-0.025 MG PO TABS: 1.0000 | ORAL_TABLET | Freq: Four times a day (QID) | ORAL | 0 refills | Status: DC | PRN

## 2023-05-18 NOTE — Addendum Note (Signed)
 Addended byLucky Rathke on: 05/18/2023 11:32 AM   Modules accepted: Orders

## 2023-05-18 NOTE — Telephone Encounter (Signed)
 Inbound call from patient requesting a refill for Lomotil. States pharmacy advised that request for refill was denied. States when she takes medication once a day with water it helps with her diarrhea and would like for prescription to be refilled. Please advise, thank you.

## 2023-05-18 NOTE — Telephone Encounter (Addendum)
 Refill has been faxed to pharmacy for patient. Patient has been informed.

## 2023-05-30 IMAGING — CT CT CHEST-ABD-PELV W/ CM
3 of 5 series · 15 of 36 positions shown, 17 images · IV contrast (omnipaque)
Comparison: None.

CLINICAL DATA: Patient with newly diagnosed colon cancer at the
appendiceal orifice. History of breast cancer.

EXAM:
CT CHEST, ABDOMEN, AND PELVIS WITH CONTRAST
TECHNIQUE: Multidetector CT imaging of the chest, abdomen and pelvis was
performed following the standard protocol during bolus
administration of intravenous contrast.
CONTRAST:  75mL OMNIPAQUE IOHEXOL 350 MG/ML SOLN

[Series 3: cap with · axial · 0.82mm/px · z∈[-542,-72]mm · 9 of 118 slices shown, 11 images]
[im 12/118  mediastinal]
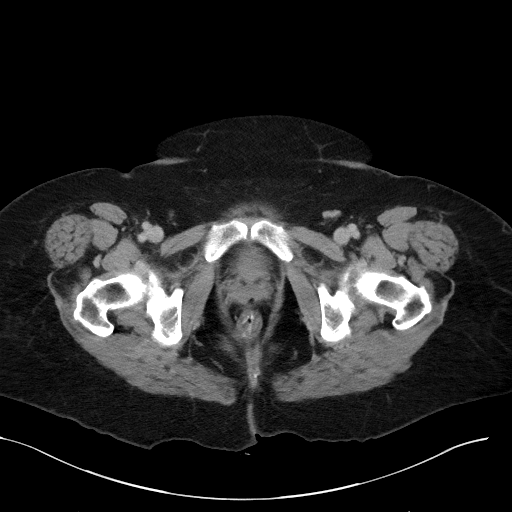
[im 12/118  bone]
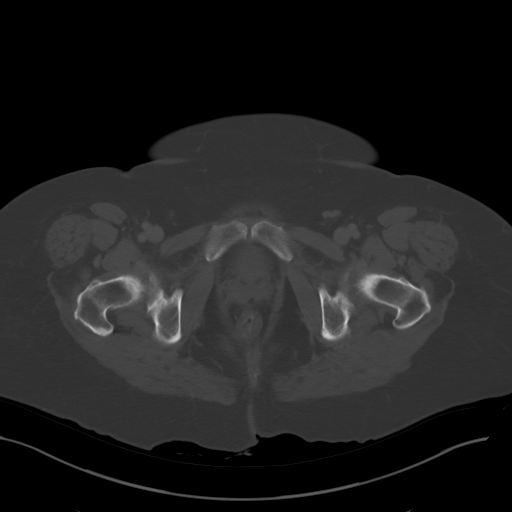
[im 24/118  mediastinal]
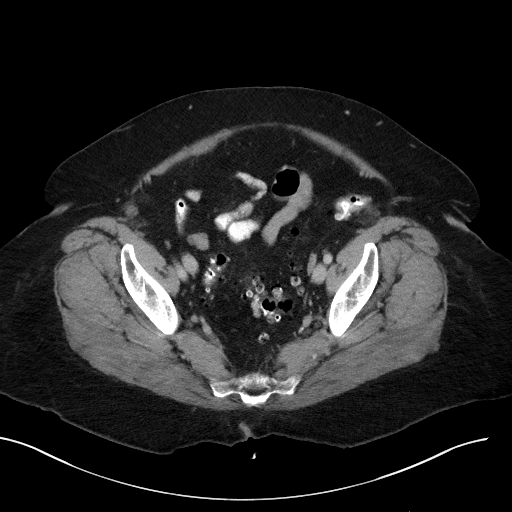
[im 36/118  mediastinal]
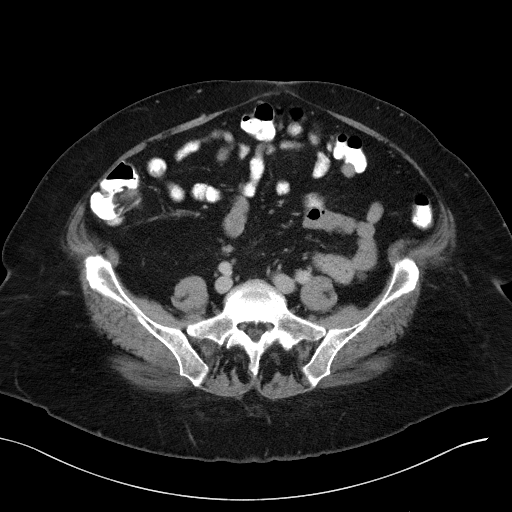
[im 47/118  mediastinal]
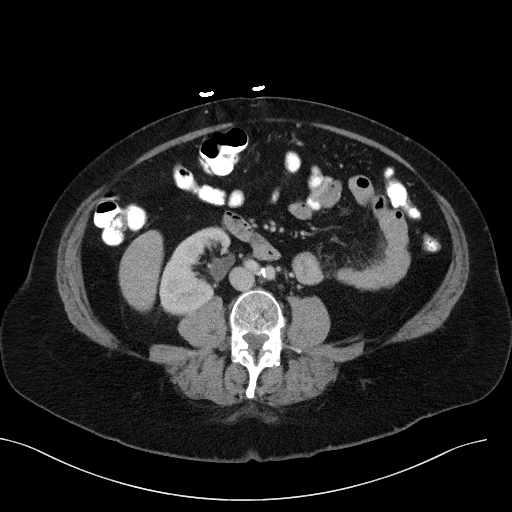
[im 59/118  mediastinal]
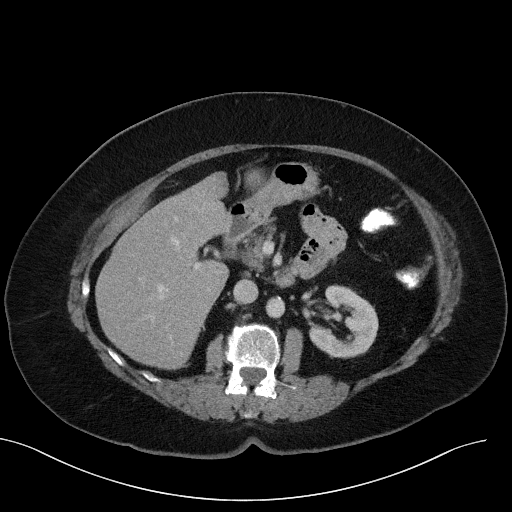
[im 71/118  mediastinal]
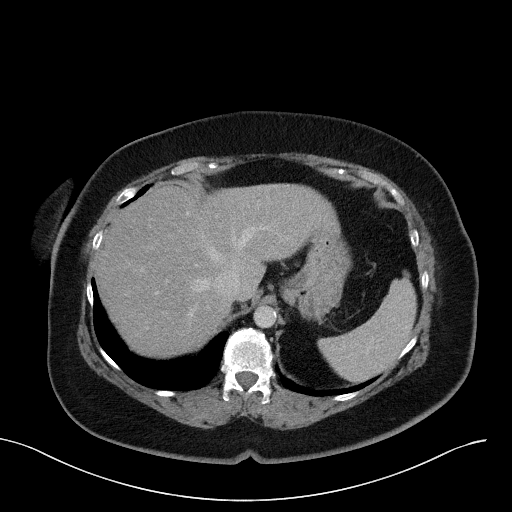
[im 82/118  mediastinal]
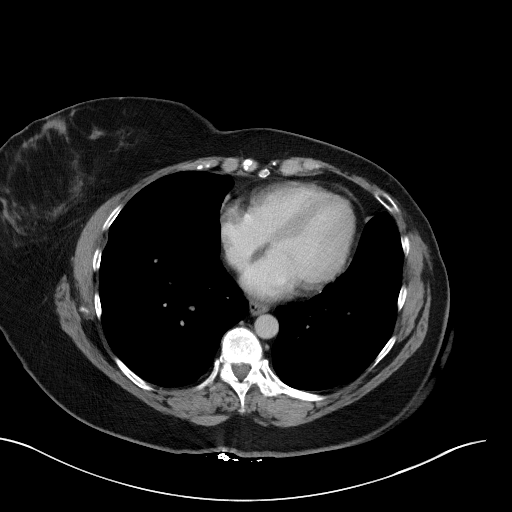
[im 94/118  mediastinal]
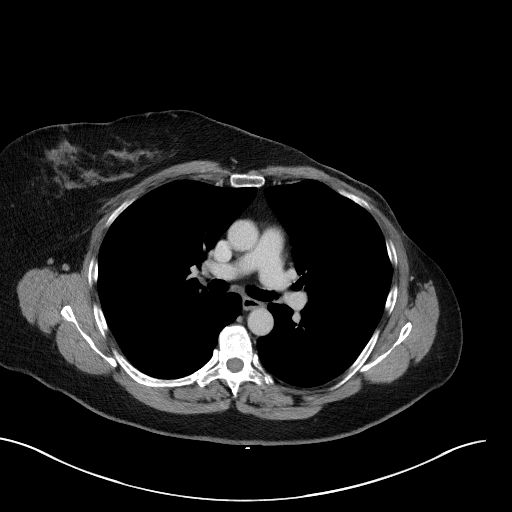
[im 106/118  mediastinal]
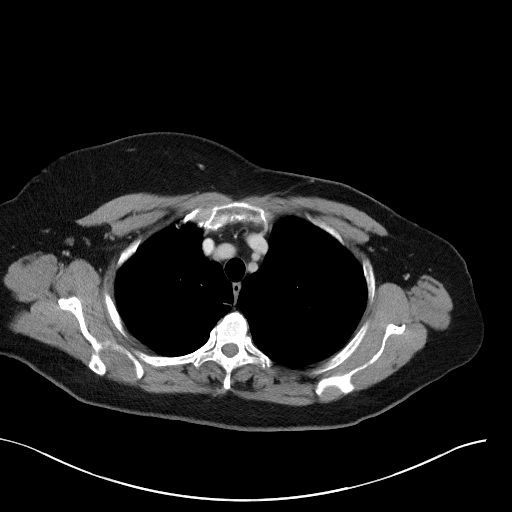
[im 106/118  bone]
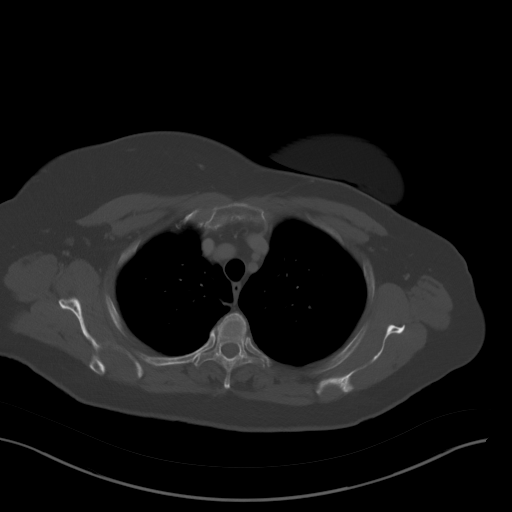

[Series 5: lung · axial · 0.73mm/px · z∈[-288,-220]mm · 3 of 150 slices shown]
[im 12/150  bone]
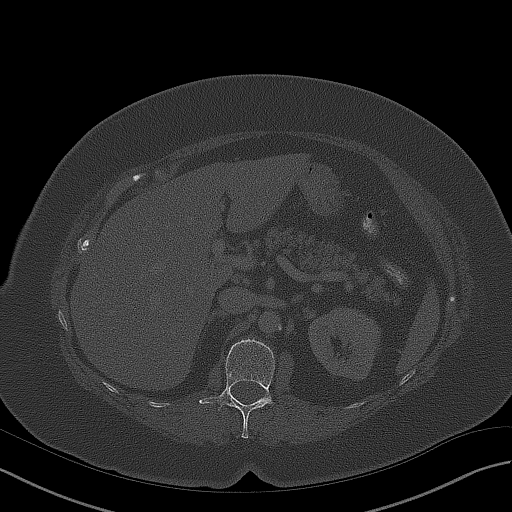
[im 35/150  bone]
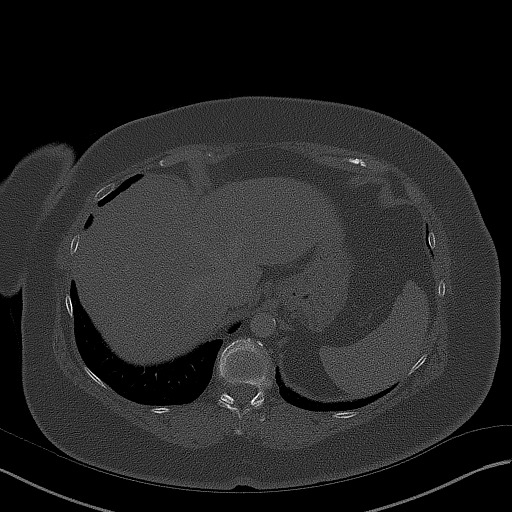
[im 46/150  bone]
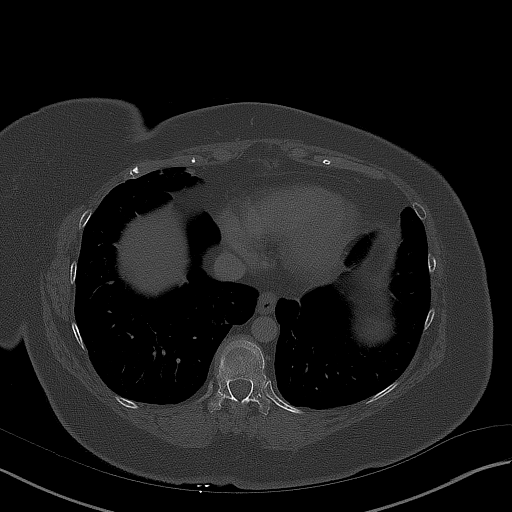

[Series 6: coronals · coronal · 0.81mm/px · 3 of 150 slices shown]
[im 30/150  mediastinal]
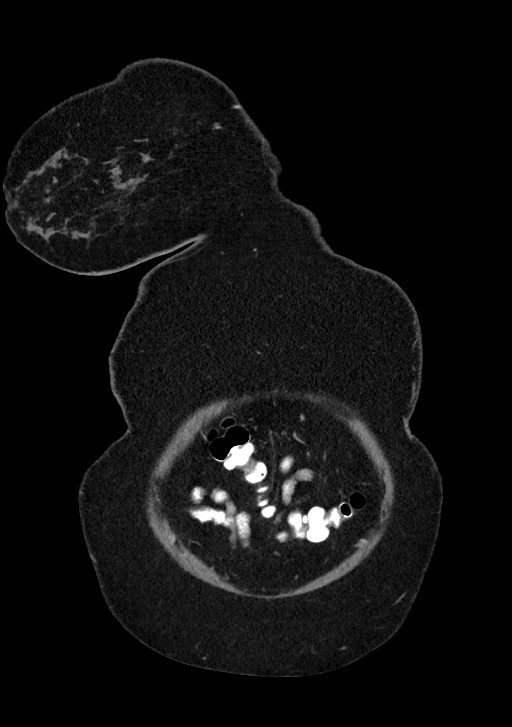
[im 60/150  mediastinal]
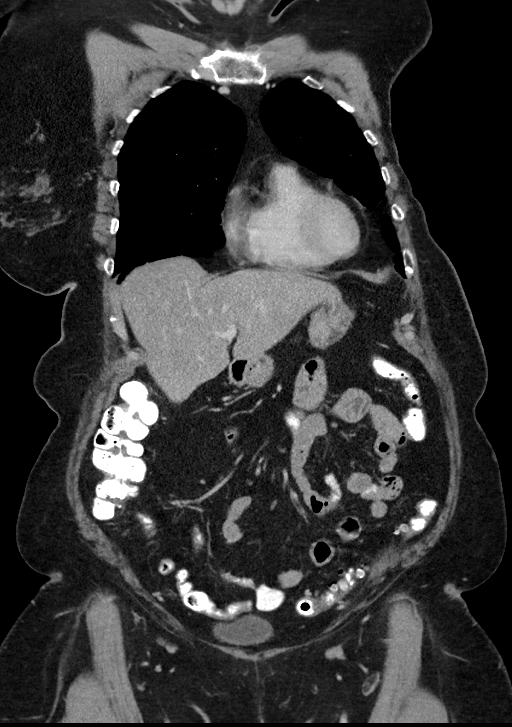
[im 90/150  mediastinal]
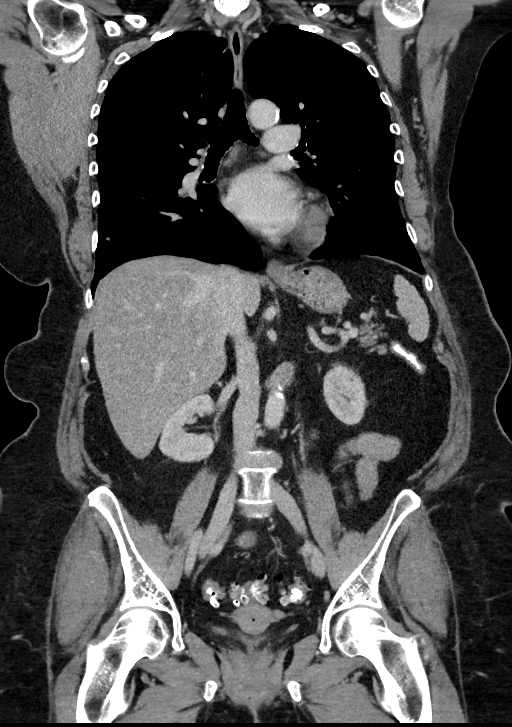

[15 of 36 positions shown; findings below may reference images not displayed]

FINDINGS: CT CHEST FINDINGS

Cardiovascular: Normal heart size. Aorta and main pulmonary artery
normal in caliber. Thoracic aortic vascular calcifications.

Mediastinum/Nodes: No enlarged axillary, mediastinal or hilar
lymphadenopathy. Normal appearance of the esophagus.

Lungs/Pleura: Central airways are patent. There is a 5 mm nodule
within the right lower lobe (image 98; series 5). 2 mm nodule within
the peripheral superior right lower lobe (image 70; series 5).
Bandlike scarring within the right lung apex (image 23; series 5).
No pleural effusion or pneumothorax.

Musculoskeletal: Thoracic spine degenerative changes. No aggressive
or acute appearing osseous lesions. Prior left mastectomy.

CT ABDOMEN PELVIS FINDINGS

Hepatobiliary: Liver is normal in size and contour. No focal hepatic
lesion is identified. Mild fatty deposition adjacent to the
falciform ligament. No intrahepatic or extrahepatic biliary ductal
dilatation.

Pancreas: Mild fatty atrophy of the pancreatic parenchyma.

Spleen: Unremarkable

Adrenals/Urinary Tract: Normal adrenal glands. Kidneys enhance
symmetrically with contrast. No hydronephrosis. Urinary bladder is
unremarkable.

Stomach/Bowel: Oral contrast material throughout the small and large
bowel. Sigmoid colonic diverticulosis. No CT evidence for acute
diverticulitis. No evidence for bowel obstruction. No free fluid or
free intraperitoneal air. Normal appendix. No large mass identified
involving the cecum.

Vascular/Lymphatic: Normal caliber abdominal aorta. Peripheral
calcified atherosclerotic plaque. No retroperitoneal
lymphadenopathy.

Reproductive: Uterus and adnexal structures are unremarkable.

Other: None.

Musculoskeletal: Lumbar spine degenerative changes. No aggressive or
acute appearing osseous lesions.
IMPRESSION: There is an indeterminate 5 mm nodule and 2 mm nodule within the
right lower lobe.

Otherwise no CT evidence for metastasis within the chest, abdomen or
pelvis.

## 2023-10-10 ENCOUNTER — Other Ambulatory Visit: Payer: Self-pay | Admitting: Internal Medicine

## 2023-10-10 DIAGNOSIS — Z1231 Encounter for screening mammogram for malignant neoplasm of breast: Secondary | ICD-10-CM

## 2023-10-11 ENCOUNTER — Other Ambulatory Visit: Payer: Self-pay | Admitting: Internal Medicine

## 2023-10-11 DIAGNOSIS — N63 Unspecified lump in unspecified breast: Secondary | ICD-10-CM

## 2023-10-12 ENCOUNTER — Other Ambulatory Visit: Payer: Self-pay | Admitting: Internal Medicine

## 2023-10-12 DIAGNOSIS — Z1239 Encounter for other screening for malignant neoplasm of breast: Secondary | ICD-10-CM

## 2023-10-12 DIAGNOSIS — Z853 Personal history of malignant neoplasm of breast: Secondary | ICD-10-CM

## 2023-10-12 DIAGNOSIS — Z1231 Encounter for screening mammogram for malignant neoplasm of breast: Secondary | ICD-10-CM

## 2023-10-12 DIAGNOSIS — N63 Unspecified lump in unspecified breast: Secondary | ICD-10-CM

## 2023-10-25 ENCOUNTER — Other Ambulatory Visit: Payer: Self-pay | Admitting: Gastroenterology

## 2023-10-26 NOTE — Telephone Encounter (Signed)
 Patient requesting f/u call in regards to lomotil . States pharmacy is telling her the medication was denied. Pleas advise.   Thank you

## 2023-11-10 ENCOUNTER — Other Ambulatory Visit: Payer: Self-pay | Admitting: Internal Medicine

## 2023-11-10 DIAGNOSIS — Z1231 Encounter for screening mammogram for malignant neoplasm of breast: Secondary | ICD-10-CM

## 2023-11-10 DIAGNOSIS — N63 Unspecified lump in unspecified breast: Secondary | ICD-10-CM

## 2023-11-10 DIAGNOSIS — Z853 Personal history of malignant neoplasm of breast: Secondary | ICD-10-CM

## 2023-11-15 ENCOUNTER — Ambulatory Visit

## 2023-11-15 ENCOUNTER — Ambulatory Visit
Admission: RE | Admit: 2023-11-15 | Discharge: 2023-11-15 | Disposition: A | Discharge status: Home / Self Care | Source: Ambulatory Visit | Attending: Internal Medicine | Admitting: Internal Medicine

## 2023-11-15 ENCOUNTER — Other Ambulatory Visit: Payer: Self-pay | Admitting: Internal Medicine

## 2023-11-15 DIAGNOSIS — R928 Other abnormal and inconclusive findings on diagnostic imaging of breast: Secondary | ICD-10-CM

## 2023-11-15 DIAGNOSIS — N63 Unspecified lump in unspecified breast: Secondary | ICD-10-CM

## 2023-11-15 DIAGNOSIS — Z1231 Encounter for screening mammogram for malignant neoplasm of breast: Secondary | ICD-10-CM

## 2023-11-15 DIAGNOSIS — Z853 Personal history of malignant neoplasm of breast: Secondary | ICD-10-CM
